# Patient Record
Sex: Female | Born: 1960 | Race: White | Hispanic: No | Marital: Single | Smoking: Never smoker
Health system: Southern US, Community
[De-identification: ages and names within clinical notes are randomized; demographics above are authoritative.]

## PROBLEM LIST (undated history)

## (undated) DIAGNOSIS — T4145XA Adverse effect of unspecified anesthetic, initial encounter: Secondary | ICD-10-CM

## (undated) DIAGNOSIS — M1990 Unspecified osteoarthritis, unspecified site: Secondary | ICD-10-CM

## (undated) DIAGNOSIS — Z8489 Family history of other specified conditions: Secondary | ICD-10-CM

## (undated) DIAGNOSIS — I82409 Acute embolism and thrombosis of unspecified deep veins of unspecified lower extremity: Secondary | ICD-10-CM

## (undated) DIAGNOSIS — N926 Irregular menstruation, unspecified: Secondary | ICD-10-CM

## (undated) DIAGNOSIS — Z8619 Personal history of other infectious and parasitic diseases: Secondary | ICD-10-CM

## (undated) DIAGNOSIS — F329 Major depressive disorder, single episode, unspecified: Secondary | ICD-10-CM

## (undated) DIAGNOSIS — R112 Nausea with vomiting, unspecified: Secondary | ICD-10-CM

## (undated) DIAGNOSIS — J45909 Unspecified asthma, uncomplicated: Secondary | ICD-10-CM

## (undated) DIAGNOSIS — L732 Hidradenitis suppurativa: Secondary | ICD-10-CM

## (undated) DIAGNOSIS — N951 Menopausal and female climacteric states: Secondary | ICD-10-CM

## (undated) DIAGNOSIS — B379 Candidiasis, unspecified: Secondary | ICD-10-CM

## (undated) DIAGNOSIS — I10 Essential (primary) hypertension: Secondary | ICD-10-CM

## (undated) DIAGNOSIS — L292 Pruritus vulvae: Secondary | ICD-10-CM

## (undated) DIAGNOSIS — Z9889 Other specified postprocedural states: Secondary | ICD-10-CM

## (undated) DIAGNOSIS — K219 Gastro-esophageal reflux disease without esophagitis: Secondary | ICD-10-CM

## (undated) DIAGNOSIS — C801 Malignant (primary) neoplasm, unspecified: Secondary | ICD-10-CM

## (undated) DIAGNOSIS — T8859XA Other complications of anesthesia, initial encounter: Secondary | ICD-10-CM

## (undated) DIAGNOSIS — F419 Anxiety disorder, unspecified: Secondary | ICD-10-CM

## (undated) DIAGNOSIS — F32A Depression, unspecified: Secondary | ICD-10-CM

## (undated) HISTORY — DX: Essential (primary) hypertension: I10

## (undated) HISTORY — DX: Adverse effect of unspecified anesthetic, initial encounter: T41.45XA

## (undated) HISTORY — DX: Personal history of other infectious and parasitic diseases: Z86.19

## (undated) HISTORY — DX: Pruritus vulvae: L29.2

## (undated) HISTORY — DX: Other complications of anesthesia, initial encounter: T88.59XA

## (undated) HISTORY — DX: Candidiasis, unspecified: B37.9

## (undated) HISTORY — DX: Irregular menstruation, unspecified: N92.6

## (undated) HISTORY — PX: TONSILLECTOMY: SUR1361

## (undated) HISTORY — DX: Menopausal and female climacteric states: N95.1

## (undated) HISTORY — DX: Hidradenitis suppurativa: L73.2

## (undated) HISTORY — PX: EYE MUSCLE SURGERY: SHX370

## (undated) HISTORY — PX: WISDOM TOOTH EXTRACTION: SHX21

---

## 2009-06-25 ENCOUNTER — Telehealth: Payer: Self-pay | Admitting: Internal Medicine

## 2009-06-27 DIAGNOSIS — I10 Essential (primary) hypertension: Secondary | ICD-10-CM | POA: Insufficient documentation

## 2009-06-28 ENCOUNTER — Encounter: Payer: Self-pay | Admitting: Nurse Practitioner

## 2009-06-28 ENCOUNTER — Ambulatory Visit: Payer: Self-pay | Admitting: Internal Medicine

## 2009-06-28 DIAGNOSIS — R1013 Epigastric pain: Secondary | ICD-10-CM | POA: Insufficient documentation

## 2009-06-28 DIAGNOSIS — R110 Nausea: Secondary | ICD-10-CM | POA: Insufficient documentation

## 2009-06-28 DIAGNOSIS — E669 Obesity, unspecified: Secondary | ICD-10-CM | POA: Insufficient documentation

## 2009-06-28 DIAGNOSIS — R197 Diarrhea, unspecified: Secondary | ICD-10-CM | POA: Insufficient documentation

## 2009-06-28 DIAGNOSIS — K219 Gastro-esophageal reflux disease without esophagitis: Secondary | ICD-10-CM | POA: Insufficient documentation

## 2009-07-01 LAB — CONVERTED CEMR LAB
Albumin: 3.7 g/dL (ref 3.5–5.2)
Alkaline Phosphatase: 65 units/L (ref 39–117)
BUN: 10 mg/dL (ref 6–23)
Basophils Relative: 0 % (ref 0.0–3.0)
CO2: 29 meq/L (ref 19–32)
Calcium: 9.4 mg/dL (ref 8.4–10.5)
Chloride: 97 meq/L (ref 96–112)
Eosinophils Relative: 0.3 % (ref 0.0–5.0)
Glucose, Bld: 99 mg/dL (ref 70–99)
Lipase: 16 units/L (ref 11.0–59.0)
Lymphocytes Relative: 16.6 % (ref 12.0–46.0)
Monocytes Absolute: 0.6 10*3/uL (ref 0.1–1.0)
Monocytes Relative: 5.8 % (ref 3.0–12.0)
Neutrophils Relative %: 77.3 % — ABNORMAL HIGH (ref 43.0–77.0)
Platelets: 342 10*3/uL (ref 150.0–400.0)
Potassium: 3.5 meq/L (ref 3.5–5.1)
RBC: 4.52 M/uL (ref 3.87–5.11)
Sodium: 135 meq/L (ref 135–145)
Total Protein: 7.2 g/dL (ref 6.0–8.3)
WBC: 10.7 10*3/uL — ABNORMAL HIGH (ref 4.5–10.5)

## 2009-08-13 ENCOUNTER — Ambulatory Visit: Payer: Self-pay | Admitting: Internal Medicine

## 2009-08-13 ENCOUNTER — Ambulatory Visit (HOSPITAL_COMMUNITY): Admission: RE | Admit: 2009-08-13 | Discharge: 2009-08-13 | Payer: Self-pay | Admitting: Internal Medicine

## 2009-08-14 ENCOUNTER — Encounter: Payer: Self-pay | Admitting: Internal Medicine

## 2009-11-12 ENCOUNTER — Emergency Department (HOSPITAL_COMMUNITY): Admission: EM | Admit: 2009-11-12 | Discharge: 2009-11-12 | Payer: Self-pay | Admitting: Emergency Medicine

## 2010-03-19 ENCOUNTER — Encounter: Payer: Self-pay | Admitting: Internal Medicine

## 2010-06-20 ENCOUNTER — Encounter
Admission: RE | Admit: 2010-06-20 | Discharge: 2010-06-20 | Payer: Self-pay | Source: Home / Self Care | Attending: Internal Medicine | Admitting: Internal Medicine

## 2010-07-02 DIAGNOSIS — N926 Irregular menstruation, unspecified: Secondary | ICD-10-CM

## 2010-07-02 HISTORY — DX: Irregular menstruation, unspecified: N92.6

## 2010-08-12 NOTE — Miscellaneous (Signed)
Summary: Nexium Refills  Clinical Lists Changes  Medications: Changed medication from NEXIUM 40 MG CPDR (ESOMEPRAZOLE MAGNESIUM) 1 by mouth once daily to NEXIUM 40 MG CPDR (ESOMEPRAZOLE MAGNESIUM) 1 by mouth once daily AS NEEDED - Signed Rx of NEXIUM 40 MG CPDR (ESOMEPRAZOLE MAGNESIUM) 1 by mouth once daily AS NEEDED;  #30 x 2;  Signed;  Entered by: Lamona Curl CMA (AAMA);  Authorized by: Hart Carwin MD;  Method used: Electronically to [facility]. #[id number]*, [address], [place], [place], [place]  [address], Ph: [phone], Fax: [phone]    Prescriptions: NEXIUM 40 MG CPDR (ESOMEPRAZOLE MAGNESIUM) 1 by mouth once daily AS NEEDED  #30 x 2   Entered by:   Lamona Curl CMA (AAMA)   Authorized by:   Hart Carwin MD   Signed by:   Lamona Curl CMA (AAMA) on 03/19/2010   Method used:   Electronically to        [facility]. [phone]* (retail)       [address]       [place], [place]  [address]       Ph: [phone]       Fax: [phone]   RxID:   [id number]

## 2010-08-12 NOTE — Letter (Signed)
Summary: Patient [facility] Biopsy Results  [facility]  [address] [place], [place] [address]   Phone: [phone]  Fax: [phone]        August 14, 2009 MRN: [id number]    [facility] [address] [place], [place]  [address]    Dear Ms. Lyter,  I am pleased to inform you that the biopsies taken during your recent endoscopic examination did not show any evidence of cancer upon pathologic examination. The stomach biopsies show mild gastritis ( inflammation)  Additional information/recommendations:  __No further action is needed at this time.  Please follow-up with      your primary care physician for your other healthcare needs.  __ Please call [phone] to schedule a return visit to review      your condition.  __x Continue with the treatment plan as outlined on the day of your      exam.  _   Please call us if you are having persistent problems or have questions about your condition that have not been fully answered at this time.  Sincerely,  Hart Carwin MD  This letter has been electronically signed by your physician.  Appended Document: Patient Notice-Endo Biopsy Results Letter mailed to patient.

## 2010-08-12 NOTE — Procedures (Signed)
Summary: Upper Endoscopy  Patient: Kendra Haney Note: All result statuses are Final unless otherwise noted.  Tests: (1) Upper Endoscopy (EGD)   EGD Upper Endoscopy       DONE     [facility]     [address] [place], [place]  [address]           ENDOSCOPY PROCEDURE REPORT           PATIENT:  Kendra, Haney  MR#:  [id number]     BIRTHDATE:  [birth year], 48 yrs. old  GENDER:  female           ENDOSCOPIST:  Hedwig Morton. Juanda Chance, MD     Referred by:  Louanna Raw, [name]           PROCEDURE DATE:  08/13/2009     PROCEDURE:  EGD with biopsy     ASA CLASS:  Class I     INDICATIONS:  abdominal pain epigastric pain x 1 year, refractory     to PPI's, now relieved with Nexiem, abd. sono negative           MEDICATIONS:   Versed 10 mg, Fentanyl 100 mcg     TOPICAL ANESTHETIC:  Cetacaine Spray           DESCRIPTION OF PROCEDURE:   After the risks benefits and     alternatives of the procedure were thoroughly explained, informed     consent was obtained.  The EG-[id number] ([id number]) endoscope was     introduced through the mouth and advanced to the second portion of     the duodenum, without limitations.  The instrument was slowly     withdrawn as the mucosa was fully examined.     <<PROCEDUREIMAGES>>           There were multiple polyps identified. multiple fundic gland     polyps Multiple biopsies were obtained and sent to pathology (see     image005).  Mild gastritis was found in the antrum. scattered     prepyloric erosions With standard forceps, a biopsy was obtained     and sent to pathology (see image002).  Otherwise the examination     was normal (see image006, image004, image003, and image001).     Retroflexed views revealed no abnormalities.    The scope was then     withdrawn from the patient and the procedure completed.     COMPLICATIONS:  None           ENDOSCOPIC IMPRESSION:     1) Polyps, multiple     2) Mild gastritis in the antrum     3) Otherwise normal  examination     RECOMMENDATIONS:     1) await biopsy results     continue Nexiwm 40 mg po qd, may try to stop after several month     and take it prn           REPEAT EXAM:  In 0 year(s) for.           ______________________________     Hedwig Morton. Juanda Chance, MD           CC:           n.     eSIGNED:   Hedwig Morton. Kamesha Herne at 08/13/2009 10:22 AM           [name], [id number]  Note: An exclamation mark (!) indicates a result that was not dispersed into the  flowsheet. Document Creation Date: 08/13/2009 10:22 AM _______________________________________________________________________  (1) Order result status: Final Collection or observation date-time: 08/13/2009 10:16 Requested date-time:  Receipt date-time:  Reported date-time:  Referring Physician:   Ordering Physician: Lina Sar [phone]) Specimen Source:  Source: Launa Grill Order Number: [id number] Lab site:

## 2010-09-30 LAB — BASIC METABOLIC PANEL
CO2: 29 mEq/L (ref 19–32)
Chloride: 102 mEq/L (ref 96–112)
GFR calc Af Amer: >60 mL/min (ref >60)
Glucose, Bld: 103 mg/dL — ABNORMAL HIGH (ref 70–99)
Sodium: 139 mEq/L (ref 135–145)

## 2010-09-30 LAB — DIFFERENTIAL
Basophils Relative: 0 % (ref 0–1)
Eosinophils Absolute: 0 10*3/uL (ref 0.0–0.7)
Eosinophils Relative: 0 % (ref 0–5)
Monocytes Absolute: 0.5 10*3/uL (ref 0.1–1.0)
Monocytes Relative: 4 % (ref 3–12)

## 2010-09-30 LAB — CBC
Hemoglobin: 13.4 g/dL (ref 12.0–15.0)
MCHC: 33.3 g/dL (ref 30.0–36.0)
MCV: 86.6 fL (ref 78.0–100.0)
RBC: 4.65 MIL/uL (ref 3.87–5.11)

## 2010-09-30 LAB — URINALYSIS, ROUTINE W REFLEX MICROSCOPIC
Bilirubin Urine: NEGATIVE
Glucose, UA: NEGATIVE mg/dL
Hgb urine dipstick: NEGATIVE
Protein, ur: NEGATIVE mg/dL
Specific Gravity, Urine: 1.017 (ref 1.005–1.030)

## 2010-10-14 DIAGNOSIS — N951 Menopausal and female climacteric states: Secondary | ICD-10-CM

## 2010-10-14 HISTORY — DX: Menopausal and female climacteric states: N95.1

## 2010-12-03 ENCOUNTER — Other Ambulatory Visit: Payer: Self-pay | Admitting: *Deleted

## 2010-12-03 MED ORDER — ESOMEPRAZOLE MAGNESIUM 40 MG PO CPDR: 40.0000 mg | DELAYED_RELEASE_CAPSULE | Freq: Every day | ORAL | Status: DC

## 2011-03-18 ENCOUNTER — Ambulatory Visit: Payer: Self-pay | Admitting: Internal Medicine

## 2011-04-14 DIAGNOSIS — L732 Hidradenitis suppurativa: Secondary | ICD-10-CM

## 2011-04-14 DIAGNOSIS — L292 Pruritus vulvae: Secondary | ICD-10-CM

## 2011-04-14 HISTORY — DX: Hidradenitis suppurativa: L73.2

## 2011-04-14 HISTORY — DX: Pruritus vulvae: L29.2

## 2011-05-13 ENCOUNTER — Other Ambulatory Visit: Payer: Self-pay | Admitting: Internal Medicine

## 2011-05-13 NOTE — Telephone Encounter (Signed)
rx sent

## 2011-10-14 ENCOUNTER — Other Ambulatory Visit: Payer: Self-pay

## 2011-10-14 DIAGNOSIS — N921 Excessive and frequent menstruation with irregular cycle: Secondary | ICD-10-CM

## 2011-11-09 ENCOUNTER — Other Ambulatory Visit: Payer: Self-pay | Admitting: Obstetrics and Gynecology

## 2011-11-09 ENCOUNTER — Other Ambulatory Visit: Payer: Self-pay

## 2011-11-09 ENCOUNTER — Ambulatory Visit (INDEPENDENT_AMBULATORY_CARE_PROVIDER_SITE_OTHER): Payer: 59 | Admitting: Obstetrics and Gynecology

## 2011-11-09 ENCOUNTER — Encounter: Payer: Self-pay | Admitting: Obstetrics and Gynecology

## 2011-11-09 ENCOUNTER — Ambulatory Visit (INDEPENDENT_AMBULATORY_CARE_PROVIDER_SITE_OTHER): Payer: 59

## 2011-11-09 VITALS — BP 120/76 | Ht 70.0 in | Wt 360.0 lb

## 2011-11-09 DIAGNOSIS — N943 Premenstrual tension syndrome: Secondary | ICD-10-CM

## 2011-11-09 DIAGNOSIS — N921 Excessive and frequent menstruation with irregular cycle: Secondary | ICD-10-CM

## 2011-11-09 NOTE — Progress Notes (Signed)
Pt states bleeding has improved.  She still occ has anxiety BP 120/76  Ht 5\' 10"  (1.778 m)  Wt 360 lb (163.295 kg)  BMI 51.65 kg/m2  LMP 10/26/2011 Physical Examination: General appearance - alert, well appearing, and in no distress Chest - clear to auscultation, no wheezes, rales or rhonchi, symmetric air entry Heart - normal rate, regular rhythm, normal S1, S2, no murmurs, rubs, clicks or gallops Abdomen - soft, nontender, nondistended, no masses or organomegaly Musculoskeletal - no joint tenderness, deformity or swelling Extremities - peripheral pulses normal, no pedal edema, no clubbing or cyanosis PMS H/o irregular bleeding Pt doing well on OCPS US WNL Follow up with PCP about anxiety Diet and exercise discussed

## 2011-12-01 ENCOUNTER — Other Ambulatory Visit: Payer: Self-pay | Admitting: Internal Medicine

## 2011-12-22 ENCOUNTER — Telehealth: Payer: Self-pay | Admitting: Obstetrics and Gynecology

## 2011-12-22 NOTE — Telephone Encounter (Signed)
Triage/epic 

## 2011-12-22 NOTE — Telephone Encounter (Signed)
TC from pt.   States had been diagnosed with DVT today, having 3 clots.  Unsure if due to OCP's or recent injections in knee.  Has D/C'd OCP.   States was taking for perimenopausal sx and irreg menses.   Taking continuously x 3 months.  Questioning if will have heavier menses since will be on blood thinner, and what she can take for menopausal sx.   Per DR ND, informed may have heavier menses.   To Call if bleeding > pad/hr.  Sched w/Dr ND 12/28/11. Pt verbalizes comprehension.

## 2011-12-28 ENCOUNTER — Encounter: Payer: Self-pay | Admitting: Obstetrics and Gynecology

## 2011-12-28 ENCOUNTER — Ambulatory Visit (INDEPENDENT_AMBULATORY_CARE_PROVIDER_SITE_OTHER): Payer: 59 | Admitting: Obstetrics and Gynecology

## 2011-12-28 VITALS — BP 130/76 | Wt 352.0 lb

## 2011-12-28 DIAGNOSIS — N943 Premenstrual tension syndrome: Secondary | ICD-10-CM

## 2011-12-28 DIAGNOSIS — I82409 Acute embolism and thrombosis of unspecified deep veins of unspecified lower extremity: Secondary | ICD-10-CM

## 2011-12-28 DIAGNOSIS — O2230 Deep phlebothrombosis in pregnancy, unspecified trimester: Secondary | ICD-10-CM

## 2011-12-28 NOTE — Progress Notes (Signed)
Pt states she stopped taking bc pills due to blood clots in her legs and would like to discuss other options for hormone balance.  Pt was on ocps to control DUB and PMS.  Last week she was diagnosed with a DVT.  She denied having any recent travel, h/o VTE, or leg trauma Pt also concerned about 4 pound weight gain in the last week.   Physical Examination: LE right calf with erythema and swelling.  Left leg with mild swelling.   PMS Right DVT DUB Swelling and fluid retention Pt told she should not take hormones.  We reviewed herbal remedies and exerise for PMS Pt on lovenox and coumadin.   Pt has not had any irregular bleeding.  She was told she could try Mirena if okd by hematology, ablation or hysterectomy Pt chose observation for now I spoke with Dr Allyne Gee.  She recommends the pt take HCTZ QD. Pt declined hematology consult at this time

## 2011-12-28 NOTE — Patient Instructions (Signed)
Patient Education Materials to be provided at check out (*indicates is located in accordion folder):  *Mirena, Endometrial Ablation, Herbal Products

## 2012-01-05 ENCOUNTER — Telehealth: Payer: Self-pay | Admitting: Hematology and Oncology

## 2012-01-05 NOTE — Telephone Encounter (Signed)
S/w pt re appt for 7/9. °

## 2012-01-07 ENCOUNTER — Telehealth: Payer: Self-pay | Admitting: Hematology and Oncology

## 2012-01-07 NOTE — Telephone Encounter (Signed)
Referred by Bebe Liter, FNP, Dx-Coag W/U

## 2012-01-19 ENCOUNTER — Encounter: Payer: Self-pay | Admitting: Hematology and Oncology

## 2012-01-19 ENCOUNTER — Ambulatory Visit (HOSPITAL_BASED_OUTPATIENT_CLINIC_OR_DEPARTMENT_OTHER): Payer: Self-pay | Admitting: Lab

## 2012-01-19 ENCOUNTER — Ambulatory Visit (HOSPITAL_BASED_OUTPATIENT_CLINIC_OR_DEPARTMENT_OTHER): Payer: Self-pay | Admitting: Hematology and Oncology

## 2012-01-19 ENCOUNTER — Ambulatory Visit: Payer: Self-pay

## 2012-01-19 VITALS — BP 131/59 | HR 78 | Temp 97.1°F | Ht 70.0 in | Wt 351.7 lb

## 2012-01-19 DIAGNOSIS — Z7901 Long term (current) use of anticoagulants: Secondary | ICD-10-CM

## 2012-01-19 DIAGNOSIS — I82409 Acute embolism and thrombosis of unspecified deep veins of unspecified lower extremity: Secondary | ICD-10-CM

## 2012-01-19 LAB — CBC WITH DIFFERENTIAL/PLATELET
BASO%: 0.3 % (ref 0.0–2.0)
EOS%: 0.8 % (ref 0.0–7.0)
HGB: 11.9 g/dL (ref 11.6–15.9)
MCH: 27.6 pg (ref 25.1–34.0)
MCHC: 32.6 g/dL (ref 31.5–36.0)
MONO#: 0.4 10*3/uL (ref 0.1–0.9)
RDW: 13.6 % (ref 11.2–14.5)
WBC: 7.3 10*3/uL (ref 3.9–10.3)
lymph#: 2 10*3/uL (ref 0.9–3.3)

## 2012-01-19 LAB — PROTIME-INR
INR: 2.2 (ref 2.00–3.50)
Protime: 26.4 Seconds — ABNORMAL HIGH (ref 10.6–13.4)

## 2012-01-19 NOTE — Progress Notes (Signed)
CC:   Robyn N. Allyne Gee, [name]  IDENTIFYING STATEMENT:  The patient is a 51 year old woman seen at request of Dr. Allyne Gee with deep vein thrombosis.  HISTORY OF PRESENT ILLNESS:  The patient reports that early last month she had woken up with redness and swelling of her right lower leg.  She was evaluated by Dr. Allyne Gee and on 12/22/2011 a lower extremity Doppler, which I do not have results of, had documented deep vein thrombosis.  The patient tells me that she had labs drawn and was placed on Lovenox twice daily and subsequently bridged to Coumadin.  The patient notes that whilst on Lovenox, swelling and redness had decreased.  However, more lately she noted recurrence of some of her symptoms.  She tells me that INR has been erratic, swinging from highs to lows despite adjustments.  She has not had any evidence of bleeding. She denies chest pain, shortness of breath or lightheadedness.  She was on the oral contraceptive pill for a year and a half for premenopausal symptoms and dysfunctional uterine bleeding.  This has since been discontinued.  Her risk factors also include obesity and immobility. Denies a past history or family history for thrombosis.  She had what appears to be a limited hypercoagulable panel performed at Dr. Kellie Moor office which essentially was unremarkable.  She is attempting to lose weight and has lost 100 pounds over a 2 year period.  She denies rectal Bleeding or changes in bowel function.  PAST MEDICAL HISTORY: 1. Has status post repair of left strabismus. 2. Status post tonsillectomy. 3. Hypertension.  ALLERGIES:  Doxycycline and tetracycline.  MEDICATIONS: 1. Xanax 1 mg as needed. 2. Tylenol. 3. Wellbutrin 150 mg 2 tablets daily. 4. BuSpar 50 mg 3 times daily. 5. Nexium 40 mg daily. 6. Allegra 180 mg daily. 7. Flonase 50 mcg 2 sprays in nostrils as needed. 8. Microzide 12.5 mg daily. 9. Vicodin 1 tablet q.6 p.r.n. daily. 10.Avalide 300/12.5 one  tablet daily. 11.Desyrel 100 mg as needed. 12.Coumadin dosed at Dr. Kellie Moor office.  SOCIAL HISTORY:  The patient is single.  She does not have children. Denies alcohol, tobacco use.  She works with Psychologist, forensic.  FAMILY HISTORY:  The patient's father had carcinoma of bile ducts. Negative family history for thrombosis.  REVIEW OF SYSTEMS:  Denies fever, chills, night sweats, anorexia, weight loss.  Cardiovascular:  Denies chest pain, PND, orthopnea, ankle swelling.  Respiratory:  Denies cough, hemoptysis, wheeze, shortness of breath.  GI:  Denies nausea, vomiting, abdominal pain, diarrhea, melena, hematochezia.  GU:  Denies dysuria, nocturia, frequency.  Skin:  No bleeding or bruising.  Neurologic:  Denies headaches, vision changes, extremity weakness.  The rest of review of systems negative except for minimal right lower extremity swelling with discomfort on ambulation.  PHYSICAL EXAM:  General:  The patient is a well-appearing, well- nourished woman in no current distress.  Vitals:  Pulse 78, blood pressure 131/59, temperature 97.1, respirations 20, weight is 351.7 pounds.  HEENT:  Head is atraumatic, normocephalic.  Sclerae anicteric. Mouth moist.  Neck:  Supple.  Chest:  Clear to percussion and auscultation.  CVS:  First and second heart sounds present with no added sounds or murmurs.  Abdomen:  Obese, soft.  No palpable hepatomegaly. Bowel sounds present.  Extremities:  No edema.  Pulses present and symmetrical.  Skin:  No bruising.  CNS:  Nonfocal.  No palpable adenopathy.  IMPRESSION AND PLAN:  Ms. Siddoway is a 51 year old woman who was recently diagnosed with  a provoked right lower extremity deep vein thrombosis on 12/22/2011 following Doppler studies.  She was commenced on Lovenox which was bridged to Coumadin. Presently remains on coumadin. INRs have been erratic.  Her risk factors includes hormonal therapy which has since discontinued, inactive lifestyle, and  obesity.  As a result of her subtherapeutic INRs and the fact that she has had recurrence of some of her symptoms with right lower extremity swelling I think she would be a candidate for Xarelto.  She does not have any forms of active pathological bleeding.  RECOMMENDATIONS:  A starting dose of 15 mg twice daily for 2 weeks followed by 20 mg once daily for total of 6 months.  If she has financial constraints we would have her go back on Lovenox 1.5 mg/kg daily with Coumadin discontinuing Lovenox when INR remains stable with a therapeutic goal between 2 and 3.  After 6 months of anticoagulation would repeat another Doppler.  If negative would discontinue Coumadin and after a period of 3 weeks obtain a full hypercoagulable panel to evaluate hereditary component.  I am quite happy to do that at this office.  In the interim, the patient will return to lab and will check a CBC, CMET and coagulation panel.  Will have our patient coordinator see if she is able to afford Xarelto.  If so, will write a prescription.  She will follow up with Dr. Allyne Gee within the next week or so.  She plans to follow back with [place] in 6 months' time.    ______________________________ Laurice Record, [name] LIO/MEDQ  D:  01/19/2012  T:  01/19/2012  Job:  [id number]

## 2012-01-19 NOTE — Patient Instructions (Signed)
Kendra Haney  [id number]  [facility] Discharge Instructions  RECOMMENDATIONS MADE BY THE CONSULTANT AND ANY TEST RESULTS WILL BE SENT TO YOUR REFERRING DOCTOR.   EXAM FINDINGS BY MD TODAY AND SIGNS AND SYMPTOMS TO REPORT TO CLINIC OR PRIMARY MD:   Your current list of medications are: Current Outpatient Prescriptions  Medication Sig Dispense Refill  . acetaminophen (TYLENOL) 500 MG tablet Take 500 mg by mouth every 6 (six) hours as needed.      Marland Kitchen adapalene (DIFFERIN) 0.1 % cream Apply topically at bedtime.      . ALPRAZolam (XANAX) 1 MG tablet Take 1 mg by mouth as needed.      Marland Kitchen b complex vitamins tablet Take 1 tablet by mouth daily.      Marland Kitchen buPROPion (WELLBUTRIN XL) 150 MG 24 hr tablet Take 150 mg by mouth daily. 01/19/12- Pt takes 3 tabs daily.      . busPIRone (BUSPAR) 15 MG tablet Take 15 mg by mouth 3 (three) times daily.      . fexofenadine (ALLEGRA) 180 MG tablet Take 180 mg by mouth daily.      . fluticasone (FLONASE) 50 MCG/ACT nasal spray Place 2 sprays into the nose as needed.      . hydrochlorothiazide (MICROZIDE) 12.5 MG capsule Take 12.5 mg by mouth daily.      . Hydrocodone-Acetaminophen 5-300 MG TABS Take by mouth as needed.      . irbesartan-hydrochlorothiazide (AVALIDE) 300-12.5 MG per tablet Take 1 tablet by mouth daily.      Marland Kitchen NEXIUM 40 MG capsule TAKE 1 CAPSULE DAILY  90 capsule  1  . traZODone (DESYREL) 100 MG tablet Take 100 mg by mouth as needed.      . WARFARIN SODIUM PO Take by mouth daily. As directed         INSTRUCTIONS GIVEN AND DISCUSSED:   SPECIAL INSTRUCTIONS/FOLLOW-UP:  See above.  I acknowledge that I have been informed and understand all the instructions given to me and received a copy. I do not have any more questions at this time, but understand that I may call the [facility] at [phone] during business hours should I have any further questions or need assistance in obtaining follow-up care.

## 2012-01-19 NOTE — Progress Notes (Signed)
This office note has been dictated.

## 2012-01-19 NOTE — Progress Notes (Signed)
Patient came in today as a new patient and she has [employer] insurance,she said she think she will be [place] as far as financial assistance.I did give her Terald Sleeper card just in case she had any questions she can call her.

## 2012-01-21 ENCOUNTER — Other Ambulatory Visit: Payer: Self-pay | Admitting: *Deleted

## 2012-01-21 DIAGNOSIS — I82409 Acute embolism and thrombosis of unspecified deep veins of unspecified lower extremity: Secondary | ICD-10-CM

## 2012-01-21 MED ORDER — RIVAROXABAN 15 MG PO TABS: 15.0000 mg | ORAL_TABLET | Freq: Two times a day (BID) | ORAL | Status: DC

## 2012-01-21 MED ORDER — RIVAROXABAN 20 MG PO TABS: 20.0000 mg | ORAL_TABLET | Freq: Every day | ORAL | Status: DC

## 2012-01-22 NOTE — Progress Notes (Signed)
Sent Rx for Xarelto 15 mg to [facility]. They will have it by 01/20/12.  I called [facility] [phone] and spoke to Kiana and no pre-cert is needed.

## 2012-01-25 ENCOUNTER — Telehealth: Payer: Self-pay | Admitting: Hematology and Oncology

## 2012-01-25 ENCOUNTER — Other Ambulatory Visit: Payer: Self-pay | Admitting: *Deleted

## 2012-01-25 NOTE — Telephone Encounter (Signed)
s/w pt and moved 8/1 appt to am per dr lo,pt aware  aom

## 2012-01-26 ENCOUNTER — Telehealth: Payer: Self-pay | Admitting: *Deleted

## 2012-01-26 NOTE — Telephone Encounter (Signed)
Faxed response from Dr. Dalene Carrow  For clarification re:  Pt to take  Xarelto   15 mg  BID  For  3  Weeks  To  [facility] Fax    [phone] .

## 2012-02-04 ENCOUNTER — Telehealth: Payer: Self-pay | Admitting: *Deleted

## 2012-02-04 ENCOUNTER — Other Ambulatory Visit: Payer: Self-pay | Admitting: *Deleted

## 2012-02-04 ENCOUNTER — Ambulatory Visit (HOSPITAL_BASED_OUTPATIENT_CLINIC_OR_DEPARTMENT_OTHER): Payer: Self-pay | Admitting: Lab

## 2012-02-04 DIAGNOSIS — I82409 Acute embolism and thrombosis of unspecified deep veins of unspecified lower extremity: Secondary | ICD-10-CM

## 2012-02-04 LAB — CBC WITH DIFFERENTIAL/PLATELET
BASO%: 0.1 % (ref 0.0–2.0)
Basophils Absolute: 0 10*3/uL (ref 0.0–0.1)
EOS%: 0.7 % (ref 0.0–7.0)
MCH: 27.3 pg (ref 25.1–34.0)
MCHC: 32.8 g/dL (ref 31.5–36.0)
MCV: 83.4 fL (ref 79.5–101.0)
MONO%: 6.3 % (ref 0.0–14.0)
RBC: 4.21 10*6/uL (ref 3.70–5.45)
RDW: 13.7 % (ref 11.2–14.5)

## 2012-02-04 NOTE — Telephone Encounter (Signed)
Received call from pt informing nurse re: pt has been taking Xarelto 15 mg BID  Since  01/19/12.    Pt noticed red rectal bleeding when wiped and small clot.   Pt stated she is also on her period but pt uses tampons.   Pt is concerned. Spoke with Herbert Seta, pharmacist.  Per  Herbert Seta,  Pt can continue to monitor for any more bleeding episodes.  If bleeding continues, pt should go to ER for evaluation - since no meds to reverse Xarelto effects.    Dr. Arline Asp notified. Pt's  Work  [employer]   [phone].

## 2012-02-04 NOTE — Telephone Encounter (Signed)
Spoke with pt and informed pt re:  Per Dr. Arline Asp,  Pt needs to have CBC rechecked.   Pt informed nurse that she thought bleeding was from her heavy period - since pt had gotten off hormone meds.  Pt stated she was not that concerned now but will come in for lab as instructed.  Gave pt appt date and time for lab today at 2 pm.  Pt voiced understanding.

## 2012-02-11 ENCOUNTER — Ambulatory Visit (HOSPITAL_BASED_OUTPATIENT_CLINIC_OR_DEPARTMENT_OTHER): Payer: 59 | Admitting: Family

## 2012-02-11 ENCOUNTER — Telehealth: Payer: Self-pay | Admitting: Hematology and Oncology

## 2012-02-11 ENCOUNTER — Encounter: Payer: Self-pay | Admitting: Family

## 2012-02-11 VITALS — BP 110/64 | HR 67 | Temp 97.5°F | Ht 70.0 in | Wt 350.5 lb

## 2012-02-11 DIAGNOSIS — I82409 Acute embolism and thrombosis of unspecified deep veins of unspecified lower extremity: Secondary | ICD-10-CM

## 2012-02-11 NOTE — Patient Instructions (Signed)
Patient ID: Kendra Haney,   DOB: [birth year],  MRN: [id number]   [facility] Discharge Instructions  RECOMMENDATIONS MAD BY THE CONSULTANT AND ANY TEST RESULT(S) WILL BE FORWARDED TO YOU REFERRING DOCTOR   EXAM FINDINGS BY NURSE PRACTITIONER TODAY TO REPORT TO THE CLINIC OR PRIMARY PROVIDER: N/A   Your Current Medications Are: Current Outpatient Prescriptions  Medication Sig Dispense Refill  . acetaminophen (TYLENOL) 500 MG tablet Take 500 mg by mouth every 6 (six) hours as needed.      Marland Kitchen adapalene (DIFFERIN) 0.1 % cream Apply topically at bedtime.      . ALPRAZolam (XANAX) 1 MG tablet Take 1 mg by mouth as needed.      Marland Kitchen b complex vitamins tablet Take 1 tablet by mouth daily.      Marland Kitchen buPROPion (WELLBUTRIN XL) 150 MG 24 hr tablet Take 150 mg by mouth daily. 01/19/12- Pt takes 3 tabs daily.      . busPIRone (BUSPAR) 15 MG tablet Take 15 mg by mouth 3 (three) times daily.      . fexofenadine (ALLEGRA) 180 MG tablet Take 180 mg by mouth daily.      . fluticasone (FLONASE) 50 MCG/ACT nasal spray Place 2 sprays into the nose as needed.      . hydrochlorothiazide (MICROZIDE) 12.5 MG capsule Take 12.5 mg by mouth daily.      . Hydrocodone-Acetaminophen 5-300 MG TABS Take by mouth as needed.      . irbesartan-hydrochlorothiazide (AVALIDE) 300-12.5 MG per tablet Take 1 tablet by mouth daily.      Marland Kitchen NEXIUM 40 MG capsule TAKE 1 CAPSULE DAILY  90 capsule  1  . Rivaroxaban (XARELTO) 20 MG TABS Take 1 tablet (20 mg total) by mouth daily. Start  After  15 mg  BID  X  3  Weeks.  30 tablet  5  . traZODone (DESYREL) 100 MG tablet Take 100 mg by mouth as needed.      Marland Kitchen DISCONTD: Rivaroxaban (XARELTO) 15 MG TABS tablet Take 1 tablet (15 mg total) by mouth 2 (two) times daily. Take 15 mg po  BID  For  3  Weeks..  42 tablet  0     INSTRUCTIONS GIVEN, DISCUSSED AND FOLLOW-UP: If driving to [place] stop often to move your legs and increase circulation.  Consider purchasing and wearing compression  stockings for long trips (at least knee length, hopefully thigh length).  I acknowledge that I have been informed and understand all the instructions given to me and have received a copy.  I do not have any further questions at this time, but I understand that I may call the [facility] at [phone] during business hours should I have any further questions or need assistance in obtaining follow-up care.   02/11/2012, 9:27 AM

## 2012-02-11 NOTE — Progress Notes (Signed)
Patient ID: Kendra Haney, female   DOB: 1961-04-21, 51 y.o.   MRN: [id number] CSN: [id number]  [name], [name]  Identifying Statement: Kendra Haney is a 51 y.o. Caucasion female who presents for follow-up of RLE deep vein thrombosis.   Interval History: The patient reports that she is doing well and the pain in her RLE has subsided.  The patient states that she is taking Xarelto and just switched to the 20 mg dosage yesterday.  The patient states that she has ongoing bilateral knee pain, but she is working with an orthopedist for this issue and is aware that she will most likely need bilateral knee replacements.  The patient is planning a driving trip to see her mother in [place] soon.  Explained to the patient that compression stockings and frequent rest stops to keep circulation flowing would be a good idea.   The patient asked about any drug interactions between Xarelto and [place]ra [place] - none noted by Kendra Haney.  The patient stated that she has been having dental pain where she has a crown and may need to have dental work completed soon.  Asked the patient to notify our office if a dental procedure is scheduled so that anti-coagulation therapy can be discussed.  The patient does not have any further complaints and denies any symptomatology.  Medications: Current Outpatient Prescriptions  Medication Sig Dispense Refill  . acetaminophen (TYLENOL) 500 MG tablet Take 500 mg by mouth every 6 (six) hours as needed.      Marland Kitchen adapalene (DIFFERIN) 0.1 % cream Apply topically at bedtime.      . ALPRAZolam (XANAX) 1 MG tablet Take 1 mg by mouth as needed.      Marland Kitchen b complex vitamins tablet Take 1 tablet by mouth daily.      Marland Kitchen buPROPion (WELLBUTRIN XL) 150 MG 24 hr tablet Take 150 mg by mouth daily. 01/19/12- Pt takes 3 tabs daily.      . busPIRone (BUSPAR) 15 MG tablet Take 15 mg by mouth 3 (three) times daily.      . fexofenadine (ALLEGRA) 180 MG tablet Take 180 mg by mouth daily.      . fluticasone  (FLONASE) 50 MCG/ACT nasal spray Place 2 sprays into the nose as needed.      . hydrochlorothiazide (MICROZIDE) 12.5 MG capsule Take 12.5 mg by mouth daily.      . Hydrocodone-Acetaminophen 5-300 MG TABS Take by mouth as needed.      . irbesartan-hydrochlorothiazide (AVALIDE) 300-12.5 MG per tablet Take 1 tablet by mouth daily.      Marland Kitchen NEXIUM 40 MG capsule TAKE 1 CAPSULE DAILY  90 capsule  1  . Rivaroxaban (XARELTO) 20 MG TABS Take 1 tablet (20 mg total) by mouth daily. Start  After  15 mg  BID  X  3  Weeks.  30 tablet  5  . traZODone (DESYREL) 100 MG tablet Take 100 mg by mouth as needed.      Marland Kitchen DISCONTD: Rivaroxaban (XARELTO) 15 MG TABS tablet Take 1 tablet (15 mg total) by mouth 2 (two) times daily. Take 15 mg po  BID  For  3  Weeks..  42 tablet  0    Allergies  Allergen Reactions  . Doxycycline Nausea And Vomiting  . Dust Mite Extract   . Mold Extract (Trichophyton Mentagrophyte)   . Tetracyclines & Related Nausea And Vomiting     Family History: Family History  Problem Relation Age of Onset  . Hypertension Paternal  Grandmother   . Cancer Father   . Hypertension Mother     Social History: History  Substance Use Topics  . Smoking status: Never Smoker   . Smokeless tobacco: Never Used  . Alcohol Use: No    Review of Systems: As noted above.  10 point review of systems was completed.   Physical Exam: Blood pressure 110/64, pulse 67, temperature 97.5 F (36.4 C), temperature source Oral, height 5\' 10"  (1.778 m), weight 350 lb 8 oz (158.986 kg).  General appearance: Alert, cooperative, well developed, well nourished, morbidly obese,  no distress Head: Normocephalic, without obvious abnormality, atraumatic Eyes: Conjunctivae/corneas clear, PERRLA, EOMI Throat: Lips, mucosa, tongue and gums are noted, dental caries present Neck: no adenopathy, supple, symmetrical, trachea midline, thyroid not enlarged, symmetric, no tenderness/mass/nodules and excessive body habitus Back:  symmetric, no curvature. ROM normal. No CVA tenderness. Resp: clear to auscultation bilaterally Cardio: regular rate and rhythm, S1, S2 normal, no murmur, click, rub or gallop GI: soft, non-tender; bowel sounds normal; no masses,  no organomegaly Extremities: extremities normal, atraumatic, no cyanosis or edema Pulses: 1+ bilateral LEs   Laboratory Data: WBCs 7.3, neutrophils 4.9, hemoglobin 11.5, hematocrit 35.1, platelets 258, MCV 83.4, MCH 27.3, MCHC 32.8, RBCs 4.2, RDW 13.7   Impression/Plan: The patient is asked to follow up with her PCP Dr. Allyne Haney soon and begin receiving prescriptions for Xarelto from her PCP.  The patient is also asked to report any unusual signs of bleeding or bruising immediately.  The patient will continue taking Xarelto 20 mg PO daily until her follow-up visit in our office scheduled for February, 2014.  The patient will also have a follow-up LE venous doppler and CBC at that time.  Anticoagulation therapy is scheduled to continue until July, 2014 - one year from onset of DVT.  Kendra Pons NP-C 02/11/2012, 9:33 AM

## 2012-02-11 NOTE — Telephone Encounter (Signed)
appts made and printed for pt aom °

## 2012-05-09 ENCOUNTER — Other Ambulatory Visit: Payer: Self-pay | Admitting: Internal Medicine

## 2012-05-09 DIAGNOSIS — Z1231 Encounter for screening mammogram for malignant neoplasm of breast: Secondary | ICD-10-CM

## 2012-05-19 ENCOUNTER — Ambulatory Visit
Admission: RE | Admit: 2012-05-19 | Discharge: 2012-05-19 | Disposition: A | Discharge status: Home / Self Care | Payer: 59 | Source: Ambulatory Visit | Attending: Internal Medicine | Admitting: Internal Medicine

## 2012-05-19 DIAGNOSIS — Z1231 Encounter for screening mammogram for malignant neoplasm of breast: Secondary | ICD-10-CM

## 2012-07-16 ENCOUNTER — Encounter: Payer: Self-pay | Admitting: Oncology

## 2012-07-16 ENCOUNTER — Telehealth: Payer: Self-pay | Admitting: Oncology

## 2012-07-16 NOTE — Telephone Encounter (Signed)
l/m for her to call but went ahead and reassign appt,print new sch and mailed letter       anne 07/16/12

## 2012-07-18 ENCOUNTER — Telehealth: Payer: Self-pay | Admitting: Hematology and Oncology

## 2012-07-18 NOTE — Telephone Encounter (Signed)
pt called me back and i ret. her call and lerft her a vm that i will call her back later this afternoon    anne

## 2012-08-10 ENCOUNTER — Telehealth: Payer: Self-pay | Admitting: *Deleted

## 2012-08-10 NOTE — Telephone Encounter (Signed)
Received call from pt stating that she has enough xarelto to get through end of week & will have taken for 6 mo & thought she was to d/c after that.  Per NP Annice Pih Hunter's note, pt to take for 1 year & was supposed to f/u with her PCP to prescribe.  Encouraged pt to call her PCP for refill & to cont until discussed with MD/ML at [facility] visit.  She was OK with this.

## 2012-08-18 ENCOUNTER — Ambulatory Visit: Payer: 59 | Admitting: Hematology and Oncology

## 2012-08-18 ENCOUNTER — Ambulatory Visit (HOSPITAL_COMMUNITY)
Admission: RE | Admit: 2012-08-18 | Discharge: 2012-08-18 | Disposition: A | Discharge status: Home / Self Care | Payer: 59 | Source: Ambulatory Visit | Attending: Oncology | Admitting: Oncology

## 2012-08-18 ENCOUNTER — Other Ambulatory Visit: Payer: 59 | Admitting: Lab

## 2012-08-18 DIAGNOSIS — Z86718 Personal history of other venous thrombosis and embolism: Secondary | ICD-10-CM | POA: Insufficient documentation

## 2012-08-18 DIAGNOSIS — I82409 Acute embolism and thrombosis of unspecified deep veins of unspecified lower extremity: Secondary | ICD-10-CM

## 2012-08-18 DIAGNOSIS — Z09 Encounter for follow-up examination after completed treatment for conditions other than malignant neoplasm: Secondary | ICD-10-CM | POA: Insufficient documentation

## 2012-08-18 NOTE — Progress Notes (Signed)
VASCULAR LAB PRELIMINARY  PRELIMINARY  PRELIMINARY  PRELIMINARY  Bilateral lower extremity venous duplex  completed.    Preliminary report:  Bilateral:  No evidence of DVT, superficial thrombosis, or Baker's Cyst.  Previous right popliteal and peroneal vein thrombosis appears resolved.   Shinita Mac, RVT 08/18/2012, 10:20 AM

## 2012-08-19 ENCOUNTER — Other Ambulatory Visit (HOSPITAL_BASED_OUTPATIENT_CLINIC_OR_DEPARTMENT_OTHER): Payer: 59 | Admitting: Lab

## 2012-08-19 ENCOUNTER — Ambulatory Visit (HOSPITAL_BASED_OUTPATIENT_CLINIC_OR_DEPARTMENT_OTHER): Payer: 59 | Admitting: Nurse Practitioner

## 2012-08-19 ENCOUNTER — Other Ambulatory Visit: Payer: Self-pay | Admitting: Oncology

## 2012-08-19 ENCOUNTER — Telehealth: Payer: Self-pay | Admitting: Oncology

## 2012-08-19 VITALS — BP 124/75 | HR 57 | Temp 98.1°F | Resp 18 | Ht 70.0 in | Wt 313.5 lb

## 2012-08-19 DIAGNOSIS — I82409 Acute embolism and thrombosis of unspecified deep veins of unspecified lower extremity: Secondary | ICD-10-CM

## 2012-08-19 LAB — CBC WITH DIFFERENTIAL/PLATELET
Basophils Absolute: 0 10*3/uL (ref 0.0–0.1)
Eosinophils Absolute: 0.1 10*3/uL (ref 0.0–0.5)
HCT: 34.5 % — ABNORMAL LOW (ref 34.8–46.6)
HGB: 11.1 g/dL — ABNORMAL LOW (ref 11.6–15.9)
LYMPH%: 25 % (ref 14.0–49.7)
MCV: 85 fL (ref 79.5–101.0)
MONO%: 6.2 % (ref 0.0–14.0)
NEUT#: 3.8 10*3/uL (ref 1.5–6.5)
NEUT%: 66.5 % (ref 38.4–76.8)
Platelets: 225 10*3/uL (ref 145–400)

## 2012-08-19 NOTE — Progress Notes (Signed)
OFFICE PROGRESS NOTE  Interval history:  Kendra Haney is a 52 year old woman diagnosed with a right lower extremity DVT 12/22/2011, presenting to her primary doctor that day after waking up with pain and swelling at the right lower leg. Venous Doppler showed a deep vein thrombosis involving the right popliteal vein and right calf. Labs were obtained on 12/22/2011 with results as follows: Hemoglobin 12.9, white count 8.9, platelet count 350,000; protein C antigen 85% (reference range of 60 - 150%); factor VII antigen 68% (range 50 - 150%); antithrombin 3 activity 87% (75 - 135%) and antigen 76% (75% - 130%); anticardiolipin antibody IgG negative at less than 9, IgM negative less than 9; factor V Leiden mutation negative.  She was started on Lovenox/Coumadin. She reports a subsequent referral to Dr. Dalene Carrow due to difficulty in achieving  therapeutic PT/INR.  She was seen by Dr. Dalene Carrow in an initial visit on 01/19/2012. Dr. Dalene Carrow identified risk factors including oral contraceptive medication which she has been on for a year and a half prior to the DVT, obesity and immobility. She recommended changing anticoagulation to Xarelto.   Kendra Haney is seen today to establish care with Dr. Cyndie Chime.  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  Medications and allergies were reviewed.  Father is deceased with bile duct cancer. Mother has hypertension, scoliosis and "a leaky heart valve". She has a sister who is healthy.  She lives in [place]. She is single. No children. No tobacco use. Very rare alcohol intake. 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  The pain and swelling at the right lower leg have resolved. She reports chronic bilateral knee pain related to arthritis. She denies bleeding. She has lost approximately 130 pounds over the past 3 years. The weight loss has been intentional with diet and exercise. No unusual  headaches. No vision change. No shortness of breath. No cough. No fevers or sweats. No chest pain. No change in bowel habits. No hematochezia or melena. No hematuria or dysuria.   Objective: Blood pressure 124/75, pulse 57, temperature 98.1 F (36.7 C), temperature source Oral, resp. rate 18, height 5\' 10"  (1.778 m), weight 313 lb 8 oz (142.203 kg).  Oropharynx is without thrush or ulceration. No palpable cervical, supraclavicular or axillary lymph nodes. Lungs are clear. Regular cardiac rhythm. Abdomen is soft, obese. No obvious organomegaly. Trace edema at the lower legs right slightly greater than left. The right lower leg measures  30 cm, left 29 1/2 centimeters. Right calf 46 cm and left calf 46 cm.  Lab Results: Lab Results  Component Value Date   WBC 5.6 08/19/2012   HGB 11.1* 08/19/2012   HCT 34.5* 08/19/2012   MCV 85.0 08/19/2012   PLT 225 08/19/2012    Chemistry:    Chemistry      Component Value Date/Time   NA 139 11/12/2009 1355   K 3.7 11/12/2009 1355   CL 102 11/12/2009 1355   CO2 29 11/12/2009 1355   BUN 9 11/12/2009 1355   CREATININE 0.79 11/12/2009 1355      Component Value Date/Time   CALCIUM 9.6 11/12/2009 1355   ALKPHOS 65 06/28/2009 1557   AST 19 06/28/2009 1557   ALT 24 06/28/2009 1557   BILITOT 1.1 06/28/2009 1557       Studies/Results: No results found.  Medications: I have reviewed the patient's current medications.  Assessment/Plan:  1. Right lower extremity DVT 12/22/2011 presenting with pain and swelling. Initially treated  with Lovenox and Coumadin. Subsequently changed to Xarelto due to difficulty managing the Coumadin. The DVT occurred while she was taking an oral contraceptive. 2. Hypertension. 3. Osteoarthritis. 4. Obesity.  Disposition-Kendra Haney has completed approximately 8 months of anticoagulation following the right lower extremity DVT. Dr. Cyndie Chime recommends discontinuation of anticoagulation at this time. She will return to complete a  hypercoagulation lab panel in one month and will return for a followup visit in 2 months to review the results. She will contact the office in the interim with any problems.  Patient seen with Dr. Cyndie Chime.  Lonna Cobb ANP/GNP-BC    CC Dr. Dorothyann Peng

## 2012-08-19 NOTE — Telephone Encounter (Signed)
gv and printed pt appt schedule for Marcha and April

## 2012-09-19 ENCOUNTER — Other Ambulatory Visit (HOSPITAL_BASED_OUTPATIENT_CLINIC_OR_DEPARTMENT_OTHER): Payer: 59

## 2012-09-19 DIAGNOSIS — I82409 Acute embolism and thrombosis of unspecified deep veins of unspecified lower extremity: Secondary | ICD-10-CM

## 2012-09-21 LAB — LUPUS ANTICOAGULANT PANEL
DRVVT: 35.8 secs (ref <42.9)
Lupus Anticoagulant: NOT DETECTED
PTT Lupus Anticoagulant: 32.3 secs (ref 28.0–43.0)

## 2012-09-21 LAB — PROTEIN S, ANTIGEN, FREE: Protein S Ag, Free: 73 % normal (ref 50–147)

## 2012-09-21 LAB — D-DIMER, QUANTITATIVE: D-Dimer, Quant: 0.36 ug/mL-FEU (ref 0.00–0.48)

## 2012-09-21 LAB — BETA-2 GLYCOPROTEIN ANTIBODIES: Beta-2-Glycoprotein I IgA: 4 A Units (ref <20)

## 2012-10-14 ENCOUNTER — Ambulatory Visit (HOSPITAL_BASED_OUTPATIENT_CLINIC_OR_DEPARTMENT_OTHER): Payer: 59 | Admitting: Nurse Practitioner

## 2012-10-14 VITALS — BP 140/77 | HR 72 | Temp 98.0°F | Resp 20 | Ht 70.0 in | Wt 305.4 lb

## 2012-10-14 DIAGNOSIS — I82401 Acute embolism and thrombosis of unspecified deep veins of right lower extremity: Secondary | ICD-10-CM

## 2012-10-14 DIAGNOSIS — I82409 Acute embolism and thrombosis of unspecified deep veins of unspecified lower extremity: Secondary | ICD-10-CM

## 2012-10-14 NOTE — Progress Notes (Signed)
OFFICE PROGRESS NOTE  Interval history:  Kendra Haney is a 52 year old woman diagnosed with a right lower extremity DVT 12/22/2011, presenting to her primary doctor that day after waking up with pain and swelling at the right lower leg. Venous Doppler showed a deep vein thrombosis involving the right popliteal vein and right calf. Labs were obtained on 12/22/2011 with results as follows: Hemoglobin 12.9, white count 8.9, platelet count 350,000; protein C antigen 85% (reference range of 60 - 150%); factor VII antigen 68% (range 50 - 150%); antithrombin 3 activity 87% (75 - 135%) and antigen 76% (75% - 130%); anticardiolipin antibody IgG negative at less than 9, IgM negative less than 9; factor V Leiden mutation negative.  She was started on Lovenox/Coumadin. She reports a subsequent referral to Dr. Dalene Carrow due to difficulty in achieving therapeutic PT/INR. She was seen by Dr. Dalene Carrow in an initial visit on 01/19/2012. Dr. Dalene Carrow identified risk factors including oral contraceptive medication which she has been on for a year and a half prior to the DVT, obesity and an inactive lifestyle. She recommended changing anticoagulation to Xarelto. She established care with Dr. Cyndie Chime on 08/19/2012. At that time she had completed approximately 8 months of anticoagulation. The Xarelto was discontinued.  Additional labs were obtained on 09/19/2012 with results as follows: D-dimer in normal range at 0.36; beta-2 glycoprotein antibodies all in normal range (IgG 2, IgM 4, IgA 4); Lupus anticoagulant not detected; negative for the prothrombin gene mutation; protein C activity increased at 181% (range 75-133%); protein S activity in normal range at 85% (range at 69-129%) and protein S antigen, free, in normal range at 73% (50-147%).  She denies any leg swelling or calf pain. No shortness of breath or chest pain. She denies bleeding. She continues to work on weight loss. She exercises frequently. She is considering  bilateral knee replacements.    Objective: Blood pressure 140/77, pulse 72, temperature 98 F (36.7 C), temperature source Oral, resp. rate 20, height 5\' 10"  (1.778 m), weight 305 lb 6.4 oz (138.529 kg).  Oropharynx is without thrush or ulceration. Lungs are clear. Regular cardiac rhythm. Abdomen is soft, obese. No obvious organomegaly. Trace edema at the lower legs bilaterally right slightly greater than left.  Lab Results: Lab Results  Component Value Date   WBC 5.6 08/19/2012   HGB 11.1* 08/19/2012   HCT 34.5* 08/19/2012   MCV 85.0 08/19/2012   PLT 225 08/19/2012    Chemistry:    Chemistry      Component Value Date/Time   NA 139 11/12/2009 1355   K 3.7 11/12/2009 1355   CL 102 11/12/2009 1355   CO2 29 11/12/2009 1355   BUN 9 11/12/2009 1355   CREATININE 0.79 11/12/2009 1355      Component Value Date/Time   CALCIUM 9.6 11/12/2009 1355   ALKPHOS 65 06/28/2009 1557   AST 19 06/28/2009 1557   ALT 24 06/28/2009 1557   BILITOT 1.1 06/28/2009 1557       Studies/Results: No results found.  Medications: I have reviewed the patient's current medications.  Assessment/Plan:  1. Right lower extremity DVT 12/22/2011 presenting with pain and swelling. Initially treated with Lovenox and Coumadin. Subsequently changed to Xarelto due to difficulty managing the Coumadin. The DVT occurred while she was taking an oral contraceptive. 2. Hypertension. 3. Osteoarthritis. 4. Obesity.  Disposition-Dr. Cyndie Chime reviewed the outstanding laboratory data with Ms. Pariseau at today's visit. She will remain off of anticoagulation. She knows to seek evaluation should she develop signs  of a recurrent blood clot. Dr. Cyndie Chime recommended that she wait at least one year from the time of the blood clot before considering elective surgery. It was also recommended that she avoid estrogen.  We did not schedule formal followup in our office. We will be happy to see her in the future as needed.  Kendra Haney  ANP/GNP-BC

## 2013-06-21 ENCOUNTER — Other Ambulatory Visit: Payer: Self-pay

## 2013-06-21 DIAGNOSIS — Z1231 Encounter for screening mammogram for malignant neoplasm of breast: Secondary | ICD-10-CM

## 2013-07-19 ENCOUNTER — Ambulatory Visit: Payer: 59

## 2013-08-02 ENCOUNTER — Other Ambulatory Visit: Payer: Self-pay | Admitting: Dermatology

## 2013-08-08 ENCOUNTER — Ambulatory Visit: Payer: 59

## 2013-09-11 ENCOUNTER — Encounter: Payer: Self-pay | Admitting: Oncology

## 2013-09-20 ENCOUNTER — Encounter: Payer: Self-pay | Admitting: Oncology

## 2013-09-22 ENCOUNTER — Other Ambulatory Visit: Payer: Self-pay | Admitting: Dermatology

## 2013-11-17 ENCOUNTER — Institutional Professional Consult (permissible substitution): Payer: 59 | Admitting: Internal Medicine

## 2014-10-02 ENCOUNTER — Ambulatory Visit: Payer: Self-pay | Admitting: Orthopedic Surgery

## 2014-10-02 NOTE — Progress Notes (Signed)
Preoperative surgical orders have been place into the Epic hospital system for Kendra Haney on 10/02/2014, 12:16 PM  by Mickel Crow for surgery on 10-24-14.  Preop Bilateral Total Knee orders including IV Tylenol, and IV Decadron as long as there are no contraindications to the above medications. Arlee Muslim, PA-C

## 2014-10-12 NOTE — Patient Instructions (Signed)
Kendra Haney  10/12/2014   Your procedure is scheduled on:  10/24/2014    Report to [facility] Main  Entrance and follow signs to               [place] at      0900 AM.  Call this number if you have problems the morning of surgery [phone]   Remember:  Do not eat food or drink liquids :After Midnight.     Take these medicines the morning of surgery with A SIP OF WATER:  Xanax if needed, Wellbutrin, Buspar, Allegra, Nexium                                You may not have any metal on your body including hair pins and              piercings  Do not wear jewelry, make-up, lotions, powders or perfumes., deodorant.               Do not wear nail polish.  Do not shave  48 hours prior to surgery.                 Do not bring valuables to the hospital. [place].  Contacts, dentures or bridgework may not be worn into surgery.  Leave suitcase in the car. After surgery it may be brought to your room.       Special Instructions: coughing and deep breathing exercises, leg exercises               Please read over the following fact sheets you were given: _____________________________________________________________________             [facility] - Preparing for Surgery Before surgery, you can play an important role.  Because skin is not sterile, your skin needs to be as free of germs as possible.  You can reduce the number of germs on your skin by washing with CHG (chlorahexidine gluconate) soap before surgery.  CHG is an antiseptic cleaner which kills germs and bonds with the skin to continue killing germs even after washing. Please DO NOT use if you have an allergy to CHG or antibacterial soaps.  If your skin becomes reddened/irritated stop using the CHG and inform your nurse when you arrive at Short Stay. Do not shave (including legs and underarms) for at least 48 hours prior to the first CHG shower.   You may shave your face/neck. Please follow these instructions carefully:  1.  Shower with CHG Soap the night before surgery and the  morning of Surgery.  2.  If you choose to wash your hair, wash your hair first as usual with your  normal  shampoo.  3.  After you shampoo, rinse your hair and body thoroughly to remove the  shampoo.                           4.  Use CHG as you would any other liquid soap.  You can apply chg directly  to the skin and wash                       Gently with  a scrungie or clean washcloth.  5.  Apply the CHG Soap to your body ONLY FROM THE NECK DOWN.   Do not use on face/ open                           Wound or open sores. Avoid contact with eyes, ears mouth and genitals (private parts).                       Wash face,  Genitals (private parts) with your normal soap.             6.  Wash thoroughly, paying special attention to the area where your surgery  will be performed.  7.  Thoroughly rinse your body with warm water from the neck down.  8.  DO NOT shower/wash with your normal soap after using and rinsing off  the CHG Soap.                9.  Pat yourself dry with a clean towel.            10.  Wear clean pajamas.            11.  Place clean sheets on your bed the night of your first shower and do not  sleep with pets. Day of Surgery : Do not apply any lotions/deodorants the morning of surgery.  Please wear clean clothes to the hospital/surgery center.  FAILURE TO FOLLOW THESE INSTRUCTIONS MAY RESULT IN THE CANCELLATION OF YOUR SURGERY PATIENT SIGNATURE_________________________________  NURSE SIGNATURE__________________________________  ________________________________________________________________________  WHAT IS A BLOOD TRANSFUSION? Blood Transfusion Information  A transfusion is the replacement of blood or some of its parts. Blood is made up of multiple cells which provide different functions.  Red blood cells carry oxygen and are used for blood  loss replacement.  White blood cells fight against infection.  Platelets control bleeding.  Plasma helps clot blood.  Other blood products are available for specialized needs, such as hemophilia or other clotting disorders. BEFORE THE TRANSFUSION  Who gives blood for transfusions?   Healthy volunteers who are fully evaluated to make sure their blood is safe. This is blood bank blood. Transfusion therapy is the safest it has ever been in the practice of medicine. Before blood is taken from a donor, a complete history is taken to make sure that person has no history of diseases nor engages in risky social behavior (examples are intravenous drug use or sexual activity with multiple partners). The donor's travel history is screened to minimize risk of transmitting infections, such as malaria. The donated blood is tested for signs of infectious diseases, such as HIV and hepatitis. The blood is then tested to be sure it is compatible with you in order to minimize the chance of a transfusion reaction. If you or a relative donates blood, this is often done in anticipation of surgery and is not appropriate for emergency situations. It takes many days to process the donated blood. RISKS AND COMPLICATIONS Although transfusion therapy is very safe and saves many lives, the main dangers of transfusion include:  1. Getting an infectious disease. 2. Developing a transfusion reaction. This is an allergic reaction to something in the blood you were given. Every precaution is taken to prevent this. The decision to have a blood transfusion has been considered carefully by your caregiver before blood is given. Blood is not given unless the benefits outweigh the risks.  AFTER THE TRANSFUSION  Right after receiving a blood transfusion, you will usually feel much better and more energetic. This is especially true if your red blood cells have gotten low (anemic). The transfusion raises the level of the red blood cells  which carry oxygen, and this usually causes an energy increase.  The nurse administering the transfusion will monitor you carefully for complications. HOME CARE INSTRUCTIONS  No special instructions are needed after a transfusion. You may find your energy is better. Speak with your caregiver about any limitations on activity for underlying diseases you may have. SEEK MEDICAL CARE IF:   Your condition is not improving after your transfusion.  You develop redness or irritation at the intravenous (IV) site. SEEK IMMEDIATE MEDICAL CARE IF:  Any of the following symptoms occur over the next 12 hours:  Shaking chills.  You have a temperature by mouth above 102 F (38.9 C), not controlled by medicine.  Chest, back, or muscle pain.  People around you feel you are not acting correctly or are confused.  Shortness of breath or difficulty breathing.  Dizziness and fainting.  You get a rash or develop hives.  You have a decrease in urine output.  Your urine turns a dark color or changes to pink, red, or brown. Any of the following symptoms occur over the next 10 days:  You have a temperature by mouth above 102 F (38.9 C), not controlled by medicine.  Shortness of breath.  Weakness after normal activity.  The white part of the eye turns yellow (jaundice).  You have a decrease in the amount of urine or are urinating less often.  Your urine turns a dark color or changes to pink, red, or brown. Document Released: 06/26/2000 Document Revised: 09/21/2011 Document Reviewed: 02/13/2008 ExitCare Patient Information 2014 [place].  _______________________________________________________________________  Incentive Spirometer  An incentive spirometer is a tool that can help keep your lungs clear and active. This tool measures how well you are filling your lungs with each breath. Taking long deep breaths may help reverse or decrease the chance of developing breathing (pulmonary)  problems (especially infection) following:  A long period of time when you are unable to move or be active. BEFORE THE PROCEDURE   If the spirometer includes an indicator to show your best effort, your nurse or respiratory therapist will set it to a desired goal.  If possible, sit up straight or lean slightly forward. Try not to slouch.  Hold the incentive spirometer in an upright position. INSTRUCTIONS FOR USE  3. Sit on the edge of your bed if possible, or sit up as far as you can in bed or on a chair. 4. Hold the incentive spirometer in an upright position. 5. Breathe out normally. 6. Place the mouthpiece in your mouth and seal your lips tightly around it. 7. Breathe in slowly and as deeply as possible, raising the piston or the ball toward the top of the column. 8. Hold your breath for 3-5 seconds or for as long as possible. Allow the piston or ball to fall to the bottom of the column. 9. Remove the mouthpiece from your mouth and breathe out normally. 10. Rest for a few seconds and repeat Steps 1 through 7 at least 10 times every 1-2 hours when you are awake. Take your time and take a few normal breaths between deep breaths. 11. The spirometer may include an indicator to show your best effort. Use the indicator as a goal to work toward during  each repetition. 12. After each set of 10 deep breaths, practice coughing to be sure your lungs are clear. If you have an incision (the cut made at the time of surgery), support your incision when coughing by placing a pillow or rolled up towels firmly against it. Once you are able to get out of bed, walk around indoors and cough well. You may stop using the incentive spirometer when instructed by your caregiver.  RISKS AND COMPLICATIONS  Take your time so you do not get dizzy or light-headed.  If you are in pain, you may need to take or ask for pain medication before doing incentive spirometry. It is harder to take a deep breath if you are having  pain. AFTER USE  Rest and breathe slowly and easily.  It can be helpful to keep track of a log of your progress. Your caregiver can provide you with a simple table to help with this. If you are using the spirometer at home, follow these instructions: [place] IF:   You are having difficultly using the spirometer.  You have trouble using the spirometer as often as instructed.  Your pain medication is not giving enough relief while using the spirometer.  You develop fever of 100.5 F (38.1 C) or higher. SEEK IMMEDIATE MEDICAL CARE IF:   You cough up bloody sputum that had not been present before.  You develop fever of 102 F (38.9 C) or greater.  You develop worsening pain at or near the incision site. MAKE SURE YOU:   Understand these instructions.  Will watch your condition.  Will get help right away if you are not doing well or get worse. Document Released: 11/09/2006 Document Revised: 09/21/2011 Document Reviewed: 01/10/2007 [facility] Patient Information 2014 [place], [place].   ________________________________________________________________________

## 2014-10-15 ENCOUNTER — Encounter (HOSPITAL_COMMUNITY): Payer: Self-pay

## 2014-10-15 ENCOUNTER — Encounter (HOSPITAL_COMMUNITY)
Admission: RE | Admit: 2014-10-15 | Discharge: 2014-10-15 | Disposition: A | Discharge status: Home / Self Care | Payer: 59 | Source: Ambulatory Visit | Attending: Orthopedic Surgery | Admitting: Orthopedic Surgery

## 2014-10-15 DIAGNOSIS — Z01812 Encounter for preprocedural laboratory examination: Secondary | ICD-10-CM | POA: Diagnosis not present

## 2014-10-15 HISTORY — DX: Anxiety disorder, unspecified: F41.9

## 2014-10-15 HISTORY — DX: Depression, unspecified: F32.A

## 2014-10-15 HISTORY — DX: Gastro-esophageal reflux disease without esophagitis: K21.9

## 2014-10-15 HISTORY — DX: Major depressive disorder, single episode, unspecified: F32.9

## 2014-10-15 HISTORY — DX: Acute embolism and thrombosis of unspecified deep veins of unspecified lower extremity: I82.409

## 2014-10-15 HISTORY — DX: Other specified postprocedural states: Z98.890

## 2014-10-15 HISTORY — DX: Unspecified osteoarthritis, unspecified site: M19.90

## 2014-10-15 HISTORY — DX: Nausea with vomiting, unspecified: R11.2

## 2014-10-15 HISTORY — DX: Unspecified asthma, uncomplicated: J45.909

## 2014-10-15 HISTORY — DX: Malignant (primary) neoplasm, unspecified: C80.1

## 2014-10-15 HISTORY — DX: Family history of other specified conditions: Z84.89

## 2014-10-15 LAB — COMPREHENSIVE METABOLIC PANEL
ALBUMIN: 4.1 g/dL (ref 3.5–5.2)
ALK PHOS: 49 U/L (ref 39–117)
ALT: 12 U/L (ref 0–35)
AST: 15 U/L (ref 0–37)
Anion gap: 8 (ref 5–15)
BUN: 11 mg/dL (ref 6–23)
CO2: 26 mmol/L (ref 19–32)
Calcium: 9.4 mg/dL (ref 8.4–10.5)
Chloride: 105 mmol/L (ref 96–112)
Creatinine, Ser: 0.73 mg/dL (ref 0.50–1.10)
GFR calc Af Amer: >90 mL/min (ref >90)
GLUCOSE: 94 mg/dL (ref 70–99)
POTASSIUM: 4 mmol/L (ref 3.5–5.1)
Sodium: 139 mmol/L (ref 135–145)
Total Bilirubin: 0.6 mg/dL (ref 0.3–1.2)
Total Protein: 7.2 g/dL (ref 6.0–8.3)

## 2014-10-15 LAB — URINALYSIS, ROUTINE W REFLEX MICROSCOPIC
BILIRUBIN URINE: NEGATIVE
Glucose, UA: NEGATIVE mg/dL
KETONES UR: NEGATIVE mg/dL
Nitrite: NEGATIVE
PH: 6 (ref 5.0–8.0)
PROTEIN: NEGATIVE mg/dL
Specific Gravity, Urine: 1.02 (ref 1.005–1.030)
Urobilinogen, UA: 0.2 mg/dL (ref 0.0–1.0)

## 2014-10-15 LAB — CBC
HEMATOCRIT: 38.6 % (ref 36.0–46.0)
Hemoglobin: 12.2 g/dL (ref 12.0–15.0)
MCH: 27.3 pg (ref 26.0–34.0)
MCHC: 31.6 g/dL (ref 30.0–36.0)
MCV: 86.4 fL (ref 78.0–100.0)
Platelets: 270 10*3/uL (ref 150–400)
RBC: 4.47 MIL/uL (ref 3.87–5.11)
RDW: 15.5 % (ref 11.5–15.5)
WBC: 4.5 10*3/uL (ref 4.0–10.5)

## 2014-10-15 LAB — URINE MICROSCOPIC-ADD ON

## 2014-10-15 LAB — SURGICAL PCR SCREEN
MRSA, PCR: NEGATIVE
STAPHYLOCOCCUS AUREUS: NEGATIVE

## 2014-10-15 LAB — HCG, SERUM, QUALITATIVE: PREG SERUM: NEGATIVE

## 2014-10-15 LAB — APTT: aPTT: 33 seconds (ref 24–37)

## 2014-10-15 LAB — PROTIME-INR
INR: 1.13 (ref 0.00–1.49)
Prothrombin Time: 14.7 seconds (ref 11.6–15.2)

## 2014-10-15 NOTE — Progress Notes (Signed)
Dr Glendale Chard- 09/07/2014 clearance on chart .

## 2014-10-15 NOTE — Progress Notes (Signed)
Recent UTI.  Patient completed antibiotic ( Nitrofurantoin)  on 10/15/2014 prescribed by PCP per patient.

## 2014-10-15 NOTE — Progress Notes (Signed)
U/A with micro results faxed via EPIC to Dr Wynelle Link.

## 2014-10-15 NOTE — Progress Notes (Signed)
Called and requested EKG done 08/2014 from office of Dr Glendale Chard.  Left message on office phone.

## 2014-10-16 NOTE — H&P (Signed)
TOTAL KNEE ADMISSION H&P  Patient is being admitted for right and left total knee arthroplasty.  Subjective:  Chief Complaint:left and right knee pain.  HPI: Kendra Haney, 54 y.o. female, has a history of pain and functional disability in the right and left knees due to arthritis and has failed non-surgical conservative treatments for greater than 12 weeks to includeNSAID's and/or analgesics, corticosteriod injections, viscosupplementation injections, weight reduction as appropriate and activity modification.  Onset of symptoms was gradual, starting 8 years ago with gradually worsening course since that time. The patient noted no past surgery on the right and left knee(s).  Patient currently rates pain in the right and left knee(s) at 8 out of 10 with activity. Patient has night pain, worsening of pain with activity and weight bearing, pain that interferes with activities of daily living, pain with passive range of motion, crepitus and joint swelling.  Patient has evidence of periarticular osteophytes, joint subluxation and joint space narrowing by imaging studies. There is no active infection.  Patient Active Problem List   Diagnosis Date Noted  . DVT (deep venous thrombosis) 01/19/2012  . PMS (premenstrual syndrome) 11/09/2011  . OBESITY 06/28/2009  . GERD 06/28/2009  . NAUSEA 06/28/2009  . DIARRHEA 06/28/2009  . ABDOMINAL PAIN-EPIGASTRIC 06/28/2009  . HYPERTENSION 06/27/2009   Past Medical History  Diagnosis Date  . H/O varicella   . Yeast infection   . Menses, irregular 07/02/10  . Menopausal symptoms 10/14/10  . Vulvar itching 04/14/11  . Hydradenitis 04/14/11  . Complication of anesthesia     Nausea  & vomiting during eye surgery  . PONV (postoperative nausea and vomiting)   . Family history of adverse reaction to anesthesia     sister has problems with nausea and vomiting   . Hypertension     hx of hypertension no longer on meds   . Asthma     greater than 20 years ago   .  Anxiety   . Depression   . GERD (gastroesophageal reflux disease)   . Arthritis   . Cancer     hx of skin cancer on back   . DVT (deep venous thrombosis)     12/2011     Past Surgical History  Procedure Laterality Date  . Wisdom tooth extraction    . 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 Tonsillectomy      54 years old      Current outpatient prescriptions:  .  acetaminophen (TYLENOL) 500 MG tablet, Take 650 mg by mouth every 6 (six) hours as needed for mild pain. , Disp: , Rfl:  .  ALPRAZolam (XANAX) 1 MG tablet, Take 0.5-3 mg by mouth 3 (three) times daily as needed for anxiety. , Disp: , Rfl:  .  buPROPion (WELLBUTRIN XL) 150 MG 24 hr tablet, Take 450 mg by mouth every morning. Pt takes 3 tabs daily., Disp: , Rfl:  .  busPIRone (BUSPAR) 15 MG tablet, Take 15 mg by mouth 3 (three) times daily as needed (DEPRESSION). Patient takes on 15 mg in the am and then as needed the rest of the day., Disp: , Rfl:  .  fexofenadine (ALLEGRA) 180 MG tablet, Take 180 mg by mouth daily., Disp: , Rfl:  .  HYDROcodone-acetaminophen (NORCO) 7.5-325 MG per tablet, Take 1 tablet by mouth every 6 (six) hours as needed for moderate pain., Disp: , Rfl:  .  NEXIUM 40 MG capsule, TAKE 1 CAPSULE DAILY (  Patient taking differently: patient takes 20 mg daily), Disp: 90 capsule, Rfl: 1 .  traZODone (DESYREL) 100 MG tablet, Take 100 mg by mouth at bedtime as needed for sleep. , Disp: , Rfl:  .  nitrofurantoin (MACRODANTIN) 100 MG capsule, Take 100 mg by mouth 4 (four) times daily. Patient completed on 10/15/2014., Disp: , Rfl:  .  triamcinolone cream (KENALOG) 0.1 %, Apply 1 application topically 2 (two) times daily. As needed, Disp: , Rfl:   Allergies  Allergen Reactions  . Doxycycline Nausea And Vomiting  . Dust Mite Extract     UNKNOWN  . Mold Extract [Trichophyton Mentagrophyte]     UNKNOWN  . Nsaids Other (See Comments)    GI UPSET   . Tetracyclines & Related Nausea And Vomiting     History  Substance Use Topics  . Smoking status: Never Smoker   . Smokeless tobacco: Never Used  . Alcohol Use: No    Family History  Problem Relation Age of Onset  . Hypertension Paternal Grandmother   . Cancer Father   . Hypertension Mother      Review of Systems  Constitutional: Positive for weight loss. Negative for fever, chills, malaise/fatigue and diaphoresis.  HENT: Negative.   Eyes: Negative.   Respiratory: Negative.   Cardiovascular: Negative.   Gastrointestinal: Negative.   Genitourinary: Negative.   Musculoskeletal: Positive for myalgias and joint pain. Negative for back pain, falls and neck pain.       Bilateral knee pain  Skin: Negative.   Neurological: Negative.  Negative for weakness.  Endo/Heme/Allergies: Positive for environmental allergies. Negative for polydipsia. Does not bruise/bleed easily.  Psychiatric/Behavioral: Negative.     Objective:  Physical Exam  Constitutional: She is oriented to person, place, and time. She appears well-developed. No distress.  Morbidly obese  HENT:  Head: Normocephalic and atraumatic.  Right Ear: External ear normal.  Left Ear: External ear normal.  Nose: Nose normal.  Mouth/Throat: Oropharynx is clear and moist.  Eyes: Conjunctivae and EOM are normal.  Neck: Normal range of motion. Neck supple.  Cardiovascular: Normal rate, regular rhythm, normal heart sounds and intact distal pulses.   No murmur heard. Respiratory: Effort normal and breath sounds normal. No respiratory distress. She has no wheezes.  GI: Soft. Bowel sounds are normal. She exhibits no distension. There is no tenderness.  Musculoskeletal:       Right hip: Normal.       Left hip: Normal.       Right knee: She exhibits decreased range of motion and swelling. She exhibits no effusion and no erythema. Tenderness found. Medial joint line and lateral joint line tenderness noted.       Left knee: She exhibits decreased range of motion and swelling. She  exhibits no effusion and no erythema. Tenderness found. Medial joint line and lateral joint line tenderness noted.  Both knees show no effusion. Range of motion is about 5 to 100 on each side. There is no instability noted.  Neurological: She is alert and oriented to person, place, and time. She has normal strength and normal reflexes. No sensory deficit.  Skin: No rash noted. She is not diaphoretic. No erythema.  Psychiatric: She has a normal mood and affect. Her behavior is normal.    Vitals  Weight: 288 lb Height: 70in Body Surface Area: 2.44 m Body Mass Index: 41.32 kg/m  BP: 132/92 (Sitting, Left Arm, Standard) Pulse: 76 bpm  Imaging Review Plain radiographs demonstrate severe degenerative joint disease of the  right and left knee(s). The overall alignment issignificant varus. The bone quality appears to be good for age and reported activity level.  Assessment/Plan:  End stage primary osteoarthritis, right and left knees   The patient history, physical examination, clinical judgment of the provider and imaging studies are consistent with end stage degenerative joint disease of the right and left knee(s) and total knee arthroplasty is deemed medically necessary. The treatment options including medical management, injection therapy arthroscopy and arthroplasty were discussed at length. The risks and benefits of total knee arthroplasty were presented and reviewed. The risks due to aseptic loosening, infection, stiffness, patella tracking problems, thromboembolic complications and other imponderables were discussed. The patient acknowledged the explanation, agreed to proceed with the plan and consent was signed. Patient is being admitted for inpatient treatment for surgery, pain control, PT, OT, prophylactic antibiotics, VTE prophylaxis, progressive ambulation and ADL's and discharge planning. The patient is planning to be discharged to inpatient rehab ([facility]IR)   Topical TXA PCP: Dr.  Bryon Lions Needs equipment (walker, cane, etc)   Ardeen Jourdain, PA-C

## 2014-10-16 NOTE — Progress Notes (Signed)
Received EKG from PCP office of Dr Glendale Chard and placed on chart. Requested LOV note done 09/07/2014 from office.

## 2014-10-17 NOTE — Progress Notes (Signed)
LOV note with PCP - Dr Glendale Chard on chart dated 09/07/14.

## 2014-10-23 MED ORDER — CEFAZOLIN SODIUM 10 G IJ SOLR
3.0000 g | INTRAMUSCULAR | Status: AC
  Administered 2014-10-24: 3 g via INTRAVENOUS
  Filled 2014-10-23 (×2): qty 3000

## 2014-10-24 ENCOUNTER — Inpatient Hospital Stay (HOSPITAL_COMMUNITY): Payer: 59 | Admitting: Anesthesiology

## 2014-10-24 ENCOUNTER — Encounter (HOSPITAL_COMMUNITY)
Admission: RE | Disposition: A | Discharge status: Skilled Nursing Facility | Payer: Self-pay | Source: Ambulatory Visit | Attending: Orthopedic Surgery

## 2014-10-24 ENCOUNTER — Inpatient Hospital Stay (HOSPITAL_COMMUNITY)
Admission: RE | Admit: 2014-10-24 | Discharge: 2014-10-29 | DRG: 462 | Disposition: A | Discharge status: Skilled Nursing Facility | Payer: 59 | Source: Ambulatory Visit | Attending: Orthopedic Surgery | Admitting: Orthopedic Surgery

## 2014-10-24 ENCOUNTER — Encounter (HOSPITAL_COMMUNITY): Payer: Self-pay | Admitting: *Deleted

## 2014-10-24 DIAGNOSIS — K219 Gastro-esophageal reflux disease without esophagitis: Secondary | ICD-10-CM | POA: Diagnosis present

## 2014-10-24 DIAGNOSIS — Z85828 Personal history of other malignant neoplasm of skin: Secondary | ICD-10-CM | POA: Diagnosis not present

## 2014-10-24 DIAGNOSIS — Z86718 Personal history of other venous thrombosis and embolism: Secondary | ICD-10-CM | POA: Diagnosis not present

## 2014-10-24 DIAGNOSIS — D62 Acute posthemorrhagic anemia: Secondary | ICD-10-CM | POA: Diagnosis not present

## 2014-10-24 DIAGNOSIS — M170 Bilateral primary osteoarthritis of knee: Secondary | ICD-10-CM | POA: Diagnosis present

## 2014-10-24 DIAGNOSIS — M179 Osteoarthritis of knee, unspecified: Secondary | ICD-10-CM | POA: Diagnosis present

## 2014-10-24 DIAGNOSIS — Z8249 Family history of ischemic heart disease and other diseases of the circulatory system: Secondary | ICD-10-CM

## 2014-10-24 DIAGNOSIS — Z96653 Presence of artificial knee joint, bilateral: Secondary | ICD-10-CM | POA: Diagnosis not present

## 2014-10-24 DIAGNOSIS — F329 Major depressive disorder, single episode, unspecified: Secondary | ICD-10-CM | POA: Diagnosis present

## 2014-10-24 DIAGNOSIS — I10 Essential (primary) hypertension: Secondary | ICD-10-CM | POA: Diagnosis present

## 2014-10-24 DIAGNOSIS — F419 Anxiety disorder, unspecified: Secondary | ICD-10-CM | POA: Diagnosis present

## 2014-10-24 DIAGNOSIS — M25561 Pain in right knee: Secondary | ICD-10-CM | POA: Diagnosis present

## 2014-10-24 DIAGNOSIS — M1710 Unilateral primary osteoarthritis, unspecified knee: Secondary | ICD-10-CM | POA: Diagnosis present

## 2014-10-24 DIAGNOSIS — Z6841 Body Mass Index (BMI) 40.0 and over, adult: Secondary | ICD-10-CM

## 2014-10-24 DIAGNOSIS — J45909 Unspecified asthma, uncomplicated: Secondary | ICD-10-CM | POA: Diagnosis present

## 2014-10-24 HISTORY — PX: TOTAL KNEE ARTHROPLASTY: SHX125

## 2014-10-24 LAB — TYPE AND SCREEN
ABO/RH(D): A POS
ANTIBODY SCREEN: NEGATIVE

## 2014-10-24 LAB — ABO/RH: ABO/RH(D): A POS

## 2014-10-24 SURGERY — ARTHROPLASTY, KNEE, BILATERAL, TOTAL
Anesthesia: General | Site: Knee | Laterality: Bilateral

## 2014-10-24 MED ORDER — SUCCINYLCHOLINE CHLORIDE 20 MG/ML IJ SOLN
INTRAMUSCULAR | Status: DC | PRN
  Administered 2014-10-24: 100 mg via INTRAVENOUS

## 2014-10-24 MED ORDER — LIDOCAINE HCL (CARDIAC) 20 MG/ML IV SOLN
INTRAVENOUS | Status: DC | PRN
  Administered 2014-10-24: 50 mg via INTRAVENOUS

## 2014-10-24 MED ORDER — TRANEXAMIC ACID 100 MG/ML IV SOLN
2000.0000 mg | INTRAVENOUS | Status: DC | PRN
  Administered 2014-10-24: 2000 mg via INTRAVENOUS

## 2014-10-24 MED ORDER — DEXAMETHASONE SODIUM PHOSPHATE 10 MG/ML IJ SOLN
INTRAMUSCULAR | Status: AC
  Filled 2014-10-24: qty 1

## 2014-10-24 MED ORDER — SODIUM CHLORIDE 0.9 % IJ SOLN
INTRAMUSCULAR | Status: AC
  Filled 2014-10-24: qty 50

## 2014-10-24 MED ORDER — EPHEDRINE SULFATE 50 MG/ML IJ SOLN
INTRAMUSCULAR | Status: DC | PRN
  Administered 2014-10-24: 5 mg via INTRAVENOUS
  Administered 2014-10-24: 10 mg via INTRAVENOUS
  Administered 2014-10-24: 5 mg via INTRAVENOUS

## 2014-10-24 MED ORDER — ONDANSETRON HCL 4 MG/2ML IJ SOLN
4.0000 mg | Freq: Four times a day (QID) | INTRAMUSCULAR | Status: DC | PRN
  Administered 2014-10-26: 4 mg via INTRAVENOUS
  Filled 2014-10-24: qty 2

## 2014-10-24 MED ORDER — ONDANSETRON HCL 4 MG/2ML IJ SOLN
INTRAMUSCULAR | Status: AC
  Filled 2014-10-24: qty 2

## 2014-10-24 MED ORDER — DIPHENHYDRAMINE HCL 12.5 MG/5ML PO ELIX
12.5000 mg | ORAL_SOLUTION | ORAL | Status: DC | PRN
Start: 2014-10-24 — End: 2014-10-29

## 2014-10-24 MED ORDER — PHENOL 1.4 % MT LIQD
1.0000 | OROMUCOSAL | Status: DC | PRN
Start: 2014-10-24 — End: 2014-10-29

## 2014-10-24 MED ORDER — WARFARIN - PHARMACIST DOSING INPATIENT: Freq: Every day | Status: DC

## 2014-10-24 MED ORDER — DEXAMETHASONE SODIUM PHOSPHATE 10 MG/ML IJ SOLN
10.0000 mg | Freq: Once | INTRAMUSCULAR | Status: AC
  Administered 2014-10-24: 10 mg via INTRAVENOUS

## 2014-10-24 MED ORDER — LIDOCAINE HCL (CARDIAC) 20 MG/ML IV SOLN
INTRAVENOUS | Status: AC
  Filled 2014-10-24: qty 5

## 2014-10-24 MED ORDER — DOCUSATE SODIUM 100 MG PO CAPS
100.0000 mg | ORAL_CAPSULE | Freq: Two times a day (BID) | ORAL | Status: DC
  Administered 2014-10-24 – 2014-10-29 (×11): 100 mg via ORAL

## 2014-10-24 MED ORDER — ROPIVACAINE HCL 2 MG/ML IJ SOLN
10.0000 mL/h | INTRAMUSCULAR | Status: AC
  Administered 2014-10-25: 10 mL/h via EPIDURAL
  Filled 2014-10-24 (×3): qty 200

## 2014-10-24 MED ORDER — PROPOFOL 10 MG/ML IV BOLUS
INTRAVENOUS | Status: DC | PRN
  Administered 2014-10-24: 200 mg via INTRAVENOUS

## 2014-10-24 MED ORDER — FENTANYL CITRATE 0.05 MG/ML IJ SOLN
50.0000 ug | Freq: Once | INTRAMUSCULAR | Status: AC
  Administered 2014-10-24: 100 ug via INTRAVENOUS

## 2014-10-24 MED ORDER — METHOCARBAMOL 1000 MG/10ML IJ SOLN
500.0000 mg | Freq: Four times a day (QID) | INTRAVENOUS | Status: DC | PRN
  Filled 2014-10-24: qty 5

## 2014-10-24 MED ORDER — ALPRAZOLAM 0.5 MG PO TABS
0.5000 mg | ORAL_TABLET | Freq: Three times a day (TID) | ORAL | Status: DC | PRN
  Administered 2014-10-26 (×2): 0.5 mg via ORAL
  Administered 2014-10-28 – 2014-10-29 (×4): 1 mg via ORAL
  Filled 2014-10-24: qty 1
  Filled 2014-10-24: qty 6
  Filled 2014-10-24 (×4): qty 2

## 2014-10-24 MED ORDER — METOCLOPRAMIDE HCL 10 MG PO TABS
5.0000 mg | ORAL_TABLET | Freq: Three times a day (TID) | ORAL | Status: DC | PRN
Start: 2014-10-24 — End: 2014-10-29
  Administered 2014-10-26: 10 mg via ORAL
  Filled 2014-10-24: qty 1

## 2014-10-24 MED ORDER — ACETAMINOPHEN 325 MG PO TABS
650.0000 mg | ORAL_TABLET | Freq: Four times a day (QID) | ORAL | Status: DC | PRN
  Administered 2014-10-28: 650 mg via ORAL
  Filled 2014-10-24: qty 2

## 2014-10-24 MED ORDER — ROPIVACAINE HCL 2 MG/ML IJ SOLN
10.0000 mL/h | INTRAMUSCULAR | Status: DC
  Administered 2014-10-24: 10 mL/h via EPIDURAL
  Filled 2014-10-24 (×2): qty 200

## 2014-10-24 MED ORDER — BUSPIRONE HCL 15 MG PO TABS
15.0000 mg | ORAL_TABLET | Freq: Every day | ORAL | Status: DC
  Administered 2014-10-25 – 2014-10-29 (×5): 15 mg via ORAL
  Filled 2014-10-24 (×5): qty 1

## 2014-10-24 MED ORDER — WARFARIN VIDEO: Freq: Once | Status: DC

## 2014-10-24 MED ORDER — BUPROPION HCL ER (XL) 150 MG PO TB24
450.0000 mg | ORAL_TABLET | Freq: Every morning | ORAL | Status: DC
  Administered 2014-10-25 – 2014-10-29 (×5): 450 mg via ORAL
  Filled 2014-10-24 (×6): qty 1

## 2014-10-24 MED ORDER — ACETAMINOPHEN 650 MG RE SUPP: 650.0000 mg | Freq: Four times a day (QID) | RECTAL | Status: DC | PRN

## 2014-10-24 MED ORDER — TRANEXAMIC ACID 100 MG/ML IV SOLN
2000.0000 mg | Freq: Once | INTRAVENOUS | Status: DC
  Filled 2014-10-24: qty 20

## 2014-10-24 MED ORDER — ACETAMINOPHEN 10 MG/ML IV SOLN
1000.0000 mg | Freq: Once | INTRAVENOUS | Status: AC
  Administered 2014-10-24: 1000 mg via INTRAVENOUS
  Filled 2014-10-24: qty 100

## 2014-10-24 MED ORDER — FENTANYL CITRATE 0.05 MG/ML IJ SOLN
INTRAMUSCULAR | Status: AC
  Filled 2014-10-24: qty 2

## 2014-10-24 MED ORDER — SODIUM CHLORIDE 0.9 % IR SOLN
Status: DC | PRN
  Administered 2014-10-24: 1000 mL

## 2014-10-24 MED ORDER — FLEET ENEMA 7-19 GM/118ML RE ENEM: 1.0000 | ENEMA | Freq: Once | RECTAL | Status: AC | PRN

## 2014-10-24 MED ORDER — WARFARIN SODIUM 5 MG PO TABS
5.0000 mg | ORAL_TABLET | Freq: Once | ORAL | Status: DC
  Filled 2014-10-24: qty 1

## 2014-10-24 MED ORDER — BUSPIRONE HCL 15 MG PO TABS
15.0000 mg | ORAL_TABLET | Freq: Two times a day (BID) | ORAL | Status: DC | PRN
Start: 2014-10-24 — End: 2014-10-29
  Filled 2014-10-24 (×2): qty 1

## 2014-10-24 MED ORDER — LACTATED RINGERS IV SOLN
INTRAVENOUS | Status: DC | PRN
  Administered 2014-10-24 (×3): via INTRAVENOUS

## 2014-10-24 MED ORDER — SODIUM CHLORIDE 0.9 % IV SOLN: INTRAVENOUS | Status: DC

## 2014-10-24 MED ORDER — ROCURONIUM BROMIDE 100 MG/10ML IV SOLN
INTRAVENOUS | Status: AC
  Filled 2014-10-24: qty 1

## 2014-10-24 MED ORDER — LORATADINE 10 MG PO TABS
10.0000 mg | ORAL_TABLET | Freq: Every day | ORAL | Status: DC
  Administered 2014-10-25 – 2014-10-29 (×5): 10 mg via ORAL
  Filled 2014-10-24 (×5): qty 1

## 2014-10-24 MED ORDER — BISACODYL 10 MG RE SUPP: 10.0000 mg | Freq: Every day | RECTAL | Status: DC | PRN

## 2014-10-24 MED ORDER — PANTOPRAZOLE SODIUM 40 MG PO TBEC
40.0000 mg | DELAYED_RELEASE_TABLET | Freq: Every day | ORAL | Status: DC
  Administered 2014-10-25 – 2014-10-29 (×5): 40 mg via ORAL
  Filled 2014-10-24 (×5): qty 1

## 2014-10-24 MED ORDER — MIDAZOLAM HCL 2 MG/2ML IJ SOLN
INTRAMUSCULAR | Status: DC | PRN
  Administered 2014-10-24: 2 mg via INTRAVENOUS

## 2014-10-24 MED ORDER — HYDROMORPHONE HCL 1 MG/ML IJ SOLN: 0.2500 mg | INTRAMUSCULAR | Status: DC | PRN

## 2014-10-24 MED ORDER — METOCLOPRAMIDE HCL 5 MG/ML IJ SOLN: 5.0000 mg | Freq: Three times a day (TID) | INTRAMUSCULAR | Status: DC | PRN

## 2014-10-24 MED ORDER — BUPIVACAINE HCL (PF) 0.5 % IJ SOLN
INTRAMUSCULAR | Status: DC | PRN
  Administered 2014-10-24: 10 mL
  Administered 2014-10-24: 7 mL via EPIDURAL
  Administered 2014-10-24: 12 mL

## 2014-10-24 MED ORDER — MENTHOL 3 MG MT LOZG: 1.0000 | LOZENGE | OROMUCOSAL | Status: DC | PRN

## 2014-10-24 MED ORDER — METOCLOPRAMIDE HCL 5 MG/ML IJ SOLN
INTRAMUSCULAR | Status: AC
  Filled 2014-10-24: qty 2

## 2014-10-24 MED ORDER — DEXAMETHASONE SODIUM PHOSPHATE 10 MG/ML IJ SOLN
10.0000 mg | Freq: Once | INTRAMUSCULAR | Status: AC
  Administered 2014-10-25: 10 mg via INTRAVENOUS
  Filled 2014-10-24: qty 1

## 2014-10-24 MED ORDER — KCL IN DEXTROSE-NACL 20-5-0.9 MEQ/L-%-% IV SOLN
INTRAVENOUS | Status: DC
  Administered 2014-10-24: 75 mL/h via INTRAVENOUS
  Administered 2014-10-25: 06:00:00 via INTRAVENOUS
  Administered 2014-10-25: 75 mL/h via INTRAVENOUS
  Filled 2014-10-24 (×6): qty 1000

## 2014-10-24 MED ORDER — MIDAZOLAM HCL 2 MG/2ML IJ SOLN
INTRAMUSCULAR | Status: AC
  Filled 2014-10-24: qty 2

## 2014-10-24 MED ORDER — BUPIVACAINE HCL (PF) 0.5 % IJ SOLN
INTRAMUSCULAR | Status: AC
  Filled 2014-10-24: qty 30

## 2014-10-24 MED ORDER — EPHEDRINE SULFATE 50 MG/ML IJ SOLN
INTRAMUSCULAR | Status: AC
  Filled 2014-10-24: qty 1

## 2014-10-24 MED ORDER — PROPOFOL 10 MG/ML IV BOLUS
INTRAVENOUS | Status: AC
  Filled 2014-10-24: qty 20

## 2014-10-24 MED ORDER — FENTANYL CITRATE 0.05 MG/ML IJ SOLN
INTRAMUSCULAR | Status: AC
  Filled 2014-10-24: qty 5

## 2014-10-24 MED ORDER — BUPIVACAINE HCL (PF) 0.25 % IJ SOLN
INTRAMUSCULAR | Status: AC
  Filled 2014-10-24: qty 30

## 2014-10-24 MED ORDER — MORPHINE SULFATE 2 MG/ML IJ SOLN
1.0000 mg | INTRAMUSCULAR | Status: DC | PRN
  Administered 2014-10-25: 2 mg via INTRAVENOUS
  Filled 2014-10-24: qty 1

## 2014-10-24 MED ORDER — TRAZODONE HCL 50 MG PO TABS
100.0000 mg | ORAL_TABLET | Freq: Every evening | ORAL | Status: DC | PRN
Start: 2014-10-24 — End: 2014-10-29
  Filled 2014-10-24: qty 2

## 2014-10-24 MED ORDER — CEFAZOLIN SODIUM-DEXTROSE 2-3 GM-% IV SOLR
2.0000 g | Freq: Four times a day (QID) | INTRAVENOUS | Status: AC
  Administered 2014-10-24 – 2014-10-25 (×2): 2 g via INTRAVENOUS
  Filled 2014-10-24 (×2): qty 50

## 2014-10-24 MED ORDER — CHLORHEXIDINE GLUCONATE 4 % EX LIQD: 60.0000 mL | Freq: Once | CUTANEOUS | Status: DC

## 2014-10-24 MED ORDER — FENTANYL CITRATE 0.05 MG/ML IJ SOLN
INTRAMUSCULAR | Status: DC | PRN
  Administered 2014-10-24: 100 ug via INTRAVENOUS
  Administered 2014-10-24: 50 ug via INTRAVENOUS

## 2014-10-24 MED ORDER — ONDANSETRON HCL 4 MG/2ML IJ SOLN: 4.0000 mg | Freq: Once | INTRAMUSCULAR | Status: DC | PRN

## 2014-10-24 MED ORDER — OXYCODONE HCL 5 MG PO TABS
5.0000 mg | ORAL_TABLET | ORAL | Status: DC | PRN
  Administered 2014-10-24: 10 mg via ORAL
  Administered 2014-10-25: 5 mg via ORAL
  Administered 2014-10-25: 10 mg via ORAL
  Administered 2014-10-25: 5 mg via ORAL
  Administered 2014-10-25: 20 mg via ORAL
  Administered 2014-10-25: 10 mg via ORAL
  Administered 2014-10-25: 15 mg via ORAL
  Administered 2014-10-25: 5 mg via ORAL
  Administered 2014-10-25: 10 mg via ORAL
  Administered 2014-10-26 (×3): 20 mg via ORAL
  Administered 2014-10-26: 10 mg via ORAL
  Administered 2014-10-26 (×2): 20 mg via ORAL
  Administered 2014-10-26: 10 mg via ORAL
  Administered 2014-10-27 – 2014-10-29 (×12): 20 mg via ORAL
  Filled 2014-10-24: qty 4
  Filled 2014-10-24: qty 2
  Filled 2014-10-24 (×4): qty 4
  Filled 2014-10-24: qty 2
  Filled 2014-10-24 (×6): qty 4
  Filled 2014-10-24: qty 2
  Filled 2014-10-24: qty 1
  Filled 2014-10-24: qty 4
  Filled 2014-10-24: qty 2
  Filled 2014-10-24: qty 4
  Filled 2014-10-24: qty 2
  Filled 2014-10-24 (×2): qty 4
  Filled 2014-10-24: qty 3
  Filled 2014-10-24: qty 4
  Filled 2014-10-24 (×2): qty 2
  Filled 2014-10-24 (×3): qty 4

## 2014-10-24 MED ORDER — METOCLOPRAMIDE HCL 5 MG/ML IJ SOLN
INTRAMUSCULAR | Status: DC | PRN
  Administered 2014-10-24: 10 mg via INTRAVENOUS

## 2014-10-24 MED ORDER — COUMADIN BOOK
Freq: Once | Status: AC
  Administered 2014-10-24: 1
  Filled 2014-10-24: qty 1

## 2014-10-24 MED ORDER — ROPIVACAINE HCL 2 MG/ML IJ SOLN: 10.0000 mL/h | INTRAMUSCULAR | Status: DC

## 2014-10-24 MED ORDER — METHOCARBAMOL 500 MG PO TABS
500.0000 mg | ORAL_TABLET | Freq: Four times a day (QID) | ORAL | Status: DC | PRN
  Administered 2014-10-24 – 2014-10-29 (×14): 500 mg via ORAL
  Filled 2014-10-24 (×15): qty 1

## 2014-10-24 MED ORDER — POLYETHYLENE GLYCOL 3350 17 G PO PACK
17.0000 g | PACK | Freq: Every day | ORAL | Status: DC | PRN
  Administered 2014-10-27 – 2014-10-28 (×2): 17 g via ORAL
  Filled 2014-10-24 (×2): qty 1

## 2014-10-24 MED ORDER — ACETAMINOPHEN 500 MG PO TABS
1000.0000 mg | ORAL_TABLET | Freq: Four times a day (QID) | ORAL | Status: AC
  Administered 2014-10-24 – 2014-10-25 (×4): 1000 mg via ORAL
  Filled 2014-10-24 (×4): qty 2

## 2014-10-24 MED ORDER — ONDANSETRON HCL 4 MG PO TABS
4.0000 mg | ORAL_TABLET | Freq: Four times a day (QID) | ORAL | Status: DC | PRN
  Administered 2014-10-27 – 2014-10-29 (×3): 4 mg via ORAL
  Filled 2014-10-24 (×3): qty 1

## 2014-10-24 MED ORDER — ONDANSETRON HCL 4 MG/2ML IJ SOLN
INTRAMUSCULAR | Status: DC | PRN
  Administered 2014-10-24: 4 mg via INTRAVENOUS

## 2014-10-24 SURGICAL SUPPLY — 56 items
BAG ZIPLOCK 12X15 (MISCELLANEOUS) ×4 IMPLANT
BANDAGE ELASTIC 6 VELCRO ST LF (GAUZE/BANDAGES/DRESSINGS) ×4 IMPLANT
BANDAGE ESMARK 6X9 LF (GAUZE/BANDAGES/DRESSINGS) ×2 IMPLANT
BLADE SAG 18X100X1.27 (BLADE) ×4 IMPLANT
BLADE SAW SGTL 11.0X1.19X90.0M (BLADE) ×4 IMPLANT
BLADE SURG SZ10 CARB STEEL (BLADE) ×4 IMPLANT
BNDG COHESIVE 6X5 TAN STRL LF (GAUZE/BANDAGES/DRESSINGS) ×4 IMPLANT
BNDG ESMARK 6X9 LF (GAUZE/BANDAGES/DRESSINGS) ×4
BOWL SMART MIX CTS (DISPOSABLE) ×4 IMPLANT
CAPT KNEE TOTAL 3 ATTUNE ×4 IMPLANT
CEMENT HV SMART SET (Cement) ×8 IMPLANT
CUFF TOURN SGL QUICK 34 (TOURNIQUET CUFF) ×2
CUFF TRNQT CYL 34X4X40X1 (TOURNIQUET CUFF) ×2 IMPLANT
DRAPE EXTREMITY BILATERAL (DRAPE) ×2 IMPLANT
DRAPE INCISE IOBAN 66X45 STRL (DRAPES) ×4 IMPLANT
DRAPE POUCH INSTRU U-SHP 10X18 (DRAPES) ×2 IMPLANT
DRAPE U-SHAPE 47X51 STRL (DRAPES) ×6 IMPLANT
DRSG ADAPTIC 3X8 NADH LF (GAUZE/BANDAGES/DRESSINGS) ×4 IMPLANT
DRSG PAD ABDOMINAL 8X10 ST (GAUZE/BANDAGES/DRESSINGS) ×4 IMPLANT
DURAPREP 26ML APPLICATOR (WOUND CARE) ×4 IMPLANT
ELECT REM PT RETURN 9FT ADLT (ELECTROSURGICAL) ×2
ELECTRODE REM PT RTRN 9FT ADLT (ELECTROSURGICAL) ×1 IMPLANT
EVACUATOR 1/8 PVC DRAIN (DRAIN) ×4 IMPLANT
FACESHIELD WRAPAROUND (MASK) ×16 IMPLANT
GAUZE SPONGE 4X4 12PLY STRL (GAUZE/BANDAGES/DRESSINGS) ×4 IMPLANT
GLOVE BIO SURGEON STRL SZ7.5 (GLOVE) ×4 IMPLANT
GLOVE BIO SURGEON STRL SZ8 (GLOVE) ×4 IMPLANT
GLOVE BIOGEL PI IND STRL 8 (GLOVE) ×2 IMPLANT
GLOVE BIOGEL PI INDICATOR 8 (GLOVE) ×2
GOWN STRL REUS W/TWL LRG LVL3 (GOWN DISPOSABLE) ×2 IMPLANT
GOWN STRL REUS W/TWL XL LVL3 (GOWN DISPOSABLE) ×12 IMPLANT
HANDPIECE INTERPULSE COAX TIP (DISPOSABLE) ×1
IMMOBILIZER KNEE 20 (SOFTGOODS) ×4 IMPLANT
IMMOBILIZER KNEE 20 THIGH 36 (SOFTGOODS) ×1 IMPLANT
KIT BASIN OR (CUSTOM PROCEDURE TRAY) ×2 IMPLANT
MANIFOLD NEPTUNE II (INSTRUMENTS) ×2 IMPLANT
NDL SAFETY ECLIPSE 18X1.5 (NEEDLE) ×2 IMPLANT
NEEDLE HYPO 18GX1.5 SHARP (NEEDLE) ×2
NS IRRIG 1000ML POUR BTL (IV SOLUTION) ×2 IMPLANT
PACK TOTAL JOINT (CUSTOM PROCEDURE TRAY) ×2 IMPLANT
PADDING CAST COTTON 6X4 STRL (CAST SUPPLIES) ×12 IMPLANT
SET HNDPC FAN SPRY TIP SCT (DISPOSABLE) ×1 IMPLANT
SPONGE LAP 18X18 X RAY DECT (DISPOSABLE) ×2 IMPLANT
STOCKINETTE 8 INCH (MISCELLANEOUS) ×2 IMPLANT
STRIP CLOSURE SKIN 1/2X4 (GAUZE/BANDAGES/DRESSINGS) ×4 IMPLANT
SUCTION FRAZIER 12FR DISP (SUCTIONS) ×2 IMPLANT
SUT MNCRL AB 4-0 PS2 18 (SUTURE) ×6 IMPLANT
SUT VIC AB 2-0 CT1 27 (SUTURE) ×7
SUT VIC AB 2-0 CT1 TAPERPNT 27 (SUTURE) ×7 IMPLANT
SUT VLOC 180 0 24IN GS25 (SUTURE) ×4 IMPLANT
SYR 20CC LL (SYRINGE) IMPLANT
SYR 50ML LL SCALE MARK (SYRINGE) ×4 IMPLANT
TOWEL OR 17X26 10 PK STRL BLUE (TOWEL DISPOSABLE) ×4 IMPLANT
TRAY FOLEY CATH 14FRSI W/METER (CATHETERS) ×2 IMPLANT
WATER STERILE IRR 1500ML POUR (IV SOLUTION) ×2 IMPLANT
WRAP KNEE MAXI GEL POST OP (GAUZE/BANDAGES/DRESSINGS) ×4 IMPLANT

## 2014-10-24 NOTE — Progress Notes (Signed)
ANTICOAGULATION CONSULT NOTE - Initial Consult  Pharmacy Consult for warfarin Indication: VTE prophylaxis  Allergies  Allergen Reactions  . Doxycycline Nausea And Vomiting  . Dust Mite Extract     UNKNOWN  . Mold Extract [Trichophyton Mentagrophyte]     UNKNOWN  . Nsaids Other (See Comments)    GI UPSET   . Tetracyclines & Related Nausea And Vomiting    Patient Measurements: Height: 5\' 10"  (177.8 cm) Weight: 290 lb (131.543 kg) IBW/kg (Calculated) : 68.5  Vital Signs: Temp: 97.9 F (36.6 C) (04/13 1528) Temp Source: Oral (04/13 1528) BP: 116/61 mmHg (04/13 1528) Pulse Rate: 62 (04/13 1528)  Labs: No results for input(s): HGB, HCT, PLT, APTT, LABPROT, INR, HEPARINUNFRC, CREATININE, CKTOTAL, CKMB, TROPONINI in the last 72 hours.  Estimated Creatinine Clearance: 123.1 mL/min (by C-G formula based on Cr of 0.73).   Medical History: Past Medical History  Diagnosis Date  . H/O varicella   . Yeast infection   . Menses, irregular 07/02/10  . Menopausal symptoms 10/14/10  . Vulvar itching 04/14/11  . Hydradenitis 04/14/11  . Complication of anesthesia     Nausea  & vomiting during eye surgery  . PONV (postoperative nausea and vomiting)   . Family history of adverse reaction to anesthesia     sister has problems with nausea and vomiting   . Hypertension     hx of hypertension no longer on meds   . Asthma     greater than 20 years ago   . Anxiety   . Depression   . GERD (gastroesophageal reflux disease)   . Arthritis   . Cancer     hx of skin cancer on back   . DVT (deep venous thrombosis)     12/2011     Medications:  Scheduled:  . acetaminophen  1,000 mg Oral 4 times per day  . [START ON 10/25/2014] buPROPion  450 mg Oral q morning - 10a  . [START ON 10/25/2014] busPIRone  15 mg Oral Daily  .  ceFAZolin (ANCEF) IV  2 g Intravenous Q6H  . [START ON 10/25/2014] dexamethasone  10 mg Intravenous Once  . docusate sodium  100 mg Oral BID  . [START ON 10/25/2014]  loratadine  10 mg Oral Daily  . [START ON 10/25/2014] pantoprazole  40 mg Oral Daily   Infusions:  . dextrose 5 % and 0.9 % NaCl with KCl 20 mEq/L    . ropivacaine (PF) 2 mg/ml (0.2%)      Assessment: 54 yo female s/p bilateral TKA started on epidural post op. To start warfarin per pharmacy dosing tomorrow 4/14 at 10am. Pre-op INR 1.13 and CBC stable   Goal of Therapy:  INR 2-3    Plan:  5mg  warfarin to start 10am tomorrow 4/14 per orders Daily INR Will send warfarin education materials and order video   Adrian Saran, PharmD, BCPS Pager [phone] 10/24/2014 4:06 PM

## 2014-10-24 NOTE — Progress Notes (Signed)
PACU note---assessment of spinal level----pt level L1 but she can move feet, feel you touch feet, and feel ortho tech touching back of calf while applying CPM; Dr. Tamala Julian notified and OK for pt to go up to hospital room upstairs for further care

## 2014-10-24 NOTE — Anesthesia Procedure Notes (Addendum)
Procedure Name: Intubation Date/Time: 10/24/2014 11:36 AM Performed by: Deliah Boston Pre-anesthesia Checklist: Patient identified, Emergency Drugs available, Suction available and Patient being monitored Patient Re-evaluated:Patient Re-evaluated prior to inductionOxygen Delivery Method: Circle System Utilized Preoxygenation: Pre-oxygenation with 100% oxygen Intubation Type: IV induction Ventilation: Mask ventilation without difficulty Laryngoscope Size: Mac and 3 Grade View: Grade I Tube type: Oral Tube size: 7.5 mm Number of attempts: 1 Airway Equipment and Method: Oral airway Placement Confirmation: ETT inserted through vocal cords under direct vision,  positive ETCO2 and breath sounds checked- equal and bilateral Secured at: 21 cm Tube secured with: Tape Dental Injury: Teeth and Oropharynx as per pre-operative assessment    Epidural Patient location during procedure: pre-op Start time: 10/24/2014 11:10 AM End time: 10/24/2014 11:20 AM  Preanesthetic Checklist Completed: patient identified, site marked, surgical consent, pre-op evaluation, timeout performed, IV checked, risks and benefits discussed, monitors and equipment checked and at surgeon's request  Epidural Patient position: sitting Prep: Betadine Patient monitoring: heart rate, cardiac monitor and continuous pulse ox Approach: midline Location: L2-L3 Injection technique: LOR air  Needle:  Needle type: Tuohy  Needle gauge: 18 G Needle length: 9 cm Needle insertion depth: 8 cm Catheter type: closed end flexible Catheter size: 20 Guage Test dose: negative  Additional Notes Pt accepts procedure w/ risks. Pt tolerated epidural w/ placement of catheter w/o difficulty and mild discomfort. GESReason for block:at surgeon's request

## 2014-10-24 NOTE — Op Note (Signed)
Pre-operative diagnosis- Osteoarthritis  Bilateral knee(s)  Post-operative diagnosis- Osteoarthritis Bilateral knee(s)  Procedure-  Bilateral  Total Knee Arthroplasty  Surgeon- Dione Plover. Talaysia Pinheiro, MD  Assistant- Arlee Muslim, PA-C   Anesthesia-  Epidural  EBL-* No blood loss amount entered *   Drains Hemovac x 1 each side  Tourniquet time-  Total Tourniquet Time Documented: Thigh (N/A) - 38 minutes Thigh (N/A) - 37 minutes Total: Thigh (N/A) - 75 minutes     Complications- None  Condition-PACU - hemodynamically stable.   Brief Clinical Note  Kendra Haney is a 54 y.o. year old female with end stage OA of both knees with progressively worsening pain and dysfunction. He has constant pain, with activity and at rest and significant functional deficits with difficulties even with ADLs. He has had extensive non-op management including analgesics, injections of cortisone and viscosupplements, and home exercise program, but remains in significant pain with significant dysfunction. We discussed replacing both knees in the same setting versus one at a time including procedure, risks, potential complications, rehab course, and pros and cons associated with each and the patient elects to do both knees at the same time. She presents now for bilateral Total Knee Arthroplasty.     Procedure in detail---   The patient is brought into the operating room and positioned supine on the operating table. After successful administration of  Epidural,   a tourniquet is placed high on the  Bilateral thigh(s) and the lower extremities are prepped and draped in the usual sterile fashion. Time out is performed by the operating team and then the Left lower extremity is wrapped in Esmarch, knee flexed and the tourniquet inflated to 300 mmHg.       A midline incision is made with a ten blade through the subcutaneous tissue to the level of the extensor mechanism. A fresh blade is used to make a medial parapatellar  arthrotomy. Soft tissue over the proximal medial tibia is subperiosteally elevated to the joint line with a knife and into the semimembranosus bursa with a Cobb elevator. Soft tissue over the proximal lateral tibia is elevated with attention being paid to avoiding the patellar tendon on the tibial tubercle. The patella is everted, knee flexed 90 degrees and the ACL and PCL are removed. Findings are bone on bone all 3 compartments with massive global osteophytes.        The drill is used to create a starting hole in the distal femur and the canal is thoroughly irrigated with sterile saline to remove the fatty contents. The 5 degree Left  valgus alignment guide is placed into the femoral canal and the distal femoral cutting block is pinned to remove 10 mm off the distal femur. Resection is made with an oscillating saw.      The tibia is subluxed forward and the menisci are removed. The extramedullary alignment guide is placed referencing proximally at the medial aspect of the tibial tubercle and distally along the second metatarsal axis and tibial crest. The block is pinned to remove 21mm off the more deficient medial  side. Resection is made with an oscillating saw. Size 6is the most appropriate size for the tibia and the proximal tibia is prepared with the modular drill and keel punch for that size.      The femoral sizing guide is placed and size 6 is most appropriate. Rotation is marked off the epicondylar axis and confirmed by creating a rectangular flexion gap at 90 degrees. The size 6 cutting block is  pinned in this rotation and the anterior, posterior and chamfer cuts are made with the oscillating saw. The intercondylar block is then placed and that cut is made.      Trial size 6 tibial component, trial size 6 posterior stabilized femur and a 8  mm posterior stabilized rotating platform insert trial is placed. Full extension is achieved with excellent varus/valgus and anterior/posterior balance throughout  full range of motion. The patella is everted and thickness measured to be 24  mm. Free hand resection is taken to 14 mm, a 38 template is placed, lug holes are drilled, trial patella is placed, and it tracks normally. Osteophytes are removed off the posterior femur with the trial in place. All trials are removed and the cut bone surfaces prepared with pulsatile lavage. Cement is mixed and once ready for implantation, the size 6 tibial implant, size  6 posterior stabilized femoral component, and the size 38 patella are cemented in place and the patella is held with the clamp. The trial insert is placed and the knee held in full extension.  All extruded cement is removed and once the cement is hard the permanent 8 mm posterior stabilized rotating platform insert is placed into the tibial tray.      The wound is copiously irrigated with saline solution and the extensor mechanism closed over a hemovac drain with #1 V-loc suture. The tourniquet is released for a total tourniquet time of 38  minutes. Flexion against gravity is 135 degrees and the patella tracks normally. Subcutaneous tissue is closed with 2.0 vicryl and subcuticular with running 4.0 Monocryl.       The  Right lower extremity is wrapped in Esmarch, knee flexed and the tourniquet inflated to 300 mmHg.       A midline incision is made with a ten blade through the subcutaneous tissue to the level of the extensor mechanism. A fresh blade is used to make a medial parapatellar arthrotomy. Soft tissue over the proximal medial tibia is subperiosteally elevated to the joint line with a knife and into the semimembranosus bursa with a Cobb elevator. Soft tissue over the proximal lateral tibia is elevated with attention being paid to avoiding the patellar tendon on the tibial tubercle. The patella is everted, knee flexed 90 degrees and the ACL and PCL are removed. Findings are  bone on bone all 3 compartments with massive global osteophytes.          The drill is  used to create a starting hole in the distal femur and the canal is thoroughly irrigated with sterile saline to remove the fatty contents. The 5 degree Right  valgus alignment guide is placed into the femoral canal and the distal femoral cutting block is pinned to remove 10 mm off the distal femur. Resection is made with an oscillating saw.      The tibia is subluxed forward and the menisci are removed. The extramedullary alignment guide is placed referencing proximally at the medial aspect of the tibial tubercle and distally along the second metatarsal axis and tibial crest. The block is pinned to remove 27mm off the more deficient medial  side. Resection is made with an oscillating saw. Size 6is the most appropriate size for the tibia and the proximal tibia is prepared with the modular drill and keel punch for that size.      The femoral sizing guide is placed and size 6 is most appropriate. Rotation is marked off the epicondylar axis and confirmed by creating  a rectangular flexion gap at 90 degrees. The size 6 cutting block is pinned in this rotation and the anterior, posterior and chamfer cuts are made with the oscillating saw. The intercondylar block is then placed and that cut is made.      Trial size 6 tibial component, trial size 6 posterior stabilized femur and a 6  mm posterior stabilized rotating platform insert trial is placed. Full extension is achieved with excellent varus/valgus and anterior/posterior balance throughout full range of motion. The patella is everted and thickness measured to be 24  mm. Free hand resection is taken to 14 mm, a 38 template is placed, lug holes are drilled, trial patella is placed, and it tracks normally. Osteophytes are removed off the posterior femur with the trial in place. All trials are removed and the cut bone surfaces prepared with pulsatile lavage. Cement is mixed and once ready for implantation, the size 6 tibial implant, size  6 posterior stabilized femoral  component, and the size 38 patella are cemented in place and the patella is held with the clamp. The trial insert is placed and the knee held in full extension.   All extruded cement is removed and once the cement is hard the permanent 6 mm posterior stabilized rotating platform insert is placed into the tibial tray.      The wound is copiously irrigated with saline solution and the extensor mechanism closed over a hemovac drain with #1 V-loc suture. The tourniquet is released for a total tourniquet time of 37  minutes. Flexion against gravity is 135 degrees and the patella tracks normally. Subcutaneous tissue is closed with 2.0 vicryl and subcuticular with running 4.0 Monocryl. The incisions are cleaned and dried and steri-strips and  bulky sterile dressings are applied. The limbs are placed into knee immobilizers and the patient is awakened and transported to recovery in stable condition.            Please note that a surgical assistant was a medical necessity for this procedure in order to perform it in a safe and expeditious manner. Surgical assistant was necessary to retract the ligaments and vital neurovascular structures to prevent injury to them and also necessary for proper positioning of the limb to allow for anatomic placement of the prosthesis.   Dione Plover Taja Pentland, MD    10/24/2014, 1:43 PM

## 2014-10-24 NOTE — Interval H&P Note (Signed)
History and Physical Interval Note:  10/24/2014 11:24 AM  Kendra Haney  has presented today for surgery, with the diagnosis of OA BILATERAL KNEES  The various methods of treatment have been discussed with the patient and family. After consideration of risks, benefits and other options for treatment, the patient has consented to  Procedure(s): TOTAL KNEE BILATERAL (Bilateral) as a surgical intervention .  The patient's history has been reviewed, patient examined, no change in status, stable for surgery.  I have reviewed the patient's chart and labs.  Questions were answered to the patient's satisfaction.     [name]

## 2014-10-24 NOTE — Anesthesia Preprocedure Evaluation (Addendum)
Anesthesia Evaluation  Patient identified by MRN, date of birth, ID band Patient awake    Reviewed: Allergy & Precautions, NPO status , Patient's Chart, lab work & pertinent test results  History of Anesthesia Complications (+) PONV  Airway Mallampati: I       Dental   Pulmonary asthma ,    Pulmonary exam normal       Cardiovascular hypertension, Rhythm:Regular Rate:Normal     Neuro/Psych Anxiety Depression    GI/Hepatic GERD-  ,  Endo/Other  Morbid obesity  Renal/GU      Musculoskeletal  (+) Arthritis -,   Abdominal   Peds  Hematology   Anesthesia Other Findings   Reproductive/Obstetrics                           Anesthesia Physical Anesthesia Plan  ASA: III  Anesthesia Plan: General   Post-op Pain Management:    Induction: Intravenous  Airway Management Planned: Oral ETT  Additional Equipment:   Intra-op Plan:   Post-operative Plan: Extubation in OR  Informed Consent: I have reviewed the patients History and Physical, chart, labs and discussed the procedure including the risks, benefits and alternatives for the proposed anesthesia with the patient or authorized representative who has indicated his/her understanding and acceptance.     Plan Discussed with: CRNA, Anesthesiologist and Surgeon  Anesthesia Plan Comments:         Anesthesia Quick Evaluation

## 2014-10-24 NOTE — Anesthesia Postprocedure Evaluation (Signed)
  Anesthesia Post-op Note  Patient: Kendra Haney  Procedure(s) Performed: Procedure(s) with comments: TOTAL KNEE BILATERAL (Bilateral) - with epidural  Patient Location: PACU  Anesthesia Type:General and Regional  Level of Consciousness: awake, alert , oriented and patient cooperative  Airway and Oxygen Therapy: Patient Spontanous Breathing  Post-op Pain: none  Post-op Assessment: Post-op Vital signs reviewed, Patient's Cardiovascular Status Stable, Respiratory Function Stable, Patent Airway, No signs of Nausea or vomiting and Pain level controlled  Post-op Vital Signs: stable  Last Vitals:  Filed Vitals:   10/24/14 1528  BP: 116/61  Pulse: 62  Temp: 36.6 C  Resp: 16    Complications: No apparent anesthesia complications

## 2014-10-24 NOTE — Transfer of Care (Signed)
Immediate Anesthesia Transfer of Care Note  Patient: Kendra Haney  Procedure(s) Performed: Procedure(s) with comments: TOTAL KNEE BILATERAL (Bilateral) - with epidural  Patient Location: PACU  Anesthesia Type:General with epidural  Level of Consciousness: Patient easily awoken, sedated, comfortable, cooperative, following commands, responds to stimulation.   Airway & Oxygen Therapy: Patient spontaneously breathing, ventilating well, oxygen via simple oxygen mask.  Post-op Assessment: Report given to PACU RN, vital signs reviewed and stable, epidural level T12.   Post vital signs: Reviewed and stable.  Complications: No apparent anesthesia complications

## 2014-10-25 ENCOUNTER — Encounter (HOSPITAL_COMMUNITY): Payer: Self-pay | Admitting: Orthopedic Surgery

## 2014-10-25 DIAGNOSIS — M170 Bilateral primary osteoarthritis of knee: Principal | ICD-10-CM

## 2014-10-25 DIAGNOSIS — Z96653 Presence of artificial knee joint, bilateral: Secondary | ICD-10-CM

## 2014-10-25 LAB — BASIC METABOLIC PANEL
ANION GAP: 3 — AB (ref 5–15)
BUN: 8 mg/dL (ref 6–23)
CALCIUM: 8.1 mg/dL — AB (ref 8.4–10.5)
CHLORIDE: 108 mmol/L (ref 96–112)
CO2: 27 mmol/L (ref 19–32)
Creatinine, Ser: 0.55 mg/dL (ref 0.50–1.10)
GFR calc Af Amer: >90 mL/min (ref >90)
GFR calc non Af Amer: >90 mL/min (ref >90)
GLUCOSE: 130 mg/dL — AB (ref 70–99)
Potassium: 4.2 mmol/L (ref 3.5–5.1)
Sodium: 138 mmol/L (ref 135–145)

## 2014-10-25 LAB — PROTIME-INR
INR: 1.29 (ref 0.00–1.49)
PROTHROMBIN TIME: 16.2 s — AB (ref 11.6–15.2)

## 2014-10-25 LAB — CBC
HCT: 29.1 % — ABNORMAL LOW (ref 36.0–46.0)
Hemoglobin: 9.4 g/dL — ABNORMAL LOW (ref 12.0–15.0)
MCH: 28.1 pg (ref 26.0–34.0)
MCHC: 32.3 g/dL (ref 30.0–36.0)
MCV: 86.9 fL (ref 78.0–100.0)
Platelets: 170 10*3/uL (ref 150–400)
RBC: 3.35 MIL/uL — AB (ref 3.87–5.11)
RDW: 15.3 % (ref 11.5–15.5)
WBC: 7.4 10*3/uL (ref 4.0–10.5)

## 2014-10-25 MED ORDER — HYDROMORPHONE HCL 1 MG/ML IJ SOLN
0.5000 mg | INTRAMUSCULAR | Status: DC | PRN
  Administered 2014-10-25 – 2014-10-26 (×6): 1 mg via INTRAVENOUS
  Filled 2014-10-25 (×7): qty 1

## 2014-10-25 MED ORDER — WARFARIN SODIUM 4 MG PO TABS
4.0000 mg | ORAL_TABLET | Freq: Once | ORAL | Status: AC
  Administered 2014-10-25: 4 mg via ORAL
  Filled 2014-10-25: qty 1

## 2014-10-25 NOTE — Progress Notes (Signed)
CSW met with pt to assist with d/c planning. Pt is hoping to have rehab at [facility]. CSW will remain available to assist with d/c planning as needed.  Werner Lean LCSW [phone]

## 2014-10-25 NOTE — Consult Note (Signed)
Physical Medicine and Rehabilitation Consult Reason for Consult: Bilateral total knee arthroplasty secondary to end-stage osteoarthritis Referring Physician: Dr.Alusio    HPI: Kendra Haney is a 54 y.o.right handed  female with history of obesity, right popliteal vein DVT maintained on Xarelto June 2013 and has since been discontinued with follow-up Doppler studies negative. Independent prior to admission living alone. Admitted 10/24/2014 with end-stage osteoarthritis bilateral knees and no relief with conservative care including NSAIDS/analgesics, corticosteroid injections and activity modification. Onset of symptoms have been gradual over the last 8 years. Patient underwent bilateral total knee arthroplasty 10/24/2014 per Dr.Alusio. Hospital course pain management. Placed on Coumadin for DVT prophylaxis. Weightbearing as tolerated. Acute blood loss anemia 9.4 and monitored. Physical therapy evaluation completed 10/25/2014 with recommendations of physical medicine rehabilitation consult.   Has epidural catheter in, left leg seems like it is more sensate than right side Felt dizzy during transfers from bed to chair Review of Systems  Gastrointestinal:       GERD  Psychiatric/Behavioral: Positive for depression.       Anxiety  All other systems reviewed and are negative.  Past Medical History  Diagnosis Date  . H/O varicella   . Yeast infection   . Menses, irregular 07/02/10  . Menopausal symptoms 10/14/10  . Vulvar itching 04/14/11  . Hydradenitis 04/14/11  . Complication of anesthesia     Nausea  & vomiting during eye surgery  . PONV (postoperative nausea and vomiting)   . Family history of adverse reaction to anesthesia     sister has problems with nausea and vomiting   . Hypertension     hx of hypertension no longer on meds   . Asthma     greater than 20 years ago   . Anxiety   . Depression   . GERD (gastroesophageal reflux disease)   . Arthritis   . Cancer     hx of  skin cancer on back   . DVT (deep venous thrombosis)     12/2011    Past Surgical History  Procedure Laterality Date  . Wisdom tooth extraction    . 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 Tonsillectomy      54 years old   Family History  Problem Relation Age of Onset  . Hypertension Paternal Grandmother   . Cancer Father   . Hypertension Mother    Social History:  reports that she has never smoked. She has never used smokeless tobacco. She reports that she does not drink alcohol or use illicit drugs. Allergies:  Allergies  Allergen Reactions  . Doxycycline Nausea And Vomiting  . Dust Mite Extract     UNKNOWN  . Mold Extract [Trichophyton Mentagrophyte]     UNKNOWN  . Nsaids Other (See Comments)    GI UPSET   . Tetracyclines & Related Nausea And Vomiting   Medications Prior to Admission  Medication Sig Dispense Refill  . acetaminophen (TYLENOL) 500 MG tablet Take 650 mg by mouth every 6 (six) hours as needed for mild pain.     Marland Kitchen ALPRAZolam (XANAX) 1 MG tablet Take 0.5-3 mg by mouth 3 (three) times daily as needed for anxiety.     Marland Kitchen buPROPion (WELLBUTRIN XL) 150 MG 24 hr tablet Take 450 mg by mouth every morning. Pt takes 3 tabs daily.    . busPIRone (BUSPAR) 15 MG tablet Take 15 mg by mouth 3 (three) times daily as needed (  DEPRESSION). Patient takes on 15 mg in the am and then as needed the rest of the day.    . fexofenadine (ALLEGRA) 180 MG tablet Take 180 mg by mouth daily.    Marland Kitchen HYDROcodone-acetaminophen (NORCO) 7.5-325 MG per tablet Take 1 tablet by mouth every 6 (six) hours as needed for moderate pain.    Marland Kitchen NEXIUM 40 MG capsule TAKE 1 CAPSULE DAILY (Patient taking differently: patient takes 20 mg daily) 90 capsule 1  . traZODone (DESYREL) 100 MG tablet Take 100 mg by mouth at bedtime as needed for sleep.     Marland Kitchen triamcinolone cream (KENALOG) 0.1 % Apply 1 application topically 2 (two) times daily. As needed    . nitrofurantoin (MACRODANTIN) 100 MG capsule Take  100 mg by mouth 4 (four) times daily. Patient completed on 10/15/2014.      Home: Home Living Family/patient expects to be discharged to:: Inpatient rehab Living Arrangements: Alone  Functional History:   Functional Status:  Mobility:          ADL:    Cognition: Cognition Orientation Level: Oriented X4    Blood pressure 121/56, pulse 59, temperature 98.4 F (36.9 C), temperature source Oral, resp. rate 16, height 5\' 10"  (1.778 m), weight 131.543 kg (290 lb), last menstrual period 10/23/2014, SpO2 100 %. Physical Exam  Constitutional: She is oriented to person, place, and time.  HENT:  Head: Normocephalic.  Eyes: EOM are normal.  Neck: Normal range of motion. Neck supple. No thyromegaly present.  Cardiovascular: Normal rate and regular rhythm.   Respiratory: Effort normal and breath sounds normal. No respiratory distress.  GI: Soft. Bowel sounds are normal. She exhibits no distension.  Neurological: She is alert and oriented to person, place, and time.  Skin:  Bilateral knee incisions clean and dry appropriately tender  5/5 strength bilateral deltoid, biceps, triceps, grip 4/5 bilateral ankle dorsiflexor plantar flexor Hip flexor and extensor trace, pain inhibition Sensation to light touch bilateral feet  Results for orders placed or performed during the hospital encounter of 10/24/14 (from the past 24 hour(s))  Type and screen     Status: None   Collection Time: 10/24/14  9:05 AM  Result Value Ref Range   ABO/RH(D) A POS    Antibody Screen NEG    Sample Expiration 10/27/2014   ABO/Rh     Status: None   Collection Time: 10/24/14  9:05 AM  Result Value Ref Range   ABO/RH(D) A POS   CBC     Status: Abnormal   Collection Time: 10/25/14  5:22 AM  Result Value Ref Range   WBC 7.4 4.0 - 10.5 K/uL   RBC 3.35 (L) 3.87 - 5.11 MIL/uL   Hemoglobin 9.4 (L) 12.0 - 15.0 g/dL   HCT 29.1 (L) 36.0 - 46.0 %   MCV 86.9 78.0 - 100.0 fL   MCH 28.1 26.0 - 34.0 pg   MCHC 32.3  30.0 - 36.0 g/dL   RDW 15.3 11.5 - 15.5 %   Platelets 170 150 - 400 K/uL  Protime-INR     Status: Abnormal   Collection Time: 10/25/14  5:22 AM  Result Value Ref Range   Prothrombin Time 16.2 (H) 11.6 - 15.2 seconds   INR 1.29 0.00 - 1.49   No results found.  Assessment/Plan: Diagnosis: End-stage osteoarthritis of bilateral knees status post total knee replacements postoperative day #1 1. Does the need for close, 24 hr/day medical supervision in concert with the patient's rehab needs make it unreasonable for  this patient to be served in a less intensive setting? Yes 2. Co-Morbidities requiring supervision/potential complications: Obesity, history of DVT, hypertension 3. Due to bladder management, bowel management, safety, skin/wound care, disease management, medication administration, pain management and patient education, does the patient require 24 hr/day rehab nursing? Yes 4. Does the patient require coordinated care of a physician, rehab nurse, PT (1-2 hrs/day, 5 days/week) and OT (1-2 hrs/day, 5 days/week) to address physical and functional deficits in the context of the above medical diagnosis(es)? Yes Addressing deficits in the following areas: balance, endurance, locomotion, strength, transferring, bowel/bladder control, bathing, dressing and toileting 5. Can the patient actively participate in an intensive therapy program of at least 3 hrs of therapy per day at least 5 days per week? Potentially 6. The potential for patient to make measurable gains while on inpatient rehab is good 7. Anticipated functional outcomes upon discharge from inpatient rehab are modified independent  with PT, modified independent with OT, n/a with SLP. 8. Estimated rehab length of stay to reach the above functional goals is: 7 days 9. Does the patient have adequate social supports and living environment to accommodate these discharge functional goals? Potentially 10. Anticipated D/C setting:  Home 11. Anticipated post D/C treatments: [place] therapy 12. Overall Rehab/Functional Prognosis: excellent  RECOMMENDATIONS: This patient's condition is appropriate for continued rehabilitative care in the following setting: [facility] if still requiring physical assistance after epidural catheter is removed Patient has agreed to participate in recommended program. Yes Note that insurance prior authorization may be required for reimbursement for recommended care.  Comment: Rehabilitation admission coordinator to follow-up therapy progress    10/25/2014

## 2014-10-25 NOTE — Evaluation (Addendum)
Physical Therapy Evaluation Patient Details Name: Kendra Haney MRN: [id number] DOB: 1960/09/22 Today's Date: 10/25/2014   History of Present Illness  s/p bil TKA; PMHx:  HTN, obesity  Clinical Impression  .Pt admitted with above diagnosis. Pt currently with functional limitations due to the deficits listed below (see PT Problem List). Pt will benefit from skilled PT to increase their independence and safety with mobility to allow discharge to the venue listed below.  Pt would benefit from CIR therapies post acute, will follow     Follow Up Recommendations CIR    Equipment Recommendations  Rolling walker with 5" wheels (needs wide RW)    Recommendations for Other Services       Precautions / Restrictions Precautions Precautions: Knee Restrictions Other Position/Activity Restrictions: WBAT bil KIs until SLR      Mobility  Bed Mobility Overal bed mobility: Needs Assistance;+ 2 for safety/equipment Bed Mobility: Supine to Sit     Supine to sit: Mod assist;+2 for safety/equipment     General bed mobility comments: cues for technique, use of lower rail on pt R side, assist with bil LEs  Transfers Overall transfer level: Needs assistance Equipment used: Rolling walker (2 wheeled) Transfers: Sit to/from [facility] Sit to Stand: +2 physical assistance;Mod assist;+2 safety/equipment;From elevated surface Stand pivot transfers: Mod assist;+2 physical assistance;+2 safety/equipment       General transfer comment: multi-modal cues for technique, +2 for safety and to wt shift to stand  Ambulation/Gait Ambulation/Gait assistance: +2 physical assistance;Mod assist;Min assist;+2 safety/equipment Ambulation Distance (Feet): 4 Feet (pivotal steps) Assistive device: Rolling walker (2 wheeled)       General Gait Details: cues for RW position, breathing, safety and wt shift  Stairs            Wheelchair Mobility    Modified Rankin (Stroke Patients  Only)       Balance Overall balance assessment: Needs assistance         Standing balance support: During functional activity;Bilateral upper extremity supported Standing balance-Leahy Scale: Poor                               Pertinent Vitals/Pain Pain Assessment: 0-10 Pain Score: 4  Pain Location: bil knees, L>R Pain Descriptors / Indicators: Aching;Sore Pain Intervention(s): Limited activity within patient's tolerance;Monitored during session;Other (comment) (epidural)    Home Living Family/patient expects to be discharged to:: Inpatient rehab Living Arrangements: Alone                    Prior Function Level of Independence: Independent         Comments: pt has toilet riser and borrowed shower chair     Hand Dominance        Extremity/Trunk Assessment   Upper Extremity Assessment: Defer to OT evaluation           Lower Extremity Assessment: RLE deficits/detail;LLE deficits/detail RLE Deficits / Details: ankle WFL; knee extension and hip flexion grossly 2/5, limited by pain LLE Deficits / Details: same as above although more painful than RLE     Communication   Communication: No difficulties  Cognition Arousal/Alertness: Awake/alert Behavior During Therapy: WFL for tasks assessed/performed Overall Cognitive Status: Within Functional Limits for tasks assessed                      General Comments      Exercises Total Joint Exercises Ankle Circles/Pumps: AROM;Both;10  reps Quad Sets: Both;5 reps;AROM Straight Leg Raises: AAROM;Both;5 reps      Assessment/Plan    PT Assessment Patient needs continued PT services  PT Diagnosis Difficulty walking   PT Problem List Decreased strength;Decreased range of motion;Decreased activity tolerance;Decreased balance;Decreased mobility;Decreased knowledge of precautions;Decreased knowledge of use of DME;Obesity  PT Treatment Interventions DME instruction;Gait training;Functional  mobility training;Therapeutic activities;Patient/family education;Therapeutic exercise;Balance training   PT Goals (Current goals can be found in the Care Plan section) Acute Rehab PT Goals Patient Stated Goal: to go to CIR PT Goal Formulation: With patient Time For Goal Achievement: 11/02/14 Potential to Achieve Goals: Good    Frequency 7X/week   Barriers to discharge        Co-evaluation               End of Session Equipment Utilized During Treatment: Gait belt Activity Tolerance: Treatment limited secondary to medical complications (Comment) (nausea and pain) Patient left: in chair;with call bell/phone within reach;with nursing/sitter in room Nurse Communication: Mobility status         Time: 1140-1209 PT Time Calculation (min) (ACUTE ONLY): 29 min   Charges:   PT Evaluation $Initial PT Evaluation Tier I: 1 Procedure PT Treatments $Therapeutic Activity: 8-22 mins   PT G Codes:        Hurley Blevins 2014-11-24, 12:14 PM

## 2014-10-25 NOTE — Progress Notes (Signed)
Post-Anesth check Day 1 Epidural rate at 10 cc , 0.2% ropivicaine, Doing well though some breakthrough pain after PT today.  Has received Dilaudid for this. Plan : remove catheter in Am

## 2014-10-25 NOTE — Progress Notes (Signed)
   Subjective: 1 Day Post-Op Procedure(s) (LRB): TOTAL KNEE BILATERAL (Bilateral) Patient reports pain as mild.  Epidural working well. Pain relief better on right We will start therapy today.  Plan is to go Rehab after hospital stay.  Objective: Vital signs in last 24 hours: Temp:  [97.5 F (36.4 C)-98.5 F (36.9 C)] 98.4 F (36.9 C) (04/14 0140) Pulse Rate:  [55-72] 59 (04/14 0140) Resp:  [13-18] 14 (04/14 0553) BP: (102-148)/(45-72) 121/56 mmHg (04/14 0140) SpO2:  [97 %-100 %] 100 % (04/14 0553) Weight:  [131.543 kg (290 lb)] 131.543 kg (290 lb) (04/13 1528)  Intake/Output from previous day:  Intake/Output Summary (Last 24 hours) at 10/25/14 0626 Last data filed at 10/25/14 0545  Gross per 24 hour  Intake   3710 ml  Output   3986 ml  Net   -276 ml    Intake/Output this shift: Total I/O In: 1270 [P.O.:360; I.V.:910] Out: 1021 [Urine:900; Drains:121]  Labs:  Recent Labs  10/25/14 0522  HGB 9.4*    Recent Labs  10/25/14 0522  WBC 7.4  RBC 3.35*  HCT 29.1*  PLT 170    Recent Labs  10/25/14 0522  NA 138  K 4.2  CL 108  CO2 27  BUN 8  CREATININE 0.55  GLUCOSE 130*  CALCIUM 8.1*    Recent Labs  10/25/14 0522  INR 1.29    EXAM General - Patient is Alert, Appropriate and Oriented Extremity - Neurologically intact Neurovascular intact No cellulitis present Compartment soft Dressing - dressing C/D/I Motor Function - intact, moving foot and toes well on exam.  Hemovacs pulled without difficulty.  Past Medical History  Diagnosis Date  . H/O varicella   . Yeast infection   . Menses, irregular 07/02/10  . Menopausal symptoms 10/14/10  . Vulvar itching 04/14/11  . Hydradenitis 04/14/11  . Complication of anesthesia     Nausea  & vomiting during eye surgery  . PONV (postoperative nausea and vomiting)   . Family history of adverse reaction to anesthesia     sister has problems with nausea and vomiting   . Hypertension     hx of hypertension  no longer on meds   . Asthma     greater than 20 years ago   . Anxiety   . Depression   . GERD (gastroesophageal reflux disease)   . Arthritis   . Cancer     hx of skin cancer on back   . DVT (deep venous thrombosis)     12/2011     Assessment/Plan: 1 Day Post-Op Procedure(s) (LRB): TOTAL KNEE BILATERAL (Bilateral) Principal Problem:   OA (osteoarthritis) of knee   Advance diet Up with therapy  [facility] inpatient rehab consult  DVT Prophylaxis - Coumadin Weight-Bearing as tolerated to bilateral leg Keep foley until tomorrow.  [name] 10/25/2014, 6:26 AM

## 2014-10-25 NOTE — Progress Notes (Signed)
Rehab Admissions Coordinator Note:  Patient was screened by Retta Diones for appropriateness for an Inpatient Acute Rehab Consult.  At this time, an inpatient rehab consult has been ordered and is pending completion.  I will follow up once consult is completed.  Retta Diones 10/25/2014, 1:45 PM  I can be reached at [phone].

## 2014-10-25 NOTE — Progress Notes (Signed)
   10/25/14 1400  PT Visit Information  Last PT Received On 10/25/14  Assistance Needed +2  History of Present Illness s/p bil TKA; PMHx:  HTN, obesity  PT Time Calculation  PT Start Time (ACUTE ONLY) 1354  PT Stop Time (ACUTE ONLY) 1420  PT Time Calculation (min) (ACUTE ONLY) 26 min  Subjective Data  Patient Stated Goal to go to CIR  Precautions  Precautions Knee  Required Braces or Orthoses Knee Immobilizer - Right;Knee Immobilizer - Left  Knee Immobilizer - Right Discontinue once straight leg raise with < 10 degree lag  Knee Immobilizer - Left Discontinue once straight leg raise with < 10 degree lag  Restrictions  Other Position/Activity Restrictions WBAT  Pain Assessment  Pain Assessment 0-10  Pain Location R knee 6/10; L knee 8/10  Pain Descriptors / Indicators Burning;Sore  Pain Intervention(s) Limited activity within patient's tolerance;Monitored during session;Other (comment);Repositioned;Ice applied (epidural, meds due at 300 and rn aware)  Cognition  Arousal/Alertness Awake/alert  Behavior During Therapy WFL for tasks assessed/performed  Overall Cognitive Status Within Functional Limits for tasks assessed  Bed Mobility  Overal bed mobility Needs Assistance;+ 2 for safety/equipment  Bed Mobility Sit to Supine  Sit to supine Mod assist;+2 for safety/equipment  General bed mobility comments cues for technique, use of lower rail on pt R side, assist with bil LEs  Transfers  Overall transfer level Needs assistance  Equipment used Rolling walker (2 wheeled)  Transfers Sit to/from Stand  Sit to Stand +2 physical assistance;Mod assist;+2 safety/equipment  General transfer comment multi-modal cues for technique, +2 for safety and to wt shift to stand  Ambulation/Gait  Ambulation/Gait assistance Min assist;+2 physical assistance;+2 safety/equipment  Ambulation Distance (Feet) 7 Feet  Assistive device Rolling walker (2 wheeled)  Gait Pattern/deviations Step-to pattern;Wide  base of support;Decreased step length - right;Decreased step length - left  General Gait Details cues for RW position, breathing, safety and wt shift  Total Joint Exercises  Ankle Circles/Pumps AROM;Both;10 reps  Straight Leg Raises AAROM;Strengthening;Both;10 reps  Goniometric ROM L ~ -10 to 40*; R ~ -10 to 55*  Heel Slides AAROM;Both;10 reps  PT - End of Session  Equipment Utilized During Treatment Right knee immobilizer;Left knee immobilizer;Gait belt  Activity Tolerance Patient limited by pain;Patient limited by fatigue  Patient left in bed;with call bell/phone within reach;with family/visitor present  Nurse Communication Mobility status  PT - Assessment/Plan  PT Plan Current plan remains appropriate  PT Frequency (ACUTE ONLY) 7X/week  Follow Up Recommendations CIR  PT equipment Rolling walker with 5" wheels  PT Goal Progression  Progress towards PT goals Progressing toward goals  Acute Rehab PT Goals  PT Goal Formulation With patient  Time For Goal Achievement 11/02/14  Potential to Achieve Goals Good  PT General Charges  $$ ACUTE PT VISIT 1 Procedure  PT Treatments  $Gait Training 8-22 mins  $Therapeutic Exercise 8-22 mins

## 2014-10-25 NOTE — Discharge Instructions (Addendum)
Dr. Gaynelle Arabian Total Joint Specialist [facility] [address] [place], [place] [address] [phone]  TOTAL KNEE REPLACEMENT POSTOPERATIVE DIRECTIONS  Knee Rehabilitation, Guidelines Following Surgery  Results after knee surgery are often greatly improved when you follow the exercise, range of motion and muscle strengthening exercises prescribed by your doctor. Safety measures are also important to protect the knee from further injury. Any time any of these exercises cause you to have increased pain or swelling in your knee joint, decrease the amount until you are comfortable again and slowly increase them. If you have problems or questions, call your caregiver or physical therapist for advice.   HOME CARE INSTRUCTIONS  Remove items at home which could result in a fall. This includes throw rugs or furniture in walking pathways.   ICE to the affected knee every three hours for 30 minutes at a time and then as needed for pain and swelling.  Continue to use ice on the knee for pain and swelling from surgery. You may notice swelling that will progress down to the foot and ankle.  This is normal after surgery.  Elevate the leg when you are not up walking on it.    Continue to use the breathing machine which will help keep your temperature down.  It is common for your temperature to cycle up and down following surgery, especially at night when you are not up moving around and exerting yourself.  The breathing machine keeps your lungs expanded and your temperature down.  Do not place pillow under knee, focus on keeping the knee straight while resting  DIET You may resume your previous home diet once your are discharged from the hospital.  DRESSING / WOUND CARE / SHOWERING You may change your dressing 3-5 days after surgery.  Then change the dressing every day with sterile gauze.  Please use good hand washing techniques before changing the dressing.  Do not use any  lotions or creams on the incision until instructed by your surgeon. You may start showering once you are discharged home but do not submerge the incision under water. Just pat the incision dry and apply a dry gauze dressing on daily. Change the surgical dressing daily and reapply a dry dressing each time.  ACTIVITY Walk with your walker as instructed. Use walker as long as suggested by your caregivers. Avoid periods of inactivity such as sitting longer than an hour when not asleep. This helps prevent blood clots.  You may resume a sexual relationship in one month or when given the OK by your doctor.  You may return to work once you are cleared by your doctor.  Do not drive a car for 6 weeks or until released by you surgeon.  Do not drive while taking narcotics.  WEIGHT BEARING Weight bearing as tolerated with assist device (walker, cane, etc) as directed, use it as long as suggested by your surgeon or therapist, typically at least 4-6 weeks.  POSTOPERATIVE CONSTIPATION PROTOCOL Constipation - defined medically as fewer than three stools per week and severe constipation as less than one stool per week.  One of the most common issues patients have following surgery is constipation.  Even if you have a regular bowel pattern at home, your normal regimen is likely to be disrupted due to multiple reasons following surgery.  Combination of anesthesia, postoperative narcotics, change in appetite and fluid intake all can affect your bowels.  In order to avoid complications following surgery, here are some recommendations  in order to help you during your recovery period.  Colace (docusate) - Pick up an over-the-counter form of Colace or another stool softener and take twice a day as long as you are requiring postoperative pain medications.  Take with a full glass of water daily.  If you experience loose stools or diarrhea, hold the colace until you stool forms back up.  If your symptoms do not get better  within 1 week or if they get worse, check with your doctor.  Dulcolax (bisacodyl) - Pick up over-the-counter and take as directed by the product packaging as needed to assist with the movement of your bowels.  Take with a full glass of water.  Use this product as needed if not relieved by Colace only.   MiraLax (polyethylene glycol) - Pick up over-the-counter to have on hand.  MiraLax is a solution that will increase the amount of water in your bowels to assist with bowel movements.  Take as directed and can mix with a glass of water, juice, soda, coffee, or tea.  Take if you go more than two days without a movement. Do not use MiraLax more than once per day. Call your doctor if you are still constipated or irregular after using this medication for 7 days in a row.  If you continue to have problems with postoperative constipation, please contact the office for further assistance and recommendations.  If you experience "the worst abdominal pain ever" or develop nausea or vomiting, please contact the office immediatly for further recommendations for treatment.  ITCHING  If you experience itching with your medications, try taking only a single pain pill, or even half a pain pill at a time.  You can also use Benadryl over the counter for itching or also to help with sleep.   TED HOSE STOCKINGS Wear the elastic stockings on both legs for three weeks following surgery during the day but you may remove then at night for sleeping.  MEDICATIONS See your medication summary on the After Visit Summary that the nursing staff will review with you prior to discharge.  You may have some home medications which will be placed on hold until you complete the course of blood thinner medication.  It is important for you to complete the blood thinner medication as prescribed by your surgeon.  Continue your approved medications as instructed at time of discharge.  PRECAUTIONS If you experience chest pain or shortness  of breath - call 911 immediately for transfer to the hospital emergency department.  If you develop a fever greater that 101 F, purulent drainage from wound, increased redness or drainage from wound, foul odor from the wound/dressing, or calf pain - CONTACT YOUR SURGEON.                                                   FOLLOW-UP APPOINTMENTS Make sure you keep all of your appointments after your operation with your surgeon and caregivers. You should call the office at the above phone number and make an appointment for approximately two weeks after the date of your surgery or on the date instructed by your surgeon outlined in the "After Visit Summary".   RANGE OF MOTION AND STRENGTHENING EXERCISES  Rehabilitation of the knee is important following a knee injury or an operation. After just a few days of immobilization, the muscles  of the thigh which control the knee become weakened and shrink (atrophy). Knee exercises are designed to build up the tone and strength of the thigh muscles and to improve knee motion. Often times heat used for twenty to thirty minutes before working out will loosen up your tissues and help with improving the range of motion but do not use heat for the first two weeks following surgery. These exercises can be done on a training (exercise) mat, on the floor, on a table or on a bed. Use what ever works the best and is most comfortable for you Knee exercises include:  Leg Lifts - While your knee is still immobilized in a splint or cast, you can do straight leg raises. Lift the leg to 60 degrees, hold for 3 sec, and slowly lower the leg. Repeat 10-20 times 2-3 times daily. Perform this exercise against resistance later as your knee gets better.  Quad and Hamstring Sets - Tighten up the muscle on the front of the thigh (Quad) and hold for 5-10 sec. Repeat this 10-20 times hourly. Hamstring sets are done by pushing the foot backward against an object and holding for 5-10 sec. Repeat as  with quad sets.   Leg Slides: Lying on your back, slowly slide your foot toward your buttocks, bending your knee up off the floor (only go as far as is comfortable). Then slowly slide your foot back down until your leg is flat on the floor again.  Angel Wings: Lying on your back spread your legs to the side as far apart as you can without causing discomfort.  A rehabilitation program following serious knee injuries can speed recovery and prevent re-injury in the future due to weakened muscles. Contact your doctor or a physical therapist for more information on knee rehabilitation.   IF YOU ARE TRANSFERRED TO A SKILLED REHAB FACILITY If the patient is transferred to a skilled rehab facility following release from the hospital, a list of the current medications will be sent to the facility for the patient to continue.  When discharged from the skilled rehab facility, please have the facility set up the patient's [place] prior to being released. Also, the skilled facility will be responsible for providing the patient with their medications at time of release from the facility to include their pain medication, the muscle relaxants, and their blood thinner medication. If the patient is still at the rehab facility at time of the two week follow up appointment, the skilled rehab facility will also need to assist the patient in arranging follow up appointment in our office and any transportation needs.  MAKE SURE YOU:  Understand these instructions.  Get help right away if you are not doing well or get worse.    Pick up stool softner and laxative for home use following surgery while on pain medications. Do not submerge incision under water. Please use good hand washing techniques while changing dressing each day. May shower starting three days after surgery. Please use a clean towel to pat the incision dry following showers. Continue to use ice for pain and swelling after  surgery. Do not use any lotions or creams on the incision until instructed by your surgeon.  Take Coumadin for four weeks and then discontinue.  The dose may need to be adjusted based upon the INR.  Please follow the INR and titrate Coumadin dose for a therapeutic range between 2.0 and 3.0 INR.  After completing the four weeks of Coumadin, the  patient may stop the Coumadin and then take an 81 mg Aspirin daily for three more weeks.   Information on my medicine - Coumadin   (Warfarin)  This medication education was reviewed with me or my healthcare representative as part of my discharge preparation.  The pharmacist that spoke with me during my hospital stay was:  WOFFORD, DREW A, RPH  Why was Coumadin prescribed for you? Coumadin was prescribed for you because you have a blood clot or a medical condition that can cause an increased risk of forming blood clots. Blood clots can cause serious health problems by blocking the flow of blood to the heart, lung, or brain. Coumadin can prevent harmful blood clots from forming. As a reminder your indication for Coumadin is:   Blood Clot Prevention After Orthopedic Surgery  What test will check on my response to Coumadin? While on Coumadin (warfarin) you will need to have an INR test regularly to ensure that your dose is keeping you in the desired range. The INR (international normalized ratio) number is calculated from the result of the laboratory test called prothrombin time (PT).  If an INR APPOINTMENT HAS NOT ALREADY BEEN MADE FOR YOU please schedule an appointment to have this lab work done by your health care provider within 7 days. Your INR goal is usually a number between:  2 to 3 or your provider may give you a more narrow range like 2-2.5.  Ask your health care provider during an office visit what your goal INR is.  What  do you need to  know  About  COUMADIN? Take Coumadin (warfarin) exactly as prescribed by your healthcare provider about the  same time each day.  DO NOT stop taking without talking to the doctor who prescribed the medication.  Stopping without other blood clot prevention medication to take the place of Coumadin may increase your risk of developing a new clot or stroke.  Get refills before you run out.  What do you do if you miss a dose? If you miss a dose, take it as soon as you remember on the same day then continue your regularly scheduled regimen the next day.  Do not take two doses of Coumadin at the same time.  Important Safety Information A possible side effect of Coumadin (Warfarin) is an increased risk of bleeding. You should call your healthcare provider right away if you experience any of the following: ? Bleeding from an injury or your nose that does not stop. ? Unusual colored urine (red or dark brown) or unusual colored stools (red or black). ? Unusual bruising for unknown reasons. ? A serious fall or if you hit your head (even if there is no bleeding).  Some foods or medicines interact with Coumadin (warfarin) and might alter your response to warfarin. To help avoid this: ? Eat a balanced diet, maintaining a consistent amount of Vitamin K. ? Notify your provider about major diet changes you plan to make. ? Avoid alcohol or limit your intake to 1 drink for women and 2 drinks for men per day. (1 drink is 5 oz. wine, 12 oz. beer, or 1.5 oz. liquor.)  Make sure that ANY health care provider who prescribes medication for you knows that you are taking Coumadin (warfarin).  Also make sure the healthcare provider who is monitoring your Coumadin knows when you have started a new medication including herbals and non-prescription products.  Coumadin (Warfarin)  Major Drug Interactions  Increased Warfarin Effect Decreased Warfarin Effect  Alcohol (large quantities) Antibiotics (esp. Septra/Bactrim, Flagyl, Cipro) Amiodarone (Cordarone) Aspirin (ASA) Cimetidine (Tagamet) Megestrol (Megace) NSAIDs (ibuprofen,  naproxen, etc.) Piroxicam (Feldene) Propafenone (Rythmol SR) Propranolol (Inderal) Isoniazid (INH) Posaconazole (Noxafil) Barbiturates (Phenobarbital) Carbamazepine (Tegretol) Chlordiazepoxide (Librium) Cholestyramine (Questran) Griseofulvin Oral Contraceptives Rifampin Sucralfate (Carafate) Vitamin K   Coumadin (Warfarin) Major Herbal Interactions  Increased Warfarin Effect Decreased Warfarin Effect  Garlic Ginseng Ginkgo biloba Coenzyme Q10 Green tea St. Johns wort    Coumadin (Warfarin) FOOD Interactions  Eat a consistent number of servings per week of foods HIGH in Vitamin K (1 serving =  cup)  Collards (cooked, or boiled & drained) Kale (cooked, or boiled & drained) Mustard greens (cooked, or boiled & drained) Parsley *serving size only =  cup Spinach (cooked, or boiled & drained) Swiss chard (cooked, or boiled & drained) Turnip greens (cooked, or boiled & drained)  Eat a consistent number of servings per week of foods MEDIUM-HIGH in Vitamin K (1 serving = 1 cup)  Asparagus (cooked, or boiled & drained) Broccoli (cooked, boiled & drained, or raw & chopped) Brussel sprouts (cooked, or boiled & drained) *serving size only =  cup Lettuce, raw (green leaf, endive, romaine) Spinach, raw Turnip greens, raw & chopped   These websites have more information on Coumadin (warfarin):  FailFactory.se; VeganReport.com.au;

## 2014-10-25 NOTE — Progress Notes (Addendum)
[facility] for warfarin Indication: VTE prophylaxis  Allergies  Allergen Reactions  . Doxycycline Nausea And Vomiting  . Dust Mite Extract     UNKNOWN  . Mold Extract [Trichophyton Mentagrophyte]     UNKNOWN  . Nsaids Other (See Comments)    GI UPSET   . Tetracyclines & Related Nausea And Vomiting    Patient Measurements: Height: 5\' 10"  (177.8 cm) Weight: 290 lb (131.543 kg) IBW/kg (Calculated) : 68.5  Vital Signs: Temp: 98.4 F (36.9 C) (04/14 0636) Temp Source: Oral (04/14 0636) BP: 109/62 mmHg (04/14 0636) Pulse Rate: 61 (04/14 0636)  Labs:  Recent Labs  10/25/14 0522  HGB 9.4*  HCT 29.1*  PLT 170  LABPROT 16.2*  INR 1.29  CREATININE 0.55    Estimated Creatinine Clearance: 123.1 mL/min (by C-G formula based on Cr of 0.55).   Medications:  Scheduled:  . acetaminophen  1,000 mg Oral 4 times per day  . buPROPion  450 mg Oral q morning - 10a  . busPIRone  15 mg Oral Daily  . dexamethasone  10 mg Intravenous Once  . docusate sodium  100 mg Oral BID  . loratadine  10 mg Oral Daily  . pantoprazole  40 mg Oral Daily  . warfarin  4 mg Oral Once  . warfarin   Does not apply Once  . Warfarin - Pharmacist Dosing Inpatient   Does not apply q1800   Infusions:  . dextrose 5 % and 0.9 % NaCl with KCl 20 mEq/L 75 mL/hr at 10/25/14 0554  . ropivacaine (PF) 2 mg/ml (0.2%) 10 mL/hr (10/25/14 7628)    Assessment: 54 yo female s/p bilateral TKA started on epidural post op. To start warfarin per pharmacy dosing 4/14 at 10am. Pre-op INR 1.13 and CBC stable  Today: 10/25/2014:  CBC w/ low Hgb (ABLA); Plt lower but wnl  INR low as expected prior to warfarin administration  No significant drug-drug interactions with warfarin  No bleeding complications per nursing  Eating 100% of meals  Epidural to remain thru at least 4/15  Goal of Therapy:  INR 2-3   Plan:   Reduced warfarin order for this AM to 4 mg per epidural  catheter policy (less than 5 mg with epidural)  To receive no additional warfarin until epidural catheter removed  Daily INR, CBC at least q72 hrs while on warfarin  Coumadin video, booklet ordered  Provide warfarin patient education prior to discharge  Reuel Boom, PharmD Pager: [phone] 10/25/2014, 9:07 AM

## 2014-10-26 LAB — BASIC METABOLIC PANEL
ANION GAP: 4 — AB (ref 5–15)
BUN: 7 mg/dL (ref 6–23)
CO2: 28 mmol/L (ref 19–32)
CREATININE: 0.55 mg/dL (ref 0.50–1.10)
Calcium: 8.1 mg/dL — ABNORMAL LOW (ref 8.4–10.5)
Chloride: 106 mmol/L (ref 96–112)
GFR calc non Af Amer: >90 mL/min (ref >90)
Glucose, Bld: 106 mg/dL — ABNORMAL HIGH (ref 70–99)
Potassium: 4 mmol/L (ref 3.5–5.1)
Sodium: 138 mmol/L (ref 135–145)

## 2014-10-26 LAB — CBC
HCT: 28.7 % — ABNORMAL LOW (ref 36.0–46.0)
Hemoglobin: 9.2 g/dL — ABNORMAL LOW (ref 12.0–15.0)
MCH: 28.5 pg (ref 26.0–34.0)
MCHC: 32.1 g/dL (ref 30.0–36.0)
MCV: 88.9 fL (ref 78.0–100.0)
Platelets: 186 10*3/uL (ref 150–400)
RBC: 3.23 MIL/uL — ABNORMAL LOW (ref 3.87–5.11)
RDW: 15.8 % — AB (ref 11.5–15.5)
WBC: 7.1 10*3/uL (ref 4.0–10.5)

## 2014-10-26 LAB — PROTIME-INR
INR: 1.53 — AB (ref 0.00–1.49)
PROTHROMBIN TIME: 18.5 s — AB (ref 11.6–15.2)

## 2014-10-26 MED ORDER — ENOXAPARIN SODIUM 30 MG/0.3ML ~~LOC~~ SOLN
30.0000 mg | Freq: Two times a day (BID) | SUBCUTANEOUS | Status: DC
  Administered 2014-10-27 – 2014-10-29 (×4): 30 mg via SUBCUTANEOUS
  Filled 2014-10-26 (×9): qty 0.3

## 2014-10-26 MED ORDER — WARFARIN SODIUM 4 MG PO TABS
4.0000 mg | ORAL_TABLET | Freq: Once | ORAL | Status: AC
  Administered 2014-10-26: 4 mg via ORAL
  Filled 2014-10-26: qty 1

## 2014-10-26 NOTE — Progress Notes (Signed)
Physical Therapy Treatment Patient Details Name: Kendra Haney MRN: [id number] DOB: 11-Jun-1961 Today's Date: 10/26/2014    History of Present Illness s/p bil TKA; PMHx:  HTN, obesity    PT Comments      Follow Up Recommendations  CIR     Equipment Recommendations  Rolling walker with 5" wheels    Recommendations for Other Services OT consult     Precautions / Restrictions Precautions Precautions: Knee Required Braces or Orthoses: Knee Immobilizer - Right;Knee Immobilizer - Left Knee Immobilizer - Right: Discontinue once straight leg raise with < 10 degree lag Knee Immobilizer - Left: Discontinue once straight leg raise with < 10 degree lag Restrictions Weight Bearing Restrictions: No Other Position/Activity Restrictions: WBAT    Mobility  Bed Mobility Overal bed mobility: Needs Assistance;+ 2 for safety/equipment Bed Mobility: Sit to Supine     Supine to sit: Mod assist;+2 for safety/equipment Sit to supine: Mod assist;+2 for safety/equipment   General bed mobility comments: Cues for sequence and use of UEs to self assist  Transfers Overall transfer level: Needs assistance Equipment used: Rolling walker (2 wheeled) Transfers: Sit to/from Stand Sit to Stand: +2 physical assistance;Mod assist;+2 safety/equipment         General transfer comment: Cues for LE management and use of UEs to self assist  Ambulation/Gait Ambulation/Gait assistance: +2 physical assistance;+2 safety/equipment;Min assist;Mod assist Ambulation Distance (Feet): 10 Feet Assistive device: Rolling walker (2 wheeled) Gait Pattern/deviations: Step-to pattern;Decreased step length - right;Decreased step length - left;Shuffle;Trunk flexed Gait velocity: decr   General Gait Details: cues for sequence, posture and position from [employer]            Wheelchair Mobility    Modified Rankin (Stroke Patients Only)       Balance                                     Cognition Arousal/Alertness: Awake/alert Behavior During Therapy: WFL for tasks assessed/performed Overall Cognitive Status: Within Functional Limits for tasks assessed                      Exercises      General Comments        Pertinent Vitals/Pain Pain Assessment: 0-10 Pain Score: 4  Pain Location: Bil knees Pain Descriptors / Indicators: Aching;Sore Pain Intervention(s): Premedicated before session;Monitored during session;Limited activity within patient's tolerance;Ice applied    Home Living                      Prior Function            PT Goals (current goals can now be found in the care plan section) Acute Rehab PT Goals Patient Stated Goal: to go to CIR PT Goal Formulation: With patient Time For Goal Achievement: 11/02/14 Potential to Achieve Goals: Good Progress towards PT goals: Progressing toward goals    Frequency  7X/week    PT Plan Current plan remains appropriate    Co-evaluation             End of Session Equipment Utilized During Treatment: Right knee immobilizer;Left knee immobilizer;Gait belt Activity Tolerance: Patient tolerated treatment well Patient left: in bed;with call bell/phone within reach     Time: 1540-1600 PT Time Calculation (min) (ACUTE ONLY): 20 min  Charges:  $Gait Training: 8-22 mins  G Codes:      Kendra Haney 10-Nov-2014, 4:53 PM

## 2014-10-26 NOTE — Evaluation (Addendum)
Occupational Therapy Evaluation Patient Details Name: Kendra Haney MRN: [id number] DOB: [birth year] Today's Date: 10/26/2014    History of Present Illness s/p bil TKA; PMHx:  HTN, obesity   Clinical Impression   This 54 year old female was admitted for the above surgeries.  She will benefit from skilled OT to increase safety and independence with adls.  Pt was independent with adls prior to admission.  She currently needs Ax2 for sit to stand and LB adls.  Pt was limited by dizziness and pain during evaluation, and she needs up to total A x 2 for LB dressing.  Pt is very motivated and will benefit from CIR for rehab.  Will follow in acute with mostly min A level goals     Follow Up Recommendations  CIR    Equipment Recommendations  3 in 1 bedside comode    Recommendations for Other Services       Precautions / Restrictions Precautions Precautions: Knee Required Braces or Orthoses: Knee Immobilizer - Right;Knee Immobilizer - Left Knee Immobilizer - Right: Discontinue once straight leg raise with < 10 degree lag Knee Immobilizer - Left: Discontinue once straight leg raise with < 10 degree lag Restrictions Weight Bearing Restrictions: No Other Position/Activity Restrictions: WBAT      Mobility Bed Mobility   Bed Mobility: Sit to Supine       Sit to supine: Mod assist;+2 for safety/equipment   General bed mobility comments: cues for technique, use of lower rail on pt R side, assist with bil LEs  Transfers   Equipment used: Rolling walker (2 wheeled) Transfers: Sit to/from Stand Sit to Stand: +2 physical assistance;Mod assist;+2 safety/equipment;From elevated surface         General transfer comment: cues for technique    Balance                                            ADL Overall ADL's : Needs assistance/impaired                                       General ADL Comments: Pt is able to perform UB adls with set up.  She  was dizzy at EOB and needs max A x 2 for LB bathing and total A x 2 for LB dressing due to dizziness.  Educated on AE and demonstrated, but pt did not use this session.  Pain increased with weight bearing:  returned to supine.  Epidural was removed this morning.     Vision     Perception     Praxis      Pertinent Vitals/Pain Pain Assessment: Faces Faces Pain Scale: Hurts whole lot (with weight bearing) Pain Location: bil LEs Pain Descriptors / Indicators: Aching Pain Intervention(s): Limited activity within patient's tolerance;Monitored during session;Premedicated before session;Repositioned     Hand Dominance     Extremity/Trunk Assessment Upper Extremity Assessment Upper Extremity Assessment: Overall WFL for tasks assessed           Communication Communication Communication: No difficulties   Cognition Arousal/Alertness: Awake/alert Behavior During Therapy: WFL for tasks assessed/performed                       General Comments       Exercises  Shoulder Instructions      Home Living Family/patient expects to be discharged to:: Inpatient rehab                                        Prior Functioning/Environment Level of Independence: Independent             OT Diagnosis: Acute pain;Generalized weakness   OT Problem List: Decreased strength;Decreased activity tolerance;Decreased knowledge of use of DME or AE;Decreased knowledge of precautions;Pain   OT Treatment/Interventions: Self-care/ADL training;DME and/or AE instruction;Patient/family education    OT Goals(Current goals can be found in the care plan section) Acute Rehab OT Goals Patient Stated Goal: to go to CIR OT Goal Formulation: With patient Time For Goal Achievement: 11/02/14 Potential to Achieve Goals: Good ADL Goals Pt Will Perform Grooming: with min guard assist;standing Pt Will Perform Lower Body Bathing: with min assist;with adaptive equipment;sit  to/from stand Pt Will Transfer to Toilet: with min assist;ambulating;bedside commode Pt Will Perform Toileting - Clothing Manipulation and hygiene: with min assist;sit to/from stand  OT Frequency: Min 2X/week   Barriers to D/C:            Co-evaluation PT/OT/SLP Co-Evaluation/Treatment: Yes Reason for Co-Treatment: For patient/therapist safety PT goals addressed during session: Mobility/safety with mobility OT goals addressed during session: ADL's and self-care      End of Session CPM Left Knee CPM Left Knee: Off CPM Right Knee CPM Right Knee: Off  Activity Tolerance: Patient limited by fatigue;Patient limited by pain Patient left: in bed;with call bell/phone within reach   Time: 6270-3500 OT Time Calculation (min): 30 min Charges:  OT General Charges $OT Visit: 1 Procedure OT Evaluation $Initial OT Evaluation Tier I: 1 Procedure G-Codes:    Chane Magner 11/17/2014, 11:12 AM   Lesle Chris, OTR/L [phone] Nov 17, 2014

## 2014-10-26 NOTE — Addendum Note (Signed)
Addendum  created 10/26/14 5953 by Franne Grip, MD   Modules edited: Clinical Notes   Clinical Notes:  File: [id number]

## 2014-10-26 NOTE — Progress Notes (Signed)
Kendra Haney for warfarin Indication: VTE prophylaxis  Allergies  Allergen Reactions  . Doxycycline Nausea And Vomiting  . Dust Mite Extract     UNKNOWN  . Mold Extract [Trichophyton Mentagrophyte]     UNKNOWN  . Nsaids Other (See Comments)    GI UPSET   . Tetracyclines & Related Nausea And Vomiting    Patient Measurements: Height: 5\' 10"  (177.8 cm) Weight: 290 lb (131.543 kg) IBW/kg (Calculated) : 68.5  Vital Signs: Temp: 98.5 F (36.9 C) (04/15 0632) Temp Source: Oral (04/15 0211) BP: 121/52 mmHg (04/15 1552) Pulse Rate: 74 (04/15 0632)  Labs:  Recent Labs  10/25/14 0522 10/26/14 0510  HGB 9.4* 9.2*  HCT 29.1* 28.7*  PLT 170 186  LABPROT 16.2* 18.5*  INR 1.29 1.53*  CREATININE 0.55 0.55    Estimated Creatinine Clearance: 123.1 mL/min (by C-G formula based on Cr of 0.55).   Medications:  Scheduled:  . buPROPion  450 mg Oral q morning - 10a  . busPIRone  15 mg Oral Daily  . docusate sodium  100 mg Oral BID  . loratadine  10 mg Oral Daily  . pantoprazole  40 mg Oral Daily  . warfarin   Does not apply Once  . Warfarin - Pharmacist Dosing Inpatient   Does not apply q1800   Infusions:  . dextrose 5 % and 0.9 % NaCl with KCl 20 mEq/L 75 mL/hr (10/25/14 1812)    Assessment: 54 yo female s/p bilateral TKA 4/13, on epidural infusion for pain control post op.  Per orders from ortho attending, to start warfarin per pharmacy dosing 4/14 at 10am. Pre-op INR WNL  Today: 10/26/2014:  INR rising and just above 1.5 after warfarin 4 mg x 1 yesterday.  Unlikely to reflect a significant degree of anticoagulation this early (likely reflects warfarin effects on factor VII rather than factors II, X)  Anesthesiology discontinued epidural catheter this AM before INR rises further  No bleeding reported  Hgb low as expected post-op, but stable.  Pltc WNL  Diet:  Regular  Drug interactions: None significant  Goal of Therapy:  INR  2-3   Plan:  1. Warfarin 4 mg PO x 1 today at 1800 2.  Anticipate prophylactic-dose Lovenox will be ordered by ortho, but to start later this evening - see note from anesthesiology. 3. Daily PT/INR while inpatient. 4. Follow clinical course.  Clayburn Pert, PharmD, BCPS Pager: [phone] 10/26/2014  7:55 AM

## 2014-10-26 NOTE — Progress Notes (Signed)
Called to pull epidural. Pain currently 2/10. Moving both legs. No back pain. INR 1.53. Back non-red, non-tender. Epidural pulled. Tip intact. Coumadin 4 mg yesterday. Epidural pulled before INR elevated more. Patient counseled to let RN know if any back pain. Discussed with Arlee Muslim. Lovenox will be held until later in the day.

## 2014-10-26 NOTE — Progress Notes (Signed)
Physical Therapy Treatment Patient Details Name: Kendra Haney MRN: [id number] DOB: 1961/06/26 Today's Date: 10/26/2014    History of Present Illness s/p bil TKA; PMHx:  HTN, obesity    PT Comments    Marked improvement in activity tolerance with decreased reports of nausea, dizziness and pain.  Follow Up Recommendations  CIR     Equipment Recommendations  Rolling walker with 5" wheels    Recommendations for Other Services OT consult     Precautions / Restrictions Precautions Precautions: Knee Required Braces or Orthoses: Knee Immobilizer - Right;Knee Immobilizer - Left Knee Immobilizer - Right: Discontinue once straight leg raise with < 10 degree lag Knee Immobilizer - Left: Discontinue once straight leg raise with < 10 degree lag Restrictions Weight Bearing Restrictions: No Other Position/Activity Restrictions: WBAT    Mobility  Bed Mobility Overal bed mobility: Needs Assistance;+ 2 for safety/equipment Bed Mobility: Supine to Sit     Supine to sit: Mod assist;+2 for safety/equipment     General bed mobility comments: Cues for sequence and use of UEs to self assist  Transfers Overall transfer level: Needs assistance Equipment used: Rolling walker (2 wheeled) Transfers: Sit to/from Stand Sit to Stand: +2 physical assistance;Mod assist;+2 safety/equipment;From elevated surface         General transfer comment: Cues for technique, assist to rise with bed  Ambulation/Gait Ambulation/Gait assistance: Min assist;Mod assist;+2 physical assistance;+2 safety/equipment Ambulation Distance (Feet): 12 Feet Assistive device: Rolling walker (2 wheeled) Gait Pattern/deviations: Step-to pattern;Decreased step length - right;Decreased step length - left;Shuffle;Trunk flexed Gait velocity: decr   General Gait Details: cues for sequence, posture and position from [employer]            Wheelchair Mobility    Modified Rankin (Stroke Patients Only)        Balance                                    Cognition Arousal/Alertness: Awake/alert Behavior During Therapy: WFL for tasks assessed/performed Overall Cognitive Status: Within Functional Limits for tasks assessed                      Exercises      General Comments        Pertinent Vitals/Pain Pain Assessment: 0-10 Pain Score: 4  Pain Location: Bil knees Pain Descriptors / Indicators: Aching;Sore Pain Intervention(s): Limited activity within patient's tolerance;Monitored during session;Premedicated before session;Ice applied    Home Living                      Prior Function            PT Goals (current goals can now be found in the care plan section) Acute Rehab PT Goals Patient Stated Goal: to go to CIR PT Goal Formulation: With patient Time For Goal Achievement: 11/02/14 Potential to Achieve Goals: Good Progress towards PT goals: Progressing toward goals    Frequency  7X/week    PT Plan Current plan remains appropriate    Co-evaluation             End of Session Equipment Utilized During Treatment: Right knee immobilizer;Left knee immobilizer;Gait belt Activity Tolerance: Patient tolerated treatment well Patient left: in chair;with call bell/phone within reach     Time: 4967-5916 PT Time Calculation (min) (ACUTE ONLY): 19 min  Charges:  $Gait Training: 8-22 mins  G Codes:      Jassiel Flye 11-15-14, 2:33 PM

## 2014-10-26 NOTE — Progress Notes (Signed)
Rehab admissions - Evaluated for possible admission.  I met with patient and explained inpatient rehab.  I gave her rehab booklets and answered questions.  Patient has [employer].  I will begin the precert process.  I will update all once I hear back from insurance carrier.  Call me for questions.  #[phone]

## 2014-10-26 NOTE — Care Management Note (Signed)
    Page 1 of 1   10/26/2014     11:57:23 AM CARE MANAGEMENT NOTE 10/26/2014  Patient:  Kendra Haney, Kendra Haney   Account Number:  [id number]  Date Initiated:  10/25/2014  Documentation initiated by:  Sunday Spillers  Subjective/Objective Assessment:   54 yo female admitted s/p Bilateral  Total Knee Arthroplasty.     Action/Plan:   Discharge planning   Anticipated DC Date:  10/26/2014   Anticipated DC Plan:  IP REHAB FACILITY      DC Planning Services  CM consult      Choice offered to / List presented to:             Status of service:  Completed, signed off Medicare Important Message given?   (If response is "NO", the following Medicare IM given date fields will be blank) Date Medicare IM given:   Medicare IM given by:   Date Additional Medicare IM given:   Additional Medicare IM given by:    Discharge Disposition:  IP REHAB FACILITY  Per UR Regulation:  Reviewed for med. necessity/level of care/duration of stay  If discussed at Long Length of Stay Meetings, dates discussed:    Comments:  D/c to CIR pending insurance approval.

## 2014-10-26 NOTE — Progress Notes (Signed)
Subjective: 2 Days Post-Op Procedure(s) (LRB): TOTAL KNEE BILATERAL (Bilateral) Patient reports pain as moderate.   Patient seen in rounds with Dr. Wynelle Link.  Had a tough day yesterday but did better after changing the IV push medication. Patient is well, but has had some minor complaints of pain in the knees, requiring pain medications Epidural came out today this morning by Anesthesia.  Anticipate possible increase in pain temporarily since the epidural has been removed.  Encouraged to take PO pain meds. Plan is to go [facility] after hospital stay.  Looking into insurance coverage.  Probable transfer over Monday if approved.    Objective: Vital signs in last 24 hours: Temp:  [98.3 F (36.8 C)-98.5 F (36.9 C)] 98.5 F (36.9 C) (04/15 6195) Pulse Rate:  [61-74] 74 (04/15 0632) Resp:  [14-16] 16 (04/15 0632) BP: (102-138)/(48-62) 121/52 mmHg (04/15 0632) SpO2:  [98 %-100 %] 98 % (04/15 0632)  Intake/Output from previous day:  Intake/Output Summary (Last 24 hours) at 10/26/14 0837 Last data filed at 10/26/14 0932  Gross per 24 hour  Intake   1750 ml  Output   3750 ml  Net  -2000 ml    Intake/Output this shift: UOP about 1350 since around MN  Labs:  Recent Labs  10/25/14 0522 10/26/14 0510  HGB 9.4* 9.2*    Recent Labs  10/25/14 0522 10/26/14 0510  WBC 7.4 7.1  RBC 3.35* 3.23*  HCT 29.1* 28.7*  PLT 170 186    Recent Labs  10/25/14 0522 10/26/14 0510  NA 138 138  K 4.2 4.0  CL 108 106  BUN 8 7  CREATININE 0.55 0.55  GLUCOSE 130* 106*  CALCIUM 8.1* 8.1*    Recent Labs  10/25/14 0522 10/26/14 0510  INR 1.29 1.53*    EXAM General - Patient is Alert, Appropriate and Oriented Extremity - Neurovascular intact Sensation intact distally Dorsiflexion/Plantar flexion intact No cellulitis present Dressing/Incision - clean, dry, no drainage, healing to both the left and right knees Motor Function - intact, moving feet and toes well on exam.     Past Medical History  Diagnosis Date  . H/O varicella   . Yeast infection   . Menses, irregular 07/02/10  . Menopausal symptoms 10/14/10  . Vulvar itching 04/14/11  . Hydradenitis 04/14/11  . Complication of anesthesia     Nausea  & vomiting during eye surgery  . PONV (postoperative nausea and vomiting)   . Family history of adverse reaction to anesthesia     sister has problems with nausea and vomiting   . Hypertension     hx of hypertension no longer on meds   . Asthma     greater than 20 years ago   . Anxiety   . Depression   . GERD (gastroesophageal reflux disease)   . Arthritis   . Cancer     hx of skin cancer on back   . DVT (deep venous thrombosis)     12/2011     Assessment/Plan: 2 Days Post-Op Procedure(s) (LRB): TOTAL KNEE BILATERAL (Bilateral) Principal Problem:   OA (osteoarthritis) of knee  Estimated body mass index is 41.61 kg/(m^2) as calculated from the following:   Height as of this encounter: 5\' 10"  (1.778 m).   Weight as of this encounter: 131.543 kg (290 lb). Up with therapy Continue foley for now.  Will remove the catheter later this afternoon.  Anesthesia pulled epidural earlier this morning.  DVT Prophylaxis - Lovenox and Coumadin, Lovenox will  not start until tomorrow morning.   Anticipate possible increase in pain temporarily since the epidural has been removed.  Weight-Bearing as tolerated to both legs  Take Coumadin for four weeks and then discontinue.  The dose may need to be adjusted based upon the INR.  Please follow the INR and titrate Coumadin dose for a therapeutic range between 2.0 and 3.0 INR.  After completing the four weeks of Coumadin, the patient may stop the Coumadin and then start an 81 mg Aspirin daily.  Lovenox injections will start until tomorrow morning and continue until the INR is therapeutic at or greater than 2.0.  When INR reaches the therapeutic level of equal to or greater than 2.0, the patient may discontinue the  Lovenox injections.  Arlee Muslim, PA-C Orthopaedic Surgery 10/26/2014, 8:37 AM

## 2014-10-26 NOTE — Progress Notes (Signed)
Physical Therapy Treatment Patient Details Name: Kendra Haney MRN: [id number] DOB: 1960/08/17 Today's Date: 10/26/2014    History of Present Illness s/p bil TKA; PMHx:  HTN, obesity    PT Comments    Pt motivated but ltd this am by increasing nausea, pain and dizziness.  Follow Up Recommendations  CIR     Equipment Recommendations  Rolling walker with 5" wheels    Recommendations for Other Services OT consult     Precautions / Restrictions Precautions Precautions: Knee Required Braces or Orthoses: Knee Immobilizer - Right;Knee Immobilizer - Left Knee Immobilizer - Right: Discontinue once straight leg raise with < 10 degree lag Knee Immobilizer - Left: Discontinue once straight leg raise with < 10 degree lag Restrictions Weight Bearing Restrictions: No Other Position/Activity Restrictions: WBAT    Mobility  Bed Mobility Overal bed mobility: Needs Assistance;+ 2 for safety/equipment Bed Mobility: Supine to Sit;Sit to Supine     Supine to sit: Mod assist;+2 for safety/equipment Sit to supine: Mod assist;+2 for safety/equipment   General bed mobility comments: cues for technique, use of lower rail on pt R side, assist with bil LEs  Transfers Overall transfer level: Needs assistance Equipment used: Rolling walker (2 wheeled) Transfers: Sit to/from Stand Sit to Stand: +2 physical assistance;Mod assist;+2 safety/equipment;From elevated surface         General transfer comment: Cues for technique, assist to rise with bed  Ambulation/Gait             General Gait Details: Pt stood only.  Ambulation not attempted 2* pt c/o increasing pain, nausea and dizziness   Stairs            Wheelchair Mobility    Modified Rankin (Stroke Patients Only)       Balance                                    Cognition Arousal/Alertness: Awake/alert Behavior During Therapy: WFL for tasks assessed/performed Overall Cognitive Status: Within  Functional Limits for tasks assessed                      Exercises Total Joint Exercises Ankle Circles/Pumps: AROM;Both;10 reps Quad Sets: Both;AROM;10 reps;Supine Heel Slides: AAROM;Both;10 reps Straight Leg Raises: AAROM;Strengthening;Both;10 reps Goniometric ROM: AAROM at L knee ~ -10- 40; R knee ~ -10 -25 pain limited    General Comments        Pertinent Vitals/Pain Pain Assessment: 0-10 Faces Pain Scale: Hurts whole lot Pain Location: Bil LEs Pain Descriptors / Indicators: Aching;Sore Pain Intervention(s): Limited activity within patient's tolerance;Monitored during session;Premedicated before session;RN gave pain meds during session;Ice applied    Home Living                      Prior Function            PT Goals (current goals can now be found in the care plan section) Acute Rehab PT Goals Patient Stated Goal: to go to CIR PT Goal Formulation: With patient Time For Goal Achievement: 11/02/14 Potential to Achieve Goals: Good Progress towards PT goals: Progressing toward goals    Frequency  7X/week    PT Plan Current plan remains appropriate    Co-evaluation PT/OT/SLP Co-Evaluation/Treatment: Yes Reason for Co-Treatment: For patient/therapist safety PT goals addressed during session: Mobility/safety with mobility OT goals addressed during session: ADL's and self-care     End  of Session Equipment Utilized During Treatment: Right knee immobilizer;Left knee immobilizer;Gait belt Activity Tolerance: Patient limited by pain;Patient limited by fatigue Patient left: in bed;with call bell/phone within reach     Time: 0935-1018 PT Time Calculation (min) (ACUTE ONLY): 43 min  Charges:  $Therapeutic Exercise: 8-22 mins $Therapeutic Activity: 8-22 mins                    G Codes:      Remedy Corporan 11-20-14, 2:03 PM

## 2014-10-27 LAB — PROTIME-INR
INR: 1.47 (ref 0.00–1.49)
Prothrombin Time: 17.9 seconds — ABNORMAL HIGH (ref 11.6–15.2)

## 2014-10-27 LAB — CBC
HEMATOCRIT: 29.3 % — AB (ref 36.0–46.0)
HEMOGLOBIN: 9.2 g/dL — AB (ref 12.0–15.0)
MCH: 27.8 pg (ref 26.0–34.0)
MCHC: 31.4 g/dL (ref 30.0–36.0)
MCV: 88.5 fL (ref 78.0–100.0)
Platelets: 203 10*3/uL (ref 150–400)
RBC: 3.31 MIL/uL — AB (ref 3.87–5.11)
RDW: 15.7 % — ABNORMAL HIGH (ref 11.5–15.5)
WBC: 7 10*3/uL (ref 4.0–10.5)

## 2014-10-27 MED ORDER — WARFARIN SODIUM 6 MG PO TABS
6.0000 mg | ORAL_TABLET | Freq: Once | ORAL | Status: AC
  Administered 2014-10-27: 6 mg via ORAL
  Filled 2014-10-27: qty 1

## 2014-10-27 NOTE — Progress Notes (Signed)
   Subjective: 3 Days Post-Op Procedure(s) (LRB): TOTAL KNEE BILATERAL (Bilateral)  Pt doing fairly well Mild to moderate pain at times but tolerable Plan for rehab on Monday Patient reports pain as moderate.  Objective:   VITALS:   Filed Vitals:   10/27/14 0700  BP: 112/63  Pulse: 101  Temp: 98.9 F (37.2 C)  Resp: 18    Bilateral knee incisions healing well nv intact distally No rashes or erythema  LABS  Recent Labs  10/25/14 0522 10/26/14 0510 10/27/14 0459  HGB 9.4* 9.2* 9.2*  HCT 29.1* 28.7* 29.3*  WBC 7.4 7.1 7.0  PLT 170 186 203     Recent Labs  10/25/14 0522 10/26/14 0510  NA 138 138  K 4.2 4.0  BUN 8 7  CREATININE 0.55 0.55  GLUCOSE 130* 106*     Assessment/Plan: 3 Days Post-Op Procedure(s) (LRB): TOTAL KNEE BILATERAL (Bilateral) Overall pt doing well s/p bilateral total knees Continue PT/OT Plan for rehab on Monday Pain control as needed Pulmonary toilet    Merla Riches, MPAS, PA-C  10/27/2014, 7:54 AM

## 2014-10-27 NOTE — Progress Notes (Signed)
[facility] for warfarin Indication: VTE prophylaxis  Allergies  Allergen Reactions  . Doxycycline Nausea And Vomiting  . Dust Mite Extract     UNKNOWN  . Mold Extract [Trichophyton Mentagrophyte]     UNKNOWN  . Nsaids Other (See Comments)    GI UPSET   . Tetracyclines & Related Nausea And Vomiting    Patient Measurements: Height: 5\' 10"  (177.8 cm) Weight: 290 lb (131.543 kg) IBW/kg (Calculated) : 68.5  Vital Signs: Temp: 98.8 F (37.1 C) (04/16 0810) Temp Source: Oral (04/16 0810) BP: 130/71 mmHg (04/16 0810) Pulse Rate: 97 (04/16 0810)  Labs:  Recent Labs  10/25/14 0522 10/26/14 0510 10/27/14 0459  HGB 9.4* 9.2* 9.2*  HCT 29.1* 28.7* 29.3*  PLT 170 186 203  LABPROT 16.2* 18.5* 17.9*  INR 1.29 1.53* 1.47  CREATININE 0.55 0.55  --     Estimated Creatinine Clearance: 123.1 mL/min (by C-G formula based on Cr of 0.55).   Medications:  Scheduled:  . buPROPion  450 mg Oral q morning - 10a  . busPIRone  15 mg Oral Daily  . docusate sodium  100 mg Oral BID  . enoxaparin (LOVENOX) injection  30 mg Subcutaneous Q12H  . loratadine  10 mg Oral Daily  . pantoprazole  40 mg Oral Daily  . warfarin   Does not apply Once  . Warfarin - Pharmacist Dosing Inpatient   Does not apply q1800   Infusions:  . dextrose 5 % and 0.9 % NaCl with KCl 20 mEq/L Stopped (10/26/14 0846)    Assessment: 54 yo female s/p bilateral TKA 4/13, on epidural infusion for pain control post op.  Per orders from ortho attending, to start warfarin per pharmacy dosing 4/14 at 10am. Pre-op INR WNL  Today: 10/27/2014:  INR remains subtherapeutic after 2 doses of warfarin  Pt also on lovenox 30mg  SQ BID  Epidural catheter removed 4/15  No bleeding reported  Hgb low as expected post-op, but stable.  Pltc WNL  Diet:  Regular  Drug interactions: None significant  Goal of Therapy:  INR 2-3   Plan:  Warfarin 6 mg PO x 1 today at 1800 Daily PT/INR  while inpatient. Follow clinical course.  Ralene Bathe, PharmD, BCPS 10/27/2014, 11:50 AM  Pager: [phone]

## 2014-10-27 NOTE — Plan of Care (Signed)
Problem: Phase II Progression Outcomes Goal: Discharge plan established Outcome: Completed/Met Date Met:  10/27/14 CIR on Monday

## 2014-10-27 NOTE — Progress Notes (Signed)
Physical Therapy Treatment Patient Details Name: Jenille Laszlo MRN: [id number] DOB: 1960/09/17 Today's Date: 10/27/2014    History of Present Illness s/p bil TKA; PMHx:  HTN, obesity    PT Comments    Pt very motivated and progressing well with all mobility.  Follow Up Recommendations  CIR     Equipment Recommendations  Rolling walker with 5" wheels    Recommendations for Other Services OT consult     Precautions / Restrictions Precautions Precautions: Knee Required Braces or Orthoses: Knee Immobilizer - Right;Knee Immobilizer - Left Knee Immobilizer - Right: Discontinue once straight leg raise with < 10 degree lag Knee Immobilizer - Left: Discontinue once straight leg raise with < 10 degree lag Restrictions Weight Bearing Restrictions: No Other Position/Activity Restrictions: WBAT    Mobility  Bed Mobility Overal bed mobility: Needs Assistance Bed Mobility: Supine to Sit;Sit to Supine     Supine to sit: Min assist Sit to supine: Mod assist   General bed mobility comments: Cues for sequence and use of UEs to self assist; physical assist for Bil LEs  Transfers Overall transfer level: Needs assistance Equipment used: Rolling walker (2 wheeled) Transfers: Sit to/from Stand Sit to Stand: Min assist;Mod assist         General transfer comment: Cues for transition position, LE management and use of UEs to self assist.  Physical assist to bring wt up and fwd  Ambulation/Gait Ambulation/Gait assistance: Min assist Ambulation Distance (Feet): 20 Feet (twice) Assistive device: Rolling walker (2 wheeled) Gait Pattern/deviations: Step-to pattern;Decreased step length - right;Decreased step length - left;Shuffle;Trunk flexed Gait velocity: decr   General Gait Details: cues for sequence, posture and position from [employer]            Wheelchair Mobility    Modified Rankin (Stroke Patients Only)       Balance                                    Cognition Arousal/Alertness: Awake/alert Behavior During Therapy: WFL for tasks assessed/performed Overall Cognitive Status: Within Functional Limits for tasks assessed                      Exercises      General Comments        Pertinent Vitals/Pain Pain Assessment: 0-10 Pain Score: 4  Pain Location: Bil knees Pain Descriptors / Indicators: Aching;Sore Pain Intervention(s): Limited activity within patient's tolerance;Monitored during session;Premedicated before session    Home Living                      Prior Function            PT Goals (current goals can now be found in the care plan section) Acute Rehab PT Goals Patient Stated Goal: to go to CIR PT Goal Formulation: With patient Time For Goal Achievement: 11/02/14 Potential to Achieve Goals: Good Progress towards PT goals: Progressing toward goals    Frequency  7X/week    PT Plan Current plan remains appropriate    Co-evaluation             End of Session Equipment Utilized During Treatment: Right knee immobilizer;Gait belt Activity Tolerance: Patient tolerated treatment well Patient left: in bed;with call bell/phone within reach     Time: 1410-1434 PT Time Calculation (min) (ACUTE ONLY): 24 min  Charges:  $Gait Training: 23-37 mins  G Codes:      Shaquala Broeker 11/06/2014, 3:07 PM

## 2014-10-27 NOTE — Progress Notes (Signed)
Physical Therapy Treatment Patient Details Name: Kendra Haney MRN: [id number] DOB: 08/04/1960 Today's Date: 10/27/2014    History of Present Illness s/p bil TKA; PMHx:  HTN, obesity    PT Comments    Pt very motivated and progressing well with mobility including commode transfers.  Follow Up Recommendations  CIR     Equipment Recommendations  Rolling walker with 5" wheels    Recommendations for Other Services OT consult     Precautions / Restrictions Precautions Precautions: Knee Required Braces or Orthoses: Knee Immobilizer - Right;Knee Immobilizer - Left Knee Immobilizer - Right: Discontinue once straight leg raise with < 10 degree lag Knee Immobilizer - Left: Discontinue once straight leg raise with < 10 degree lag (Pt performed IND SLR this am) Restrictions Weight Bearing Restrictions: No Other Position/Activity Restrictions: WBAT    Mobility  Bed Mobility Overal bed mobility: Needs Assistance Bed Mobility: Supine to Sit     Supine to sit: Mod assist     General bed mobility comments: Cues for sequence and use of UEs to self assist  Transfers Overall transfer level: Needs assistance Equipment used: Rolling walker (2 wheeled) Transfers: Sit to/from Stand Sit to Stand: +2 physical assistance;From elevated surface;Min assist;Mod assist Stand pivot transfers: +2 physical assistance;+2 safety/equipment;Min assist;Mod assist       General transfer comment: Cues for LE management and use of UEs to self assist  Ambulation/Gait Ambulation/Gait assistance: Min assist;Mod assist;+2 safety/equipment Ambulation Distance (Feet): 48 Feet Assistive device: Rolling walker (2 wheeled) Gait Pattern/deviations: Step-to pattern;Decreased step length - left;Decreased step length - right;Shuffle;Trunk flexed Gait velocity: decr   General Gait Details: cues for sequence, posture and position from [employer]            Wheelchair Mobility    Modified Rankin  (Stroke Patients Only)       Balance                                    Cognition Arousal/Alertness: Awake/alert Behavior During Therapy: WFL for tasks assessed/performed Overall Cognitive Status: Within Functional Limits for tasks assessed                      Exercises Total Joint Exercises Ankle Circles/Pumps: AROM;Both;15 reps Quad Sets: Both;AROM;Supine;15 reps Heel Slides: AAROM;Both;20 reps;Supine Straight Leg Raises: Strengthening;Both;AAROM;AROM;15 reps;Supine Goniometric ROM: AAROM Bil knees ~ -10-  45    General Comments        Pertinent Vitals/Pain Pain Assessment: 0-10 Pain Score: 4  Pain Location: Bil knees Pain Descriptors / Indicators: Aching;Sore Pain Intervention(s): Limited activity within patient's tolerance;Monitored during session;Premedicated before session;Ice applied    Home Living                      Prior Function            PT Goals (current goals can now be found in the care plan section) Acute Rehab PT Goals Patient Stated Goal: to go to CIR PT Goal Formulation: With patient Time For Goal Achievement: 11/02/14 Potential to Achieve Goals: Good Progress towards PT goals: Progressing toward goals    Frequency  7X/week    PT Plan Current plan remains appropriate    Co-evaluation             End of Session Equipment Utilized During Treatment: Right knee immobilizer;Gait belt Activity Tolerance: Patient tolerated treatment well Patient  left: in chair;with call bell/phone within reach     Time: 0832-0913 PT Time Calculation (min) (ACUTE ONLY): 41 min  Charges:  $Gait Training: 8-22 mins $Therapeutic Exercise: 8-22 mins $Therapeutic Activity: 8-22 mins                    G Codes:      Kendra Haney 2014/11/07, 1:13 PM

## 2014-10-27 NOTE — Progress Notes (Signed)
Physical Therapy Treatment Patient Details Name: Kendra Haney MRN: [id number] DOB: [birth year] Today's Date: 10/27/2014    History of Present Illness s/p bil TKA; PMHx:  HTN, obesity    PT Comments    Pt motivated and progressing well with mobility including commode transfers  Follow Up Recommendations  CIR     Equipment Recommendations  Rolling walker with 5" wheels    Recommendations for Other Services OT consult     Precautions / Restrictions Precautions Precautions: Knee Required Braces or Orthoses: Knee Immobilizer - Right;Knee Immobilizer - Left Knee Immobilizer - Right: Discontinue once straight leg raise with < 10 degree lag Knee Immobilizer - Left: Discontinue once straight leg raise with < 10 degree lag Restrictions Weight Bearing Restrictions: No Other Position/Activity Restrictions: WBAT    Mobility  Bed Mobility Overal bed mobility: Needs Assistance Bed Mobility: Sit to Supine     Supine to sit: Mod assist Sit to supine: Mod assist   General bed mobility comments: Cues for sequence and use of UEs to self assist  Transfers Overall transfer level: Needs assistance Equipment used: Rolling walker (2 wheeled) Transfers: Sit to/from Stand Sit to Stand: +2 physical assistance;From elevated surface;Min assist;Mod assist Stand pivot transfers: +2 physical assistance;+2 safety/equipment;Min assist;Mod assist       General transfer comment: Cues for LE management and use of UEs to self assist  Ambulation/Gait Ambulation/Gait assistance: Min assist;Mod assist;+2 safety/equipment Ambulation Distance (Feet): 8 Feet Assistive device: Rolling walker (2 wheeled) Gait Pattern/deviations: Step-to pattern;Decreased step length - right;Decreased step length - left;Shuffle;Trunk flexed Gait velocity: decr   General Gait Details: cues for sequence, posture and position from [employer]            Wheelchair Mobility    Modified Rankin (Stroke Patients  Only)       Balance                                    Cognition Arousal/Alertness: Awake/alert Behavior During Therapy: WFL for tasks assessed/performed Overall Cognitive Status: Within Functional Limits for tasks assessed                      Exercises Total Joint Exercises Ankle Circles/Pumps: AROM;Both;15 reps Quad Sets: Both;AROM;Supine;15 reps Heel Slides: AAROM;Both;20 reps;Supine Straight Leg Raises: Strengthening;Both;AAROM;AROM;15 reps;Supine Goniometric ROM: AAROM Bil knees ~ -10-  45    General Comments        Pertinent Vitals/Pain Pain Assessment: 0-10 Pain Score: 5  Pain Location: Bil knees Pain Descriptors / Indicators: Aching;Sore Pain Intervention(s): Limited activity within patient's tolerance;Monitored during session;Premedicated before session;Ice applied    Home Living                      Prior Function            PT Goals (current goals can now be found in the care plan section) Acute Rehab PT Goals Patient Stated Goal: to go to CIR PT Goal Formulation: With patient Time For Goal Achievement: 11/02/14 Potential to Achieve Goals: Good Progress towards PT goals: Progressing toward goals    Frequency  7X/week    PT Plan Current plan remains appropriate    Co-evaluation             End of Session Equipment Utilized During Treatment: Right knee immobilizer;Gait belt Activity Tolerance: Patient tolerated treatment well Patient left: in bed;with call  bell/phone within reach     Time: 1040-1102 PT Time Calculation (min) (ACUTE ONLY): 22 min  Charges:  $Gait Training: 8-22 mins $Therapeutic Exercise: 8-22 mins $Therapeutic Activity: 8-22 mins                    G Codes:      Travis Purk 20-Nov-2014, 1:18 PM

## 2014-10-28 LAB — PROTIME-INR
INR: 2.26 — ABNORMAL HIGH (ref 0.00–1.49)
PROTHROMBIN TIME: 25.1 s — AB (ref 11.6–15.2)

## 2014-10-28 MED ORDER — WARFARIN SODIUM 2 MG PO TABS
2.0000 mg | ORAL_TABLET | Freq: Once | ORAL | Status: AC
  Administered 2014-10-28: 2 mg via ORAL
  Filled 2014-10-28: qty 1

## 2014-10-28 NOTE — Progress Notes (Signed)
Physical Therapy Treatment Patient Details Name: Kendra Haney MRN: [id number] DOB: 1961/03/07 Today's Date: 10/28/2014    History of Present Illness s/p bil TKA; PMHx:  HTN, obesity    PT Comments    POD # 4 am session.  Performed B LR TKR TE's while in bed.  Applied R KI and assisted OOB.  Amb in hallway. Pt progressing slowly and will need CIR prior to D/C to home.  Follow Up Recommendations  CIR     Equipment Recommendations  Rolling walker with 5" wheels    Recommendations for Other Services       Precautions / Restrictions Precautions Precautions: Knee Precaution Comments: pt aware of KI use for OOB amb Required Braces or Orthoses: Knee Immobilizer - Right Knee Immobilizer - Right: Discontinue once straight leg raise with < 10 degree lag Knee Immobilizer - Left:  (D/C L KI) Restrictions Weight Bearing Restrictions: No    Mobility  Bed Mobility Overal bed mobility: Needs Assistance Bed Mobility: Supine to Sit     Supine to sit: Min assist     General bed mobility comments: Cues for sequence and use of UEs to self assist; physical assist for Bil LEs  Transfers Overall transfer level: Needs assistance Equipment used: Rolling walker (2 wheeled) Transfers: Sit to/from Stand Sit to Stand: Min assist;Mod assist         General transfer comment: Cues for transition position, LE management and use of UEs to self assist.  Physical assist to bring wt up and fwd.  Elevated bed  Ambulation/Gait Ambulation/Gait assistance: Min assist Ambulation Distance (Feet): 25 Feet Assistive device: Rolling walker (2 wheeled) Gait Pattern/deviations: Step-to pattern;Wide base of support Gait velocity: decreased   General Gait Details: cues for sequence, posture and position from RW plus increased time   Stairs            Wheelchair Mobility    Modified Rankin (Stroke Patients Only)       Balance                                    Cognition  Arousal/Alertness: Awake/alert Behavior During Therapy: WFL for tasks assessed/performed Overall Cognitive Status: Within Functional Limits for tasks assessed                      Exercises   Total Knee Replacement TE's 10 reps B LE ankle pumps 10 reps towel squeezes 10 reps knee presses 10 reps heel slides  10 reps SAQ's 10 reps SLR's 10 reps ABD Followed by ICE     General Comments        Pertinent Vitals/Pain Pain Assessment: 0-10 Pain Score: 6  Pain Location: R>L Pain Descriptors / Indicators: Aching;Sore Pain Intervention(s): Repositioned;Premedicated before session;Patient requesting pain meds-RN notified;Ice applied    Home Living                      Prior Function            PT Goals (current goals can now be found in the care plan section) Progress towards PT goals: Progressing toward goals    Frequency  7X/week    PT Plan Current plan remains appropriate    Co-evaluation             End of Session Equipment Utilized During Treatment: Right knee immobilizer;Gait belt Activity Tolerance: Patient tolerated treatment well Patient  left: in chair;with call bell/phone within reach     Time: 1010-1048 PT Time Calculation (min) (ACUTE ONLY): 38 min  Charges:  $Gait Training: 8-22 mins $Therapeutic Exercise: 8-22 mins $Therapeutic Activity: 8-22 mins                    G Codes:      Rica Koyanagi  PTA [facility]  Acute  Rehab Pager      [phone]

## 2014-10-28 NOTE — Progress Notes (Signed)
[place] for warfarin Indication: VTE prophylaxis  Allergies  Allergen Reactions  . Doxycycline Nausea And Vomiting  . Dust Mite Extract     UNKNOWN  . Mold Extract [Trichophyton Mentagrophyte]     UNKNOWN  . Nsaids Other (See Comments)    GI UPSET   . Tetracyclines & Related Nausea And Vomiting    Patient Measurements: Height: 5\' 10"  (177.8 cm) Weight: 290 lb (131.543 kg) IBW/kg (Calculated) : 68.5  Vital Signs: Temp: 98.6 F (37 C) (04/17 0422) Temp Source: Oral (04/17 0422) BP: 128/58 mmHg (04/17 0422) Pulse Rate: 78 (04/17 0422)  Labs:  Recent Labs  10/26/14 0510 10/27/14 0459 10/28/14 0538  HGB 9.2* 9.2*  --   HCT 28.7* 29.3*  --   PLT 186 203  --   LABPROT 18.5* 17.9* 25.1*  INR 1.53* 1.47 2.26*  CREATININE 0.55  --   --     Estimated Creatinine Clearance: 123.1 mL/min (by C-G formula based on Cr of 0.55).   Medications:  Scheduled:  . buPROPion  450 mg Oral q morning - 10a  . busPIRone  15 mg Oral Daily  . docusate sodium  100 mg Oral BID  . enoxaparin (LOVENOX) injection  30 mg Subcutaneous Q12H  . loratadine  10 mg Oral Daily  . pantoprazole  40 mg Oral Daily  . warfarin   Does not apply Once  . Warfarin - Pharmacist Dosing Inpatient   Does not apply q1800   Infusions:     Assessment: 54 yo female s/p bilateral TKA 4/13, on epidural infusion for pain control post op.  Per orders from ortho attending, to start warfarin per pharmacy dosing 4/14 at 10am. Pre-op INR WNL  Today: 10/28/2014:  INR now therapeutic after 3 doses warfarin  Pt also on lovenox 30mg  SQ BID bridge  Epidural catheter removed 4/15  No bleeding reported  Hgb low as expected post-op, but stable.  Pltc WNL  Diet:  Regular  Drug interactions: None significant  Goal of Therapy:  INR 2-3   Plan:  Warfarin 2 mg PO x 1 today at 1800 Daily PT/INR while inpatient  Ralene Bathe, PharmD, BCPS 10/28/2014, 3:12 PM  Pager:  [phone]

## 2014-10-28 NOTE — Progress Notes (Signed)
   Subjective: 4 Days Post-Op Procedure(s) (LRB): TOTAL KNEE BILATERAL (Bilateral)  Pt looks good this morning Mild soreness in bilateral knees Therapy going well Patient reports pain as mild.  Objective:   VITALS:   Filed Vitals:   10/28/14 0422  BP: 128/58  Pulse: 78  Temp: 98.6 F (37 C)  Resp: 17    Bilateral knee incisions healing well nv intact distally No rashes or edema  LABS  Recent Labs  10/26/14 0510 10/27/14 0459  HGB 9.2* 9.2*  HCT 28.7* 29.3*  WBC 7.1 7.0  PLT 186 203     Recent Labs  10/26/14 0510  NA 138  K 4.0  BUN 7  CREATININE 0.55  GLUCOSE 106*     Assessment/Plan: 4 Days Post-Op Procedure(s) (LRB): TOTAL KNEE BILATERAL (Bilateral) Pt doing well Plan for CIR placement tomorrow Continue PT/OT    Merla Riches, MPAS, PA-C  10/28/2014, 7:44 AM

## 2014-10-28 NOTE — Progress Notes (Signed)
Physical Therapy Treatment Patient Details Name: Kendra Haney MRN: [id number] DOB: 01-Aug-1960 Today's Date: 10/28/2014    History of Present Illness s/p bil TKA; PMHx:  HTN, obesity    PT Comments    POD # 4 pm session.  Applied R LE KI and assisted OOB to amb to BR then a limited distances in hallway.  Returned to bed for CPM.  Increased c/o fatigue. Pt will need Rehab at CIR prior to D/C to home.  Follow Up Recommendations  CIR     Equipment Recommendations  Rolling walker with 5" wheels    Recommendations for Other Services       Precautions / Restrictions Precautions Precautions: Knee Precaution Comments: pt aware of KI use for OOB amb Required Braces or Orthoses: Knee Immobilizer - Right Knee Immobilizer - Right: Discontinue once straight leg raise with < 10 degree lag Knee Immobilizer - Left:  (D/C L KI) Restrictions Weight Bearing Restrictions: No    Mobility  Bed Mobility Overal bed mobility: Needs Assistance Bed Mobility: Supine to Sit/sit to supine     Supine to sit: Min assist     General bed mobility comments: Cues for sequence and use of UEs to self assist; physical assist for Bil LEs  Transfers Overall transfer level: Needs assistance Equipment used: Rolling walker (2 wheeled) Transfers: Sit to/from Stand Sit to Stand: Min assist;Mod assist         General transfer comment: Cues for transition position, LE management and use of UEs to self assist.  Physical assist to bring wt up and fwd.  Elevated bed  Ambulation/Gait Ambulation/Gait assistance: Min assist Ambulation Distance (Feet): 25 Feet Assistive device: Rolling walker (2 wheeled) Gait Pattern/deviations: Step-to pattern;Wide base of support Gait velocity: decreased   General Gait Details: cues for sequence, posture and position from RW plus increased time   Stairs            Wheelchair Mobility    Modified Rankin (Stroke Patients Only)       Balance                                     Cognition Arousal/Alertness: Awake/alert Behavior During Therapy: WFL for tasks assessed/performed Overall Cognitive Status: Within Functional Limits for tasks assessed                      Exercises      General Comments        Pertinent Vitals/Pain Pain Assessment: 0-10 Pain Score: 6  Pain Location: R>L Pain Descriptors / Indicators: Aching;Sore Pain Intervention(s): Repositioned;Premedicated before session;Patient requesting pain meds-RN notified;Ice applied    Home Living                      Prior Function            PT Goals (current goals can now be found in the care plan section) Progress towards PT goals: Progressing toward goals    Frequency  7X/week    PT Plan Current plan remains appropriate    Co-evaluation             End of Session Equipment Utilized During Treatment: Right knee immobilizer;Gait belt Activity Tolerance: Patient tolerated treatment well Patient left: in chair;with call bell/phone within reach     Time: 1017-5102 PT Time Calculation (min) (ACUTE ONLY): 25 min  Charges:  $Gait Training: 8-22  mins $Therapeutic Activity: 8-22 mins                    G Codes:      Rica Koyanagi  PTA [facility]  Acute  Rehab Pager      [phone]

## 2014-10-28 NOTE — Progress Notes (Signed)
Noted that coumadin dose had dropped to 2 mg for today from 6 mg given yesterday. Checked with Pharm D about giving tonight's lovenox & was told INR needed to be therapuetic x 48 hrs before lovenox would be d/c'd & that the meds could overlap x 5 days if needed. Paulino Cork, [employer]

## 2014-10-29 LAB — PROTIME-INR
INR: 2.2 — AB (ref 0.00–1.49)
Prothrombin Time: 24.6 seconds — ABNORMAL HIGH (ref 11.6–15.2)

## 2014-10-29 MED ORDER — METOCLOPRAMIDE HCL 5 MG PO TABS: 5.0000 mg | ORAL_TABLET | Freq: Three times a day (TID) | ORAL | Status: DC | PRN

## 2014-10-29 MED ORDER — METHOCARBAMOL 500 MG PO TABS: 500.0000 mg | ORAL_TABLET | Freq: Four times a day (QID) | ORAL | Status: DC | PRN

## 2014-10-29 MED ORDER — BISACODYL 10 MG RE SUPP: 10.0000 mg | Freq: Every day | RECTAL | Status: DC | PRN

## 2014-10-29 MED ORDER — WARFARIN SODIUM 2 MG PO TABS: 2.0000 mg | ORAL_TABLET | Freq: Every day | ORAL | Status: DC

## 2014-10-29 MED ORDER — ONDANSETRON HCL 4 MG PO TABS: 4.0000 mg | ORAL_TABLET | Freq: Four times a day (QID) | ORAL | Status: DC | PRN

## 2014-10-29 MED ORDER — WARFARIN SODIUM 2 MG PO TABS
2.0000 mg | ORAL_TABLET | Freq: Once | ORAL | Status: DC
  Filled 2014-10-29: qty 1

## 2014-10-29 MED ORDER — POLYETHYLENE GLYCOL 3350 17 G PO PACK: 17.0000 g | PACK | Freq: Every day | ORAL | Status: DC | PRN

## 2014-10-29 MED ORDER — OXYCODONE HCL 5 MG PO TABS: 5.0000 mg | ORAL_TABLET | ORAL | Status: DC | PRN

## 2014-10-29 MED ORDER — DOCUSATE SODIUM 100 MG PO CAPS: 100.0000 mg | ORAL_CAPSULE | Freq: Two times a day (BID) | ORAL | Status: DC

## 2014-10-29 NOTE — Progress Notes (Signed)
Clinical Social Work Department CLINICAL SOCIAL WORK PLACEMENT NOTE 10/29/2014  Patient:  Kendra Haney, Kendra Haney  Account Number:  [id number] Admit date:  10/24/2014  Clinical Social Worker:  Werner Lean, LCSW  Date/time:  10/29/2014 11:13 AM  Clinical Social Work is seeking post-discharge placement for this patient at the following level of care:   SKILLED NURSING   (*CSW will update this form in Epic as items are completed)   10/29/2014  Patient/family provided with [facility] Department of Clinical Social Work's list of facilities offering this level of care within the geographic area requested by the patient (or if unable, by the patient's family).  10/29/2014  Patient/family informed of their freedom to choose among providers that offer the needed level of care, that participate in Medicare, Medicaid or managed care program needed by the patient, have an available bed and are willing to accept the patient.    Patient/family informed of [facility]' ownership interest in [facility], as well as of the fact that they are under no obligation to receive care at this facility.  PASARR submitted to EDS on 10/26/2014 PASARR number received on 10/26/2014  FL2 transmitted to all facilities in geographic area requested by pt/family on  10/29/2014 FL2 transmitted to all facilities within larger geographic area on   Patient informed that his/her managed care company has contracts with or will negotiate with  certain facilities, including the following:     Patient/family informed of bed offers received:  10/29/2014 Patient chooses bed at [place] Physician recommends and patient chooses bed at    Patient to be transferred to [place] on  10/29/2014 Patient to be transferred to facility by [facility] Patient and family notified of transfer on 10/29/2014 Name of family member notified:  Pt contacted her sister directly.  The following physician request were entered in  Epic:   Additional Comments: Pt is in agreement with d/c plan for SNF today. [facility] transport is required. Pt is aware out of pocket costs may be associated with [facility] transport. NSG reviewed d/c summary, scripts, avs. Scripts included in d/c packet.  Werner Lean LCSW [phone]

## 2014-10-29 NOTE — Progress Notes (Signed)
[place] for warfarin Indication: VTE prophylaxis  Allergies  Allergen Reactions  . Doxycycline Nausea And Vomiting  . Dust Mite Extract     UNKNOWN  . Mold Extract [Trichophyton Mentagrophyte]     UNKNOWN  . Nsaids Other (See Comments)    GI UPSET   . Tetracyclines & Related Nausea And Vomiting    Patient Measurements: Height: 5\' 10"  (177.8 cm) Weight: 290 lb (131.543 kg) IBW/kg (Calculated) : 68.5  Vital Signs: Temp: 99 F (37.2 C) (04/18 0510) Temp Source: Oral (04/18 0510) BP: 133/66 mmHg (04/18 0510) Pulse Rate: 82 (04/18 0510)  Labs:  Recent Labs  10/27/14 0459 10/28/14 0538 10/29/14 0443  HGB 9.2*  --   --   HCT 29.3*  --   --   PLT 203  --   --   LABPROT 17.9* 25.1* 24.6*  INR 1.47 2.26* 2.20*    Estimated Creatinine Clearance: 123.1 mL/min (by C-G formula based on Cr of 0.55).   Medications:  Scheduled:  . buPROPion  450 mg Oral q morning - 10a  . busPIRone  15 mg Oral Daily  . docusate sodium  100 mg Oral BID  . enoxaparin (LOVENOX) injection  30 mg Subcutaneous Q12H  . loratadine  10 mg Oral Daily  . pantoprazole  40 mg Oral Daily  . warfarin   Does not apply Once  . Warfarin - Pharmacist Dosing Inpatient   Does not apply q1800   Infusions:     Assessment: 54 yo female s/p bilateral TKA 4/13, on epidural infusion for pain control post op.  Per orders from ortho attending, to start warfarin per pharmacy dosing 4/14 at 10am. Pre-op INR WNL  Today: 10/29/2014:  INR now therapeutic today at 2.20  Pt also on lovenox 30mg  SQ BID bridge  Epidural catheter removed 4/15  No bleeding reported  Hgb low as expected post-op, but stable.  Pltc WNL  Diet:  Regular  Drug interactions: None significant  Goal of Therapy:  INR 2-3   Plan:  - To d/c patient to CIR today with plan to treat with 4 weeks of coumadin - Agree with MD to continue coumadin 2mg  daily at discharge - Will enter coumadin 2mg  dose  for today in case patient does not get discharge this evening   Dia Sitter, PharmD, BCPS 10/29/2014 9:40 AM

## 2014-10-29 NOTE — Discharge Summary (Signed)
Physician Discharge Summary   Patient ID: Kendra Haney MRN: [id number] DOB/AGE: 1961/02/19 54 y.o.  Admit date: 10/24/2014 Discharge date: 10-29-2014  Primary Diagnosis:  Osteoarthritis Bilateral knee(s)  Admission Diagnoses:  Past Medical History  Diagnosis Date  . H/O varicella   . Yeast infection   . Menses, irregular 07/02/10  . Menopausal symptoms 10/14/10  . Vulvar itching 04/14/11  . Hydradenitis 04/14/11  . Complication of anesthesia     Nausea  & vomiting during eye surgery  . PONV (postoperative nausea and vomiting)   . Family history of adverse reaction to anesthesia     sister has problems with nausea and vomiting   . Hypertension     hx of hypertension no longer on meds   . Asthma     greater than 20 years ago   . Anxiety   . Depression   . GERD (gastroesophageal reflux disease)   . Arthritis   . Cancer     hx of skin cancer on back   . DVT (deep venous thrombosis)     12/2011    Discharge Diagnoses:   Principal Problem:   OA (osteoarthritis) of knee  Estimated body mass index is 41.61 kg/(m^2) as calculated from the following:   Height as of this encounter: 5' 10" (1.778 m).   Weight as of this encounter: 131.543 kg (290 lb).  Procedure:  Procedure(s) (LRB): TOTAL KNEE BILATERAL (Bilateral)   Consults: [facility]  HPI: Kendra Haney is a 54 y.o. year old female with end stage OA of both knees with progressively worsening pain and dysfunction. He has constant pain, with activity and at rest and significant functional deficits with difficulties even with ADLs. He has had extensive non-op management including analgesics, injections of cortisone and viscosupplements, and home exercise program, but remains in significant pain with significant dysfunction. We discussed replacing both knees in the same setting versus one at a time including procedure, risks, potential complications, rehab course, and pros and cons associated with each and the patient  elects to do both knees at the same time. She presents now for bilateral Total Knee Arthroplasty.   Laboratory Data: Admission on 10/24/2014  Component Date Value Ref Range Status  . ABO/RH(D) 10/24/2014 A POS   Final  . Antibody Screen 10/24/2014 NEG   Final  . Sample Expiration 10/24/2014 10/27/2014   Final  . ABO/RH(D) 10/24/2014 A POS   Final  . WBC 10/25/2014 7.4  4.0 - 10.5 K/uL Final  . RBC 10/25/2014 3.35* 3.87 - 5.11 MIL/uL Final  . Hemoglobin 10/25/2014 9.4* 12.0 - 15.0 g/dL Final  . HCT 10/25/2014 29.1* 36.0 - 46.0 % Final  . MCV 10/25/2014 86.9  78.0 - 100.0 fL Final  . MCH 10/25/2014 28.1  26.0 - 34.0 pg Final  . MCHC 10/25/2014 32.3  30.0 - 36.0 g/dL Final  . RDW 10/25/2014 15.3  11.5 - 15.5 % Final  . Platelets 10/25/2014 170  150 - 400 K/uL Final  . Sodium 10/25/2014 138  135 - 145 mmol/L Final  . Potassium 10/25/2014 4.2  3.5 - 5.1 mmol/L Final  . Chloride 10/25/2014 108  96 - 112 mmol/L Final  . CO2 10/25/2014 27  19 - 32 mmol/L Final  . Glucose, Bld 10/25/2014 130* 70 - 99 mg/dL Final  . BUN 10/25/2014 8  6 - 23 mg/dL Final  . Creatinine, Ser 10/25/2014 0.55  0.50 - 1.10 mg/dL Final  . Calcium 10/25/2014 8.1* 8.4 - 10.5 mg/dL Final  .  GFR calc non Af Amer 10/25/2014 >90  >90 mL/min Final  . GFR calc Af Amer 10/25/2014 >90  >90 mL/min Final   Comment: (NOTE) The eGFR has been calculated using the CKD EPI equation. This calculation has not been validated in all clinical situations. eGFR's persistently <90 mL/min signify possible Chronic Kidney Disease.   . Anion gap 10/25/2014 3* 5 - 15 Final  . Prothrombin Time 10/25/2014 16.2* 11.6 - 15.2 seconds Final  . INR 10/25/2014 1.29  0.00 - 1.49 Final  . WBC 10/26/2014 7.1  4.0 - 10.5 K/uL Final  . RBC 10/26/2014 3.23* 3.87 - 5.11 MIL/uL Final  . Hemoglobin 10/26/2014 9.2* 12.0 - 15.0 g/dL Final  . HCT 10/26/2014 28.7* 36.0 - 46.0 % Final  . MCV 10/26/2014 88.9  78.0 - 100.0 fL Final  . MCH 10/26/2014 28.5  26.0 -  34.0 pg Final  . MCHC 10/26/2014 32.1  30.0 - 36.0 g/dL Final  . RDW 10/26/2014 15.8* 11.5 - 15.5 % Final  . Platelets 10/26/2014 186  150 - 400 K/uL Final  . Sodium 10/26/2014 138  135 - 145 mmol/L Final  . Potassium 10/26/2014 4.0  3.5 - 5.1 mmol/L Final  . Chloride 10/26/2014 106  96 - 112 mmol/L Final  . CO2 10/26/2014 28  19 - 32 mmol/L Final  . Glucose, Bld 10/26/2014 106* 70 - 99 mg/dL Final  . BUN 10/26/2014 7  6 - 23 mg/dL Final  . Creatinine, Ser 10/26/2014 0.55  0.50 - 1.10 mg/dL Final  . Calcium 10/26/2014 8.1* 8.4 - 10.5 mg/dL Final  . GFR calc non Af Amer 10/26/2014 >90  >90 mL/min Final  . GFR calc Af Amer 10/26/2014 >90  >90 mL/min Final   Comment: (NOTE) The eGFR has been calculated using the CKD EPI equation. This calculation has not been validated in all clinical situations. eGFR's persistently <90 mL/min signify possible Chronic Kidney Disease.   . Anion gap 10/26/2014 4* 5 - 15 Final  . Prothrombin Time 10/26/2014 18.5* 11.6 - 15.2 seconds Final  . INR 10/26/2014 1.53* 0.00 - 1.49 Final  . WBC 10/27/2014 7.0  4.0 - 10.5 K/uL Final  . RBC 10/27/2014 3.31* 3.87 - 5.11 MIL/uL Final  . Hemoglobin 10/27/2014 9.2* 12.0 - 15.0 g/dL Final  . HCT 10/27/2014 29.3* 36.0 - 46.0 % Final  . MCV 10/27/2014 88.5  78.0 - 100.0 fL Final  . MCH 10/27/2014 27.8  26.0 - 34.0 pg Final  . MCHC 10/27/2014 31.4  30.0 - 36.0 g/dL Final  . RDW 10/27/2014 15.7* 11.5 - 15.5 % Final  . Platelets 10/27/2014 203  150 - 400 K/uL Final  . Prothrombin Time 10/27/2014 17.9* 11.6 - 15.2 seconds Final  . INR 10/27/2014 1.47  0.00 - 1.49 Final  . Prothrombin Time 10/28/2014 25.1* 11.6 - 15.2 seconds Final  . INR 10/28/2014 2.26* 0.00 - 1.49 Final  . Prothrombin Time 10/29/2014 24.6* 11.6 - 15.2 seconds Final  . INR 10/29/2014 2.20* 0.00 - 1.49 Final  Hospital Outpatient Visit on 10/15/2014  Component Date Value Ref Range Status  . aPTT 10/15/2014 33  24 - 37 seconds Final  . WBC 10/15/2014 4.5   4.0 - 10.5 K/uL Final  . RBC 10/15/2014 4.47  3.87 - 5.11 MIL/uL Final  . Hemoglobin 10/15/2014 12.2  12.0 - 15.0 g/dL Final  . HCT 10/15/2014 38.6  36.0 - 46.0 % Final  . MCV 10/15/2014 86.4  78.0 - 100.0 fL Final  . MCH  10/15/2014 27.3  26.0 - 34.0 pg Final  . MCHC 10/15/2014 31.6  30.0 - 36.0 g/dL Final  . RDW 10/15/2014 15.5  11.5 - 15.5 % Final  . Platelets 10/15/2014 270  150 - 400 K/uL Final  . Sodium 10/15/2014 139  135 - 145 mmol/L Final  . Potassium 10/15/2014 4.0  3.5 - 5.1 mmol/L Final  . Chloride 10/15/2014 105  96 - 112 mmol/L Final  . CO2 10/15/2014 26  19 - 32 mmol/L Final  . Glucose, Bld 10/15/2014 94  70 - 99 mg/dL Final  . BUN 10/15/2014 11  6 - 23 mg/dL Final  . Creatinine, Ser 10/15/2014 0.73  0.50 - 1.10 mg/dL Final  . Calcium 10/15/2014 9.4  8.4 - 10.5 mg/dL Final  . Total Protein 10/15/2014 7.2  6.0 - 8.3 g/dL Final  . Albumin 10/15/2014 4.1  3.5 - 5.2 g/dL Final  . AST 10/15/2014 15  0 - 37 U/L Final  . ALT 10/15/2014 12  0 - 35 U/L Final  . Alkaline Phosphatase 10/15/2014 49  39 - 117 U/L Final  . Total Bilirubin 10/15/2014 0.6  0.3 - 1.2 mg/dL Final  . GFR calc non Af Amer 10/15/2014 >90  >90 mL/min Final  . GFR calc Af Amer 10/15/2014 >90  >90 mL/min Final   Comment: (NOTE) The eGFR has been calculated using the CKD EPI equation. This calculation has not been validated in all clinical situations. eGFR's persistently <90 mL/min signify possible Chronic Kidney Disease.   . Anion gap 10/15/2014 8  5 - 15 Final  . Prothrombin Time 10/15/2014 14.7  11.6 - 15.2 seconds Final  . INR 10/15/2014 1.13  0.00 - 1.49 Final  . Color, Urine 10/15/2014 YELLOW  YELLOW Final  . APPearance 10/15/2014 CLOUDY* CLEAR Final  . Specific Gravity, Urine 10/15/2014 1.020  1.005 - 1.030 Final  . pH 10/15/2014 6.0  5.0 - 8.0 Final  . Glucose, UA 10/15/2014 NEGATIVE  NEGATIVE mg/dL Final  . Hgb urine dipstick 10/15/2014 TRACE* NEGATIVE Final  . Bilirubin Urine 10/15/2014  NEGATIVE  NEGATIVE Final  . Ketones, ur 10/15/2014 NEGATIVE  NEGATIVE mg/dL Final  . Protein, ur 10/15/2014 NEGATIVE  NEGATIVE mg/dL Final  . Urobilinogen, UA 10/15/2014 0.2  0.0 - 1.0 mg/dL Final  . Nitrite 10/15/2014 NEGATIVE  NEGATIVE Final  . Leukocytes, UA 10/15/2014 LARGE* NEGATIVE Final  . MRSA, PCR 10/15/2014 NEGATIVE  NEGATIVE Final  . Staphylococcus aureus 10/15/2014 NEGATIVE  NEGATIVE Final   Comment:        The Xpert SA Assay (FDA approved for NASAL specimens in patients over 50 years of age), is one component of a comprehensive surveillance program.  Test performance has been validated by [facility] for patients greater than or equal to 68 year old. It is not intended to diagnose infection nor to guide or monitor treatment.   . Preg, Serum 10/15/2014 NEGATIVE  NEGATIVE Final   Comment:        THE SENSITIVITY OF THIS METHODOLOGY IS >10 mIU/mL.   Marland Kitchen Squamous Epithelial / LPF 10/15/2014 RARE  RARE Final  . WBC, UA 10/15/2014 11-20  <3 WBC/hpf Final  . RBC / HPF 10/15/2014 0-2  <3 RBC/hpf Final  . Bacteria, UA 10/15/2014 MANY* RARE Final     X-Rays:No results found.  EKG:No orders found for this or any previous visit.   Hospital Course: Patient was admitted to [facility] and taken to the OR and underwent the above stated procedure well  without complications. Epidural was placed at the time of surgery.  Patient tolerated the procedure well and was later transferred to the recovery room and then to the orthopaedic floor for postoperative care. Anesthesia was consulted postoperatively to place an epidural in for postoperative pain management. The patient was also given PO and IV analgesics for pain control following their surgery.  They were given 24 hours of postoperative antibiotics and started on DVT prophylaxis in the form of Lovenox and Coumadin after the epidural had been removed.   PT and OT were ordered for total joint protocol.  Discharge planning  consulted to help with postop disposition and equipment needs.  Consult was called to CIR for possible stay in acute rehab.  Patient had a good night on the evening of surgery and started to get up OOB with therapy on day one.  Hemovac drains were pulled without difficulty on day one.  Continued to work with therapy into day two.  Dressings were changed on day two and both incisions were healing well.  The epidural was removed without difficulty by Anesthesia on day two.  By day three, the patient started to show progress with therapy.  They continued to receive therapy each day for continued total knee protocol.  The incisions were healing well.  They continued to progress on day four and day five at which time the patient was seen in rounds by Dr. Wynelle Link and was ready to go to CIR.     Take Coumadin for 4 weeks and then discontinue.  The dose may need to be adjusted based upon the INR.  Please follow the INR and titrate Coumadin dose for a therapeutic range between 2.0 and 3.0 INR.  After completing the 4 weeks of Coumadin, the patient may stop the Coumadin and take an 81 mg Aspirin daily for three more weeks.  Continue Lovenox injections until the INR is therapeutic at or greater than 2.0.  When INR reaches the therapeutic level of equal to or greater than 2.0, the patient may discontinue the Lovenox injections.   Diet: Cardiac diet Activity:WBAT Follow-up:in 2 weeks Disposition - Rehab Discharged Condition: good    Medication List    STOP taking these medications        HYDROcodone-acetaminophen 7.5-325 MG per tablet  Commonly known as:  NORCO      TAKE these medications        ALPRAZolam 1 MG tablet  Commonly known as:  XANAX  Take 0.5-3 mg by mouth 3 (three) times daily as needed for anxiety.     bisacodyl 10 MG suppository  Commonly known as:  DULCOLAX  Place 1 suppository (10 mg total) rectally daily as needed for moderate constipation.     BUSPAR 15 MG tablet  Generic  drug:  busPIRone  Take 15 mg by mouth 3 (three) times daily as needed (DEPRESSION). Patient takes on 15 mg in the am and then as needed the rest of the day.     docusate sodium 100 MG capsule  Commonly known as:  COLACE  Take 1 capsule (100 mg total) by mouth 2 (two) times daily.     fexofenadine 180 MG tablet  Commonly known as:  ALLEGRA  Take 180 mg by mouth daily.     methocarbamol 500 MG tablet  Commonly known as:  ROBAXIN  Take 1 tablet (500 mg total) by mouth every 6 (six) hours as needed for muscle spasms.     metoCLOPramide 5 MG tablet  Commonly  known as:  REGLAN  Take 1-2 tablets (5-10 mg total) by mouth every 8 (eight) hours as needed for nausea (if ondansetron (ZOFRAN) ineffective.).     NEXIUM 40 MG capsule  Generic drug:  esomeprazole  TAKE 1 CAPSULE DAILY     nitrofurantoin 100 MG capsule  Commonly known as:  MACRODANTIN  Take 100 mg by mouth 4 (four) times daily. Patient completed on 10/15/2014.     ondansetron 4 MG tablet  Commonly known as:  ZOFRAN  Take 1 tablet (4 mg total) by mouth every 6 (six) hours as needed for nausea.     oxyCODONE 5 MG immediate release tablet  Commonly known as:  Oxy IR/ROXICODONE  Take 1-4 tablets (5-20 mg total) by mouth every 3 (three) hours as needed for moderate pain, severe pain or breakthrough pain.     polyethylene glycol packet  Commonly known as:  MIRALAX / GLYCOLAX  Take 17 g by mouth daily as needed for mild constipation.     traZODone 100 MG tablet  Commonly known as:  DESYREL  Take 100 mg by mouth at bedtime as needed for sleep.     triamcinolone cream 0.1 %  Commonly known as:  KENALOG  Apply 1 application topically 2 (two) times daily. As needed     TYLENOL 500 MG tablet  Generic drug:  acetaminophen  Take 650 mg by mouth every 6 (six) hours as needed for mild pain.     warfarin 2 MG tablet  Commonly known as:  COUMADIN  Take 1 tablet (2 mg total) by mouth daily. Take Coumadin for four weeks and then  discontinue.  The dose may need to be adjusted based upon the INR.  Please follow the INR and titrate Coumadin dose for a therapeutic range between 2.0 and 3.0 INR.  After completing the four weeks of Coumadin, the patient may stop the Coumadin and take 81 mg Aspirin daily for three more weeks.     WELLBUTRIN XL 150 MG 24 hr tablet  Generic drug:  buPROPion  Take 450 mg by mouth every morning. Pt takes 3 tabs daily.       Follow-up Information    Follow up with [name], MD. Schedule an appointment as soon as possible for a visit in 2 weeks.   Specialty:  Orthopedic Surgery   Why:  Call office at [phone] to setup appointment with Dr. Wynelle Link in two weeks or following discharge from Rehab unit.   Contact information:   [address] [place] [address] [phone]       Signed: Arlee Muslim, PA-C Orthopaedic Surgery 10/29/2014, 7:04 AM

## 2014-10-29 NOTE — Progress Notes (Signed)
   Subjective: 5 Days Post-Op Procedure(s) (LRB): TOTAL KNEE BILATERAL (Bilateral) Patient reports pain as mild.   Plan is to go Rehab after hospital stay.  Objective: Vital signs in last 24 hours: Temp:  [97.8 F (36.6 C)-99 F (37.2 C)] 99 F (37.2 C) (04/18 0510) Pulse Rate:  [82-93] 82 (04/18 0510) Resp:  [18] 18 (04/18 0510) BP: (113-133)/(61-66) 133/66 mmHg (04/18 0510) SpO2:  [98 %-100 %] 99 % (04/18 0510)  Intake/Output from previous day:  Intake/Output Summary (Last 24 hours) at 10/29/14 0653 Last data filed at 10/29/14 0510  Gross per 24 hour  Intake   1440 ml  Output   1000 ml  Net    440 ml    Intake/Output this shift: Total I/O In: 600 [P.O.:600] Out: -   Labs:  Recent Labs  10/27/14 0459  HGB 9.2*    Recent Labs  10/27/14 0459  WBC 7.0  RBC 3.31*  HCT 29.3*  PLT 203   No results for input(s): NA, K, CL, CO2, BUN, CREATININE, GLUCOSE, CALCIUM in the last 72 hours.  Recent Labs  10/28/14 0538 10/29/14 0443  INR 2.26* 2.20*    EXAM General - Patient is Alert, Appropriate and Oriented Extremity - Neurologically intact Neurovascular intact No cellulitis present Compartment soft Dressing/Incision - clean, dry, no drainage Motor Function - intact, moving foot and toes well on exam.   Past Medical History  Diagnosis Date  . H/O varicella   . Yeast infection   . Menses, irregular 07/02/10  . Menopausal symptoms 10/14/10  . Vulvar itching 04/14/11  . Hydradenitis 04/14/11  . Complication of anesthesia     Nausea  & vomiting during eye surgery  . PONV (postoperative nausea and vomiting)   . Family history of adverse reaction to anesthesia     sister has problems with nausea and vomiting   . Hypertension     hx of hypertension no longer on meds   . Asthma     greater than 20 years ago   . Anxiety   . Depression   . GERD (gastroesophageal reflux disease)   . Arthritis   . Cancer     hx of skin cancer on back   . DVT (deep venous  thrombosis)     12/2011     Assessment/Plan: 5 Days Post-Op Procedure(s) (LRB): TOTAL KNEE BILATERAL (Bilateral) Principal Problem:   OA (osteoarthritis) of knee   Up with therapy  Discharge to CIR today  DVT Prophylaxis - Coumadin Weight-Bearing as tolerated to bilateral legs  Talita Recht V 10/29/2014, 6:53 AM

## 2014-10-29 NOTE — Progress Notes (Signed)
Clinical Social Work Department BRIEF PSYCHOSOCIAL ASSESSMENT 10/29/2014  Patient:  Kendra Haney, Kendra Haney     Account Number:  [id number]     Admit date:  10/24/2014  Clinical Social Worker:  Lacie Scotts  Date/Time:  10/29/2014 11:00 AM  Referred by:  Physician  Date Referred:  10/29/2014 Referred for  SNF Placement   Other Referral:   Interview type:   Other interview type:    PSYCHOSOCIAL DATA Living Status:  ALONE Admitted from facility:   Level of care:   Primary support name:  Katherinne Mofield Primary support relationship to patient:  SIBLING Degree of support available:   supportive    CURRENT CONCERNS Current Concerns  Post-Acute Placement   Other Concerns:    SOCIAL WORK ASSESSMENT / PLAN Pt is a 54 yr old female living at home prior to hospitalization. CSW met with pt on 4/15 and again this am to assist with d/c planning. This is a planned admission. Pt was hoping to have ST rehab at [facility] following hospital d/c.  CIR has contacted pt today reporting there are no openings. SNF search has been initiated and pt has accepted a rehab bed at [facility] . MD has been notified and will d/c pt to [facility] today.   Assessment/plan status:  Psychosocial Support/Ongoing Assessment of Needs Other assessment/ plan:   Information/referral to community resources:   Insurance coverage for SNF and ambulance transport has been reviewed.    PATIENT'S/FAMILY'S RESPONSE TO PLAN OF CARE: " I understand my plans have changed. I still need rehab before going home so I will go to [facility]."  Pt's mood is bright. She has accepted this change in plans very well.She is looking forward to d/c today. Pt is motivated to continue working with therapy.    Werner Lean LCSW [phone]

## 2014-10-30 ENCOUNTER — Encounter: Payer: Self-pay | Admitting: Adult Health

## 2014-10-30 ENCOUNTER — Non-Acute Institutional Stay (SKILLED_NURSING_FACILITY): Payer: 59 | Admitting: Adult Health

## 2014-10-30 DIAGNOSIS — K219 Gastro-esophageal reflux disease without esophagitis: Secondary | ICD-10-CM | POA: Diagnosis not present

## 2014-10-30 DIAGNOSIS — G4700 Insomnia, unspecified: Secondary | ICD-10-CM | POA: Diagnosis not present

## 2014-10-30 DIAGNOSIS — K5901 Slow transit constipation: Secondary | ICD-10-CM | POA: Diagnosis not present

## 2014-10-30 DIAGNOSIS — D62 Acute posthemorrhagic anemia: Secondary | ICD-10-CM

## 2014-10-30 DIAGNOSIS — M170 Bilateral primary osteoarthritis of knee: Secondary | ICD-10-CM

## 2014-10-30 DIAGNOSIS — F32A Depression, unspecified: Secondary | ICD-10-CM

## 2014-10-30 DIAGNOSIS — J45901 Unspecified asthma with (acute) exacerbation: Secondary | ICD-10-CM

## 2014-10-30 DIAGNOSIS — F329 Major depressive disorder, single episode, unspecified: Secondary | ICD-10-CM

## 2014-10-30 DIAGNOSIS — F419 Anxiety disorder, unspecified: Secondary | ICD-10-CM | POA: Diagnosis not present

## 2014-10-30 NOTE — Progress Notes (Addendum)
Patient ID: Kendra Haney, female   DOB: February 07, 1961, 54 y.o.   MRN: [id number]   10/30/2014  Facility:  Nursing Home Location:  [place] Room Number: 101-P LEVEL OF CARE:  SNF (31)   Chief Complaint  Patient presents with  . Hospitalization Follow-up    Osteoarthritis S/P bilateral total knee arthroplasty, anxiety, constipation, asthma, GERD, depression, insomnia and anemia    HISTORY OF PRESENT ILLNESS:  This is a 54 year old female who has been admitted to [facility] on 10/29/14 from [facility] with osteoarthritis S/P bilateral total knee arthroplasty. She has PMH of DVT in 2013, history of skin cancer, GERD, asthma and hypertension. She has been admitted for a short-term rehabilitation.  She is seen in her room today. She is able to ambulate with walker. She complained of constipation.  PAST MEDICAL HISTORY:  Past Medical History  Diagnosis Date  . H/O varicella   . Yeast infection   . Menses, irregular 07/02/10  . Menopausal symptoms 10/14/10  . Vulvar itching 04/14/11  . Hydradenitis 04/14/11  . Complication of anesthesia     Nausea  & vomiting during eye surgery  . PONV (postoperative nausea and vomiting)   . Family history of adverse reaction to anesthesia     sister has problems with nausea and vomiting   . Hypertension     hx of hypertension no longer on meds   . Asthma     greater than 20 years ago   . Anxiety   . Depression   . GERD (gastroesophageal reflux disease)   . Arthritis   . Cancer     hx of skin cancer on back   . DVT (deep venous thrombosis)     12/2011     CURRENT MEDICATIONS: Reviewed per MAR/see medication list  Allergies  Allergen Reactions  . Doxycycline Nausea And Vomiting  . Dust Mite Extract     UNKNOWN  . Mold Extract [Trichophyton Mentagrophyte]     UNKNOWN  . Nsaids Other (See Comments)    GI UPSET   . Tetracyclines & Related Nausea And Vomiting     REVIEW OF SYSTEMS:  GENERAL: no  change in appetite, no fatigue, no weight changes, no fever, chills or weakness RESPIRATORY: no cough, SOB, DOE, wheezing, hemoptysis CARDIAC: no chest pain, edema or palpitations GI: no abdominal pain, diarrhea, heart burn, nausea or vomiting, +constipation  PHYSICAL EXAMINATION  GENERAL: no acute distress, obese SKIN:  Bilateral knee surgical incision has steri-strips, no erythema, dry EYES: conjunctivae normal, sclerae normal, normal eye lids NECK: supple, trachea midline, no neck masses, no thyroid tenderness, no thyromegaly LYMPHATICS: no LAN in the neck, no supraclavicular LAN RESPIRATORY: breathing is even & unlabored, BS CTAB CARDIAC: RRR, no murmur,no extra heart sounds, no edema GI: abdomen soft, normal BS, no masses, no tenderness, no hepatomegaly, no splenomegaly EXTREMITIES:  Able to move X 4 extremities; ambulates with walker PSYCHIATRIC: the patient is alert & oriented to person, affect & behavior appropriate  LABS/RADIOLOGY: Labs reviewed: Basic Metabolic Panel:  Recent Labs  10/15/14 0945 10/25/14 0522 10/26/14 0510  NA 139 138 138  K 4.0 4.2 4.0  CL 105 108 106  CO2 26 27 28   GLUCOSE 94 130* 106*  BUN 11 8 7   CREATININE 0.73 0.55 0.55  CALCIUM 9.4 8.1* 8.1*   Liver Function Tests:  Recent Labs  10/15/14 0945  AST 15  ALT 12  ALKPHOS 49  BILITOT 0.6  PROT 7.2  ALBUMIN 4.1   CBC:  Recent Labs  10/25/14 0522 10/26/14 0510 10/27/14 0459  WBC 7.4 7.1 7.0  HGB 9.4* 9.2* 9.2*  HCT 29.1* 28.7* 29.3*  MCV 86.9 88.9 88.5  PLT 170 186 203     ASSESSMENT/PLAN:  Osteoarthritis S/P Bilateral Total Knee Arthroplasty - for rehabilitation; continue Coumadin 4 weeks then aspirating 81 mg by mouth daily 3 weeks for DVT prophylaxis; Robaxin 500 mg by mouth every 6 hours when necessary for muscle spasm; and OxyIR 5 mg 1-3 tabs by mouth every 3 hours when necessary for pain   Anxiety - mode this is stable; continue BuSpar 15 mg by mouth every morning &  Xanax 0.5 mg by mouth 3 times a day when necessary Constipation - discontinue Colace; start senna S2 tabs by mouth twice a day and increase MiraLAX to 17 g +4-6 ounces liquid by mouth twice a day Asthma - continue Allegra 180 mg by mouth daily GERD - continue Nexium 40 mg by mouth daily Depression - continue Wellbutrin XL 450 mg by mouth daily Insomnia - continue trazodone 100 mg by mouth daily at bedtime when necessary Anemia - hemoglobin 9.2; will monitor    Goals of care:  Short-term rehabilitation   Labs/test ordered:    CBC and BMP  Spent 50 minutes in patient care.    [facility], NP [employer] [phone]

## 2014-10-31 ENCOUNTER — Non-Acute Institutional Stay (SKILLED_NURSING_FACILITY): Payer: 59 | Admitting: Internal Medicine

## 2014-10-31 DIAGNOSIS — D62 Acute posthemorrhagic anemia: Secondary | ICD-10-CM

## 2014-10-31 DIAGNOSIS — K219 Gastro-esophageal reflux disease without esophagitis: Secondary | ICD-10-CM

## 2014-10-31 DIAGNOSIS — G4700 Insomnia, unspecified: Secondary | ICD-10-CM

## 2014-10-31 DIAGNOSIS — I82401 Acute embolism and thrombosis of unspecified deep veins of right lower extremity: Secondary | ICD-10-CM | POA: Diagnosis not present

## 2014-10-31 DIAGNOSIS — F329 Major depressive disorder, single episode, unspecified: Secondary | ICD-10-CM | POA: Diagnosis not present

## 2014-10-31 DIAGNOSIS — K5901 Slow transit constipation: Secondary | ICD-10-CM

## 2014-10-31 DIAGNOSIS — F411 Generalized anxiety disorder: Secondary | ICD-10-CM | POA: Diagnosis not present

## 2014-10-31 DIAGNOSIS — F32A Depression, unspecified: Secondary | ICD-10-CM

## 2014-10-31 DIAGNOSIS — M170 Bilateral primary osteoarthritis of knee: Secondary | ICD-10-CM | POA: Diagnosis not present

## 2014-10-31 NOTE — Progress Notes (Signed)
Patient ID: Kendra Haney, female   DOB: [birth year], 54 y.o.   MRN: [id number]     [facility]   PCP: Maximino Greenland, MD  Code Status: full code  Allergies  Allergen Reactions  . Doxycycline Nausea And Vomiting  . Dust Mite Extract     UNKNOWN  . Mold Extract [Trichophyton Mentagrophyte]     UNKNOWN  . Nsaids Other (See Comments)    GI UPSET   . Tetracyclines & Related Nausea And Vomiting    Chief Complaint  Patient presents with  . New Admit To SNF     HPI:  54year old patient is here for short term rehabilitation post hospital admission from 10/24/14-10/29/14 with bilateral severe knee OA. She underwent bilateral total knee arthroplasty. She has PMH of DVT, GERD, asthma and hypertension. She is seen in her room today. She mentions that her pain is under control with current regimen and muscle relaxant has been helpful. She feels anxious, mentions that she has not been getting her anxiety pills the way she took at home and feels she is strong enough to go home and do therapy at outpatient setting and do exercise at home by herself. Was having trouble with bowel movement but with medication adjustment, she had a bowel movement last night. She denies any other concerns.  Review of Systems:  Constitutional: Negative for fever, chills, diaphoresis.  HENT: Negative for headache, congestion, nasal discharge Eyes: Negative for eye pain, blurred vision, double vision and discharge.  Respiratory: Negative for cough, shortness of breath and wheezing.   Cardiovascular: Negative for chest pain, palpitations, leg swelling.  Gastrointestinal: Negative for heartburn, nausea, vomiting, abdominal pain Genitourinary: Negative for dysuria Musculoskeletal: Negative for back pain, falls Skin: Negative for itching, rash.  Neurological: Negative for dizziness, tingling, focal weakness Psychiatric/Behavioral: positive for anxiety  Past Medical History  Diagnosis  Date  . H/O varicella   . Yeast infection   . Menses, irregular 07/02/10  . Menopausal symptoms 10/14/10  . Vulvar itching 04/14/11  . Hydradenitis 04/14/11  . Complication of anesthesia     Nausea  & vomiting during eye surgery  . PONV (postoperative nausea and vomiting)   . Family history of adverse reaction to anesthesia     sister has problems with nausea and vomiting   . Hypertension     hx of hypertension no longer on meds   . Asthma     greater than 20 years ago   . Anxiety   . Depression   . GERD (gastroesophageal reflux disease)   . Arthritis   . Cancer     hx of skin cancer on back   . DVT (deep venous thrombosis)     12/2011    Past Surgical History  Procedure Laterality Date  . Wisdom tooth extraction    . 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 Tonsillectomy      54 years old  . Total knee arthroplasty Bilateral 10/24/2014    Procedure: TOTAL KNEE BILATERAL;  Surgeon: Gaynelle Arabian, MD;  Location: [facility];  Service: Orthopedics;  Laterality: Bilateral;   Social History:   reports that she has never smoked. She has never used smokeless tobacco. She reports that she does not drink alcohol or use illicit drugs.  Family History  Problem Relation Age of Onset  . Hypertension Paternal Grandmother   . Cancer Father   . Hypertension Mother  Medications: Patient's Medications  New Prescriptions   No medications on file  Previous Medications   ACETAMINOPHEN (TYLENOL) 500 MG TABLET    Take 650 mg by mouth every 6 (six) hours as needed for mild pain.    ALPRAZOLAM (XANAX) 1 MG TABLET    Take 0.5-3 mg by mouth 3 (three) times daily as needed for anxiety.    BISACODYL (DULCOLAX) 10 MG SUPPOSITORY    Place 1 suppository (10 mg total) rectally daily as needed for moderate constipation.   BUPROPION (WELLBUTRIN XL) 150 MG 24 HR TABLET    Take 450 mg by mouth every morning. Pt takes 3 tabs daily.   BUSPIRONE (BUSPAR) 15 MG TABLET    Take 15 mg by mouth 3  (three) times daily as needed (DEPRESSION). Patient takes on 15 mg in the am and then as needed the rest of the day.   DOCUSATE SODIUM (COLACE) 100 MG CAPSULE    Take 1 capsule (100 mg total) by mouth 2 (two) times daily.   FEXOFENADINE (ALLEGRA) 180 MG TABLET    Take 180 mg by mouth daily.   METHOCARBAMOL (ROBAXIN) 500 MG TABLET    Take 1 tablet (500 mg total) by mouth every 6 (six) hours as needed for muscle spasms.   METOCLOPRAMIDE (REGLAN) 5 MG TABLET    Take 1-2 tablets (5-10 mg total) by mouth every 8 (eight) hours as needed for nausea (if ondansetron (ZOFRAN) ineffective.).   NEXIUM 40 MG CAPSULE    TAKE 1 CAPSULE DAILY   NITROFURANTOIN (MACRODANTIN) 100 MG CAPSULE    Take 100 mg by mouth 4 (four) times daily. Patient completed on 10/15/2014.   ONDANSETRON (ZOFRAN) 4 MG TABLET    Take 1 tablet (4 mg total) by mouth every 6 (six) hours as needed for nausea.   OXYCODONE (OXY IR/ROXICODONE) 5 MG IMMEDIATE RELEASE TABLET    Take 1-4 tablets (5-20 mg total) by mouth every 3 (three) hours as needed for moderate pain, severe pain or breakthrough pain.   POLYETHYLENE GLYCOL (MIRALAX / GLYCOLAX) PACKET    Take 17 g by mouth daily as needed for mild constipation.   TRAZODONE (DESYREL) 100 MG TABLET    Take 100 mg by mouth at bedtime as needed for sleep.    TRIAMCINOLONE CREAM (KENALOG) 0.1 %    Apply 1 application topically 2 (two) times daily. As needed   WARFARIN (COUMADIN) 2 MG TABLET    Take 1 tablet (2 mg total) by mouth daily. Take Coumadin for four weeks and then discontinue.  The dose may need to be adjusted based upon the INR.  Please follow the INR and titrate Coumadin dose for a therapeutic range between 2.0 and 3.0 INR.  After completing the four weeks of Coumadin, the patient may stop the Coumadin and take 81 mg Aspirin daily for three more weeks.  Modified Medications   No medications on file  Discontinued Medications   No medications on file     Physical Exam: Filed Vitals:   10/31/14  1143  BP: 132/64  Pulse: 84  Temp: 98.9 F (37.2 C)  Resp: 16  SpO2: 98%    General- adult female, obese, in no acute distress Head- normocephalic, atraumatic Throat- moist mucus membrane Eyes- PERRLA, EOMI, no pallor, no icterus, no discharge Neck- no cervical lymphadenopathy Cardiovascular- normal s1,s2, no murmurs, palpable dorsalis pedis and radial pulses, 1+ leg edema Respiratory- bilateral clear to auscultation, no wheeze, no rhonchi, no crackles, no use of accessory muscles  Abdomen- bowel sounds present, soft, non tender Musculoskeletal- able to move all 4 extremities, limited leg range of motion, generalized weakness, using walker with assistance  Neurological- no focal deficit Skin- warm and dry, bilateral knee incision with steri strips, mild peri-incisonal erythema, no drainage, dressing clean and dry Psychiatry- alert and oriented to person, place and time, anxious    Labs reviewed: Basic Metabolic Panel:  Recent Labs  10/15/14 0945 10/25/14 0522 10/26/14 0510  NA 139 138 138  K 4.0 4.2 4.0  CL 105 108 106  CO2 26 27 28   GLUCOSE 94 130* 106*  BUN 11 8 7   CREATININE 0.73 0.55 0.55  CALCIUM 9.4 8.1* 8.1*   Liver Function Tests:  Recent Labs  10/15/14 0945  AST 15  ALT 12  ALKPHOS 49  BILITOT 0.6  PROT 7.2  ALBUMIN 4.1   No results for input(s): LIPASE, AMYLASE in the last 8760 hours. No results for input(s): AMMONIA in the last 8760 hours. CBC:  Recent Labs  10/25/14 0522 10/26/14 0510 10/27/14 0459  WBC 7.4 7.1 7.0  HGB 9.4* 9.2* 9.2*  HCT 29.1* 28.7* 29.3*  MCV 86.9 88.9 88.5  PLT 170 186 203    Assessment/Plan  Bilateral knee Osteoarthritis  S/P Bilateral Total Knee Arthroplasty. Will have her work with physical therapy and occupational therapy team to help with gait training and muscle strengthening exercises.fall precautions. Skin care. Encourage to be out of bed. Advised patient to continue therapy at SNF given that she lives  alone at home and needs gait training to get around curb and steps in her house in a safe manner. Patient voices understanding this and agrees to stay till Friday. She would then like outpatient therapy. advised on home health services prior to outpatient therapy given her anxiety, living alone situation. Patient indecisive at present. Will need to arrange for home health services for over the weekend. continue Coumadin for total of 4 weeks then aspirin 81 mg daily for 3 weeks for DVT prophylaxis. Monitor inr for goal of 2-3. continue Robaxin 500 mg q 6 hours prn for muscle spasm and OxyIR 5 mg 1-3 tabs q3 hours prn pain. will need f.u with Dr Raphael Gibney in 2 weeks  Blood loss anemia Post op, check h&h  Generalized Anxiety disorder continue BuSpar 15 mg but change it to 6 am, to give additional 15 mg in 30 minutes if no improvement followed by prn xanax 0.5 mg tid. Monitor clinically.   Constipation Continue senna S 2 tabs bid and miralax bid for now, encouraged hydration  DVT Continue coumadin with goal inr 2-3  GERD  Continue her PPI  Depression continue Wellbutrin XL 450 mg daily  Insomnia  continue trazodone 100 mg daily prn   Goals of care: short term rehabilitation   Labs/tests ordered: cbc  Family/ staff Communication: reviewed care plan with patient and nursing supervisor    Blanchie Serve, MD  [facility] [phone] (Monday-Friday 8 am - 5 pm) [phone] (afterhours)

## 2014-11-01 ENCOUNTER — Non-Acute Institutional Stay (SKILLED_NURSING_FACILITY): Payer: 59 | Admitting: Adult Health

## 2014-11-01 ENCOUNTER — Encounter: Payer: Self-pay | Admitting: Adult Health

## 2014-11-01 DIAGNOSIS — G4700 Insomnia, unspecified: Secondary | ICD-10-CM

## 2014-11-01 DIAGNOSIS — D62 Acute posthemorrhagic anemia: Secondary | ICD-10-CM | POA: Diagnosis not present

## 2014-11-01 DIAGNOSIS — K219 Gastro-esophageal reflux disease without esophagitis: Secondary | ICD-10-CM | POA: Diagnosis not present

## 2014-11-01 DIAGNOSIS — M170 Bilateral primary osteoarthritis of knee: Secondary | ICD-10-CM

## 2014-11-01 DIAGNOSIS — J45909 Unspecified asthma, uncomplicated: Secondary | ICD-10-CM | POA: Diagnosis not present

## 2014-11-01 DIAGNOSIS — K5901 Slow transit constipation: Secondary | ICD-10-CM | POA: Diagnosis not present

## 2014-11-01 DIAGNOSIS — F329 Major depressive disorder, single episode, unspecified: Secondary | ICD-10-CM

## 2014-11-01 DIAGNOSIS — F32A Depression, unspecified: Secondary | ICD-10-CM

## 2014-11-01 DIAGNOSIS — F419 Anxiety disorder, unspecified: Secondary | ICD-10-CM | POA: Diagnosis not present

## 2014-11-01 NOTE — Progress Notes (Signed)
Patient ID: Kendra Haney, female   DOB: [birth year], 54 y.o.   MRN: [id number]   11/01/2014  Facility:  Nursing Home Location:  [place] Room Number: 101-P LEVEL OF CARE:  SNF (31)   Chief Complaint  Patient presents with  . Discharge Note    Osteoarthritis S/P bilateral total knee arthroplasty, anxiety, constipation, asthma, GERD, depression, insomnia and anemia    HISTORY OF PRESENT ILLNESS:  This is a 54 year old female who is for discharge home and will have outpatient rehabilitation.  DME:  Bariatric rolling walker . She has been admitted to [facility] on 10/29/14 from [facility] with osteoarthritis S/P bilateral total knee arthroplasty. She has PMH of DVT in 2013, history of skin cancer, GERD, asthma and hypertension.   Patient was admitted to this facility for short-term rehabilitation after the patient's recent hospitalization.  Patient has completed SNF rehabilitation and therapy has cleared the patient for discharge.  PAST MEDICAL HISTORY:  Past Medical History  Diagnosis Date  . H/O varicella   . Yeast infection   . Menses, irregular 07/02/10  . Menopausal symptoms 10/14/10  . Vulvar itching 04/14/11  . Hydradenitis 04/14/11  . Complication of anesthesia     Nausea  & vomiting during eye surgery  . PONV (postoperative nausea and vomiting)   . Family history of adverse reaction to anesthesia     sister has problems with nausea and vomiting   . Hypertension     hx of hypertension no longer on meds   . Asthma     greater than 20 years ago   . Anxiety   . Depression   . GERD (gastroesophageal reflux disease)   . Arthritis   . Cancer     hx of skin cancer on back   . DVT (deep venous thrombosis)     12/2011     CURRENT MEDICATIONS: Reviewed per MAR/see medication list  Allergies  Allergen Reactions  . Doxycycline Nausea And Vomiting  . Dust Mite Extract     UNKNOWN  . Mold Extract [Trichophyton Mentagrophyte]    UNKNOWN  . Nsaids Other (See Comments)    GI UPSET   . Tetracyclines & Related Nausea And Vomiting     REVIEW OF SYSTEMS:  GENERAL: no change in appetite, no fatigue, no weight changes, no fever, chills or weakness RESPIRATORY: no cough, SOB, DOE, wheezing, hemoptysis CARDIAC: no chest pain, edema or palpitations GI: no abdominal pain, diarrhea, heart burn, nausea or vomiting  PHYSICAL EXAMINATION  GENERAL: no acute distress, obese SKIN:  Bilateral knee surgical incision has steri-strips, no erythema, dry NECK: supple, trachea midline, no neck masses, no thyroid tenderness, no thyromegaly LYMPHATICS: no LAN in the neck, no supraclavicular LAN RESPIRATORY: breathing is even & unlabored, BS CTAB CARDIAC: RRR, no murmur,no extra heart sounds, no edema GI: abdomen soft, normal BS, no masses, no tenderness, no hepatomegaly, no splenomegaly EXTREMITIES:  Able to move X 4 extremities; ambulates with walker PSYCHIATRIC: the patient is alert & oriented to person, affect & behavior appropriate  LABS/RADIOLOGY: Labs reviewed: Basic Metabolic Panel:  Recent Labs  10/15/14 0945 10/25/14 0522 10/26/14 0510  NA 139 138 138  K 4.0 4.2 4.0  CL 105 108 106  CO2 26 27 28   GLUCOSE 94 130* 106*  BUN 11 8 7   CREATININE 0.73 0.55 0.55  CALCIUM 9.4 8.1* 8.1*   Liver Function Tests:  Recent Labs  10/15/14 0945  AST 15  ALT 12  ALKPHOS 49  BILITOT 0.6  PROT 7.2  ALBUMIN 4.1   CBC:  Recent Labs  10/25/14 0522 10/26/14 0510 10/27/14 0459  WBC 7.4 7.1 7.0  HGB 9.4* 9.2* 9.2*  HCT 29.1* 28.7* 29.3*  MCV 86.9 88.9 88.5  PLT 170 186 203     ASSESSMENT/PLAN:  Osteoarthritis S/P Bilateral Total Knee Arthroplasty - for rehabilitation; continue Coumadin 3 weeks then aspirin 81 mg by mouth daily 3 weeks for DVT prophylaxis; Robaxin 500 mg by mouth every 6 hours when necessary for muscle spasm; and OxyIR 5 mg 1-3 tabs by mouth every 3 hours when necessary for pain   Anxiety -  mood is stable; continue BuSpar 15 mg by mouth every morning, Buspar 15 mg PO Q 8 hours PRN  & Xanax 0.5 mg by mouth 3 times a day when necessary Constipation - continue senna S2 tabs by mouth twice a day and MiraLAX to 17 g +4-6 ounces liquid by mouth twice a day Asthma - continue Allegra 180 mg by mouth daily GERD - continue Nexium 40 mg by mouth daily Depression - continue Wellbutrin XL 450 mg by mouth daily Insomnia - continue trazodone 100 mg by mouth daily at bedtime when necessary Anemia - hemoglobin 9.2; stable Long-term use of anticoagulant - INR 1.4, subtherapeutic; give Coumadin 3 mg PO X 1 today then Coumadin 2.5 mg PO Q D; INR on 11/06/14   I have filled out patient's discharge paperwork and written prescriptions.  Patient will have outpatient rehabilitation.  DME provided: Bariatric rolling walker  Total discharge time: Greater than 30 minutes  Discharge time involved coordination of the discharge process with social worker, nursing staff and therapy department. Medical justification for DME verified.     [facility], NP [employer] [phone]

## 2016-09-12 ENCOUNTER — Emergency Department (HOSPITAL_BASED_OUTPATIENT_CLINIC_OR_DEPARTMENT_OTHER): Payer: 59

## 2016-09-12 ENCOUNTER — Encounter (HOSPITAL_COMMUNITY): Payer: Self-pay | Admitting: Emergency Medicine

## 2016-09-12 ENCOUNTER — Emergency Department (HOSPITAL_COMMUNITY)
Admission: EM | Admit: 2016-09-12 | Discharge: 2016-09-12 | Disposition: A | Discharge status: Home / Self Care | Payer: 59 | Attending: Emergency Medicine | Admitting: Emergency Medicine

## 2016-09-12 DIAGNOSIS — Z85828 Personal history of other malignant neoplasm of skin: Secondary | ICD-10-CM | POA: Insufficient documentation

## 2016-09-12 DIAGNOSIS — M7989 Other specified soft tissue disorders: Secondary | ICD-10-CM

## 2016-09-12 DIAGNOSIS — I10 Essential (primary) hypertension: Secondary | ICD-10-CM | POA: Insufficient documentation

## 2016-09-12 DIAGNOSIS — Z7901 Long term (current) use of anticoagulants: Secondary | ICD-10-CM | POA: Insufficient documentation

## 2016-09-12 DIAGNOSIS — J45909 Unspecified asthma, uncomplicated: Secondary | ICD-10-CM | POA: Diagnosis not present

## 2016-09-12 DIAGNOSIS — M79609 Pain in unspecified limb: Secondary | ICD-10-CM | POA: Diagnosis not present

## 2016-09-12 DIAGNOSIS — Z79899 Other long term (current) drug therapy: Secondary | ICD-10-CM | POA: Diagnosis not present

## 2016-09-12 DIAGNOSIS — L539 Erythematous condition, unspecified: Secondary | ICD-10-CM | POA: Insufficient documentation

## 2016-09-12 DIAGNOSIS — Z96653 Presence of artificial knee joint, bilateral: Secondary | ICD-10-CM | POA: Diagnosis not present

## 2016-09-12 LAB — CBC
HCT: 33.4 % — ABNORMAL LOW (ref 36.0–46.0)
Hemoglobin: 10.5 g/dL — ABNORMAL LOW (ref 12.0–15.0)
MCH: 25.5 pg — ABNORMAL LOW (ref 26.0–34.0)
MCHC: 31.4 g/dL (ref 30.0–36.0)
MCV: 81.3 fL (ref 78.0–100.0)
Platelets: 255 10*3/uL (ref 150–400)
RBC: 4.11 MIL/uL (ref 3.87–5.11)
RDW: 14.6 % (ref 11.5–15.5)
WBC: 5.3 10*3/uL (ref 4.0–10.5)

## 2016-09-12 LAB — BASIC METABOLIC PANEL
Anion gap: 6 (ref 5–15)
BUN: 15 mg/dL (ref 6–20)
CO2: 28 mmol/L (ref 22–32)
Calcium: 8.6 mg/dL — ABNORMAL LOW (ref 8.9–10.3)
Chloride: 104 mmol/L (ref 101–111)
Creatinine, Ser: 0.89 mg/dL (ref 0.44–1.00)
Glucose, Bld: 90 mg/dL (ref 65–99)
Potassium: 3.8 mmol/L (ref 3.5–5.1)
SODIUM: 138 mmol/L (ref 135–145)

## 2016-09-12 MED ORDER — CEPHALEXIN 500 MG PO CAPS: 500.0000 mg | ORAL_CAPSULE | Freq: Four times a day (QID) | ORAL | 0 refills | Status: DC

## 2016-09-12 NOTE — Discharge Instructions (Signed)
Take the antibiotics as prescribed, follow up with your doctor next week to be rechecked, return for fever, vomiting, worsening symptoms

## 2016-09-12 NOTE — Progress Notes (Signed)
VASCULAR LAB PRELIMINARY  PRELIMINARY  PRELIMINARY  PRELIMINARY  Bilateral lower extremity venous duplex completed.    Preliminary report:  There is no DVT or SVT noted in the bilateral lower extremities.    Gave report to Dr. Erenest Blank, [facility], RVT 09/12/2016, 2:41 PM

## 2016-09-12 NOTE — ED Triage Notes (Signed)
Per pt, states lower extremity swelling and redness, more so on there right-states it has moved up right leg-has a history of DVT in right leg-no CP, no SOB

## 2016-09-12 NOTE — ED Provider Notes (Addendum)
[facility] DEPT Provider Note   CSN: [id number] Arrival date & time: 09/12/16  1111     History   Chief Complaint Chief Complaint  Patient presents with  . Leg Swelling    HPI Kendra Haney is a 56 y.o. female.  HPI Patient presents to the emergency room for evaluation of lower extremity redness and swelling. The patient has a history of DVT many years ago. She is no longer taking any anticoagulation. Patient's noticed redness and swelling of primarily her right lower extremity but she is also noticed some in the left leg. Patient states she will often have some trouble with redness in her lower legs especially in the mornings. However today the redness on the right side is increased. She also has some discomfort in the right lower leg. Because of her history of DVT she came into the emergency room for evaluation. She denies any trouble with any chest pain or shortness of breath. No fevers or chills. Past Medical History:  Diagnosis Date  . Anxiety   . Arthritis   . Asthma    greater than 20 years ago   . Cancer ([place])    hx of skin cancer on back   . Complication of anesthesia    Nausea  & vomiting during eye surgery  . Depression   . DVT (deep venous thrombosis) ([place])    12/2011   . Family history of adverse reaction to anesthesia    sister has problems with nausea and vomiting   . GERD (gastroesophageal reflux disease)   . H/O varicella   . Hydradenitis 04/14/11  . Hypertension    hx of hypertension no longer on meds   . Menopausal symptoms 10/14/10  . Menses, irregular 07/02/10  . PONV (postoperative nausea and vomiting)   . Vulvar itching 04/14/11  . Yeast infection     Patient Active Problem List   Diagnosis Date Noted  . OA (osteoarthritis) of knee 10/24/2014  . DVT (deep venous thrombosis) ([place]) 01/19/2012  . PMS (premenstrual syndrome) 11/09/2011  . OBESITY 06/28/2009  . GERD 06/28/2009  . NAUSEA 06/28/2009  . DIARRHEA 06/28/2009  . ABDOMINAL  PAIN-EPIGASTRIC 06/28/2009  . Essential hypertension 06/27/2009    Past Surgical History:  Procedure Laterality Date  . EYE MUSCLE SURGERY     Wandering eye - 56 years old  . TONSILLECTOMY     56 years old  . TOTAL KNEE ARTHROPLASTY Bilateral 10/24/2014   Procedure: TOTAL KNEE BILATERAL;  Surgeon: Gaynelle Arabian, MD;  Location: [facility];  Service: Orthopedics;  Laterality: Bilateral;  . WISDOM TOOTH EXTRACTION      OB History    Gravida Para Term Preterm AB Living   0 0 0 0 0 0   SAB TAB Ectopic Multiple Live Births   0 0 0 0         Home Medications    Prior to Admission medications   Medication Sig Start Date End Date Taking? Authorizing Provider  acetaminophen (TYLENOL) 500 MG tablet Take 650 mg by mouth every 6 (six) hours as needed for mild pain.  01/19/12   Historical Provider, MD  ALPRAZolam Duanne Moron) 1 MG tablet Take 0.5-3 mg by mouth 3 (three) times daily as needed for anxiety.     Historical Provider, MD  bisacodyl (DULCOLAX) 10 MG suppository Place 1 suppository (10 mg total) rectally daily as needed for moderate constipation. 10/29/14   Alexzandrew L Perkins, PA-C  buPROPion (WELLBUTRIN XL) 150 MG 24 hr tablet Take 450  mg by mouth every morning. Pt takes 3 tabs daily.    Historical Provider, MD  busPIRone (BUSPAR) 15 MG tablet Take 15 mg by mouth 3 (three) times daily as needed (DEPRESSION). Patient takes on 15 mg in the am and then as needed the rest of the day.    Historical Provider, MD  cephALEXin (KEFLEX) 500 MG capsule Take 1 capsule (500 mg total) by mouth 4 (four) times daily. 09/12/16   Dorie Rank, MD  docusate sodium (COLACE) 100 MG capsule Take 1 capsule (100 mg total) by mouth 2 (two) times daily. 10/29/14   Alexzandrew L Perkins, PA-C  fexofenadine (ALLEGRA) 180 MG tablet Take 180 mg by mouth daily.    Historical Provider, MD  methocarbamol (ROBAXIN) 500 MG tablet Take 1 tablet (500 mg total) by mouth every 6 (six) hours as needed for muscle spasms. 10/29/14   Alexzandrew  L Perkins, PA-C  metoCLOPramide (REGLAN) 5 MG tablet Take 1-2 tablets (5-10 mg total) by mouth every 8 (eight) hours as needed for nausea (if ondansetron (ZOFRAN) ineffective.). 10/29/14   Alexzandrew L Perkins, PA-C  NEXIUM 40 MG capsule TAKE 1 CAPSULE DAILY Patient taking differently: patient takes 20 mg daily 05/13/11   Lafayette Dragon, MD  nitrofurantoin (MACRODANTIN) 100 MG capsule Take 100 mg by mouth 4 (four) times daily. Patient completed on 10/15/2014.    Historical Provider, MD  ondansetron (ZOFRAN) 4 MG tablet Take 1 tablet (4 mg total) by mouth every 6 (six) hours as needed for nausea. 10/29/14   Alexzandrew L Perkins, PA-C  oxyCODONE (OXY IR/ROXICODONE) 5 MG immediate release tablet Take 1-4 tablets (5-20 mg total) by mouth every 3 (three) hours as needed for moderate pain, severe pain or breakthrough pain. 10/29/14   Alexzandrew L Perkins, PA-C  polyethylene glycol (MIRALAX / GLYCOLAX) packet Take 17 g by mouth daily as needed for mild constipation. 10/29/14   Alexzandrew L Perkins, PA-C  traZODone (DESYREL) 100 MG tablet Take 100 mg by mouth at bedtime as needed for sleep.     Historical Provider, MD  triamcinolone cream (KENALOG) 0.1 % Apply 1 application topically 2 (two) times daily. As needed    Historical Provider, MD  warfarin (COUMADIN) 2 MG tablet Take 1 tablet (2 mg total) by mouth daily. Take Coumadin for four weeks and then discontinue.  The dose may need to be adjusted based upon the INR.  Please follow the INR and titrate Coumadin dose for a therapeutic range between 2.0 and 3.0 INR.  After completing the four weeks of Coumadin, the patient may stop the Coumadin and take 81 mg Aspirin daily for three more weeks. 10/29/14   Alexzandrew Monika Salk, PA-C    Family History Family History  Problem Relation Age of Onset  . Hypertension Paternal Grandmother   . Cancer Father   . Hypertension Mother     Social History Social History  Substance Use Topics  . Smoking status: Never  Smoker  . Smokeless tobacco: Never Used  . Alcohol use No     Allergies   Doxycycline; Dust mite extract; Mold extract [trichophyton mentagrophyte]; Nsaids; and Tetracyclines & related   Review of Systems Review of Systems  All other systems reviewed and are negative.    Physical Exam Updated Vital Signs BP 149/94 (BP Location: Right Wrist)   Pulse 76   Temp 98.2 F (36.8 C)   Resp 15   Ht 5\' 10"  (1.778 m)   Wt (!) 156.5 kg   SpO2 100%  BMI 49.50 kg/m   Physical Exam  Constitutional: No distress.  Obese   HENT:  Head: Normocephalic and atraumatic.  Right Ear: External ear normal.  Left Ear: External ear normal.  Eyes: Conjunctivae are normal. Right eye exhibits no discharge. Left eye exhibits no discharge. No scleral icterus.  Neck: Neck supple. No tracheal deviation present.  Cardiovascular: Normal rate, regular rhythm and intact distal pulses.   Pulmonary/Chest: Effort normal and breath sounds normal. No stridor. No respiratory distress. She has no wheezes. She has no rales.  Abdominal: Soft. Bowel sounds are normal. She exhibits no distension. There is no tenderness. There is no rebound and no guarding.  Musculoskeletal: She exhibits edema and tenderness.  Erythema right lower extrem greater than left, ttp calf on the right, no lymphangitic streaking  Neurological: She is alert. She has normal strength. No cranial nerve deficit (no facial droop, extraocular movements intact, no slurred speech) or sensory deficit. She exhibits normal muscle tone. She displays no seizure activity. Coordination normal.  Skin: Skin is warm and dry. No rash noted. There is erythema.  Psychiatric: She has a normal mood and affect.  Nursing note and vitals reviewed.    ED Treatments / Results  Labs (all labs ordered are listed, but only abnormal results are displayed) Labs Reviewed  CBC - Abnormal; Notable for the following:       Result Value   Hemoglobin 10.5 (*)    HCT 33.4  (*)    MCH 25.5 (*)    All other components within normal limits  BASIC METABOLIC PANEL - Abnormal; Notable for the following:    Calcium 8.6 (*)    All other components within normal limits  Hgb increased from 1 yr ago.  BMET normal   Radiology No results found.  Procedures Procedures (including critical care time)  Medications Ordered in ED Medications - No data to display   Initial Impression / Assessment and Plan / ED Course  I have reviewed the triage vital signs and the nursing notes.  Pertinent labs & imaging results that were available during my care of the patient were reviewed by me and considered in my medical decision making (see chart for details).   No DVT on [place].   Erythema noted in bilateral LE but worse on the right.  ? Venous stasis dermatitis vs cellulitis.  Rx course of keflex.  Follow up with pcp  Final Clinical Impressions(s) / ED Diagnoses   Final diagnoses:  Leg swelling  Leg erythema    New Prescriptions Discharge Medication List as of 09/12/2016  4:03 PM    START taking these medications   Details  cephALEXin (KEFLEX) 500 MG capsule Take 1 capsule (500 mg total) by mouth 4 (four) times daily., Starting Sat 09/12/2016, Print           Dorie Rank, MD 09/13/16 [phone]

## 2016-09-24 DIAGNOSIS — R7309 Other abnormal glucose: Secondary | ICD-10-CM | POA: Diagnosis not present

## 2016-09-24 DIAGNOSIS — L539 Erythematous condition, unspecified: Secondary | ICD-10-CM | POA: Diagnosis not present

## 2016-10-08 DIAGNOSIS — Z96651 Presence of right artificial knee joint: Secondary | ICD-10-CM | POA: Diagnosis not present

## 2016-10-08 DIAGNOSIS — Z96653 Presence of artificial knee joint, bilateral: Secondary | ICD-10-CM | POA: Diagnosis not present

## 2016-10-08 DIAGNOSIS — Z471 Aftercare following joint replacement surgery: Secondary | ICD-10-CM | POA: Diagnosis not present

## 2016-12-02 DIAGNOSIS — M7542 Impingement syndrome of left shoulder: Secondary | ICD-10-CM | POA: Diagnosis not present

## 2016-12-14 DIAGNOSIS — M7542 Impingement syndrome of left shoulder: Secondary | ICD-10-CM | POA: Diagnosis not present

## 2016-12-18 DIAGNOSIS — M2042 Other hammer toe(s) (acquired), left foot: Secondary | ICD-10-CM | POA: Diagnosis not present

## 2016-12-18 DIAGNOSIS — L851 Acquired keratosis [keratoderma] palmaris et plantaris: Secondary | ICD-10-CM | POA: Diagnosis not present

## 2016-12-31 DIAGNOSIS — M7542 Impingement syndrome of left shoulder: Secondary | ICD-10-CM | POA: Diagnosis not present

## 2017-01-04 DIAGNOSIS — H5213 Myopia, bilateral: Secondary | ICD-10-CM | POA: Diagnosis not present

## 2017-01-04 DIAGNOSIS — H524 Presbyopia: Secondary | ICD-10-CM | POA: Diagnosis not present

## 2017-01-04 DIAGNOSIS — H52223 Regular astigmatism, bilateral: Secondary | ICD-10-CM | POA: Diagnosis not present

## 2017-05-20 ENCOUNTER — Other Ambulatory Visit: Payer: Self-pay | Admitting: Internal Medicine

## 2017-05-20 DIAGNOSIS — Z1231 Encounter for screening mammogram for malignant neoplasm of breast: Secondary | ICD-10-CM

## 2017-05-21 DIAGNOSIS — I10 Essential (primary) hypertension: Secondary | ICD-10-CM | POA: Diagnosis not present

## 2017-05-21 DIAGNOSIS — R7309 Other abnormal glucose: Secondary | ICD-10-CM | POA: Diagnosis not present

## 2017-05-21 DIAGNOSIS — E782 Mixed hyperlipidemia: Secondary | ICD-10-CM | POA: Diagnosis not present

## 2017-05-21 DIAGNOSIS — R5383 Other fatigue: Secondary | ICD-10-CM | POA: Diagnosis not present

## 2017-06-01 DIAGNOSIS — D519 Vitamin B12 deficiency anemia, unspecified: Secondary | ICD-10-CM | POA: Diagnosis not present

## 2017-06-11 DIAGNOSIS — D519 Vitamin B12 deficiency anemia, unspecified: Secondary | ICD-10-CM | POA: Diagnosis not present

## 2017-06-17 ENCOUNTER — Ambulatory Visit
Admission: RE | Admit: 2017-06-17 | Discharge: 2017-06-17 | Disposition: A | Discharge status: Home / Self Care | Payer: 59 | Source: Ambulatory Visit | Attending: Internal Medicine | Admitting: Internal Medicine

## 2017-06-17 DIAGNOSIS — Z1231 Encounter for screening mammogram for malignant neoplasm of breast: Secondary | ICD-10-CM

## 2017-06-18 DIAGNOSIS — D519 Vitamin B12 deficiency anemia, unspecified: Secondary | ICD-10-CM | POA: Diagnosis not present

## 2017-06-30 DIAGNOSIS — Z1211 Encounter for screening for malignant neoplasm of colon: Secondary | ICD-10-CM | POA: Diagnosis not present

## 2017-06-30 DIAGNOSIS — D519 Vitamin B12 deficiency anemia, unspecified: Secondary | ICD-10-CM | POA: Diagnosis not present

## 2017-06-30 DIAGNOSIS — E782 Mixed hyperlipidemia: Secondary | ICD-10-CM | POA: Diagnosis not present

## 2017-06-30 DIAGNOSIS — Z0000 Encounter for general adult medical examination without abnormal findings: Secondary | ICD-10-CM | POA: Diagnosis not present

## 2017-06-30 DIAGNOSIS — R05 Cough: Secondary | ICD-10-CM | POA: Diagnosis not present

## 2017-06-30 DIAGNOSIS — B372 Candidiasis of skin and nail: Secondary | ICD-10-CM | POA: Diagnosis not present

## 2017-07-22 DIAGNOSIS — E539 Vitamin B deficiency, unspecified: Secondary | ICD-10-CM | POA: Diagnosis not present

## 2017-07-22 DIAGNOSIS — J209 Acute bronchitis, unspecified: Secondary | ICD-10-CM | POA: Diagnosis not present

## 2017-08-27 DIAGNOSIS — D519 Vitamin B12 deficiency anemia, unspecified: Secondary | ICD-10-CM | POA: Diagnosis not present

## 2018-04-04 DIAGNOSIS — J309 Allergic rhinitis, unspecified: Secondary | ICD-10-CM

## 2018-04-04 DIAGNOSIS — R600 Localized edema: Secondary | ICD-10-CM

## 2018-04-04 DIAGNOSIS — Z6841 Body Mass Index (BMI) 40.0 and over, adult: Secondary | ICD-10-CM

## 2018-04-04 DIAGNOSIS — Z1331 Encounter for screening for depression: Secondary | ICD-10-CM

## 2018-04-06 DIAGNOSIS — Z23 Encounter for immunization: Secondary | ICD-10-CM | POA: Diagnosis not present

## 2018-05-02 ENCOUNTER — Ambulatory Visit: Payer: 59 | Admitting: Nurse Practitioner

## 2018-05-23 ENCOUNTER — Ambulatory Visit: Payer: 59 | Admitting: Nurse Practitioner

## 2018-05-23 ENCOUNTER — Encounter: Payer: Self-pay | Admitting: Nurse Practitioner

## 2018-05-23 VITALS — BP 138/84 | HR 61 | Temp 98.3°F | Ht 70.0 in | Wt 399.8 lb

## 2018-05-23 DIAGNOSIS — R609 Edema, unspecified: Secondary | ICD-10-CM

## 2018-05-23 DIAGNOSIS — B351 Tinea unguium: Secondary | ICD-10-CM

## 2018-05-23 MED ORDER — TERBINAFINE HCL 250 MG PO TABS: 250.0000 mg | ORAL_TABLET | Freq: Every day | ORAL | 0 refills | Status: AC

## 2018-05-23 NOTE — Patient Instructions (Signed)

## 2018-05-23 NOTE — Progress Notes (Signed)
Subjective:     Patient ID: Kendra Haney , female    DOB: Sep 19, 1960 , 57 y.o.   MRN: [id number]   Chief Complaint  Patient presents with  . Edema    swelling in her legs    HPI  Edema - She reports her swelling is better.  She is on HCTZ 12.5 mg daily.  Drinks approximately 90 oz water daily.   Right great toe discoloration to 2nd toenail.     Past Medical History:  Diagnosis Date  . Anxiety   . Arthritis   . Asthma    greater than 20 years ago   . Cancer ([place])    hx of skin cancer on back   . Complication of anesthesia    Nausea  & vomiting during eye surgery  . Depression   . DVT (deep venous thrombosis) ([place])    12/2011   . Family history of adverse reaction to anesthesia    sister has problems with nausea and vomiting   . GERD (gastroesophageal reflux disease)   . H/O varicella   . Hydradenitis 04/14/11  . Hypertension    hx of hypertension no longer on meds   . Menopausal symptoms 10/14/10  . Menses, irregular 07/02/10  . PONV (postoperative nausea and vomiting)   . Vulvar itching 04/14/11  . Yeast infection      Family History  Problem Relation Age of Onset  . Hypertension Paternal Grandmother   . Cancer Father   . Hypertension Mother   . Breast cancer Neg Hx      Current Outpatient Medications:  .  acetaminophen (TYLENOL) 500 MG tablet, Take 650 mg by mouth every 6 (six) hours as needed for mild pain. , Disp: , Rfl:  .  ALPRAZolam (XANAX) 1 MG tablet, Take 0.5-3 mg by mouth 3 (three) times daily as needed for anxiety. , Disp: , Rfl:  .  buPROPion (WELLBUTRIN XL) 150 MG 24 hr tablet, Take 450 mg by mouth every morning. Pt takes 3 tabs daily., Disp: , Rfl:  .  busPIRone (BUSPAR) 15 MG tablet, Take 15 mg by mouth 3 (three) times daily as needed (DEPRESSION). Patient takes on 15 mg in the am and then as needed the rest of the day., Disp: , Rfl:  .  fexofenadine (ALLEGRA) 180 MG tablet, Take 180 mg by mouth daily., Disp: , Rfl:  .  NEXIUM 40 MG capsule, TAKE  1 CAPSULE DAILY (Patient taking differently: patient takes 20 mg daily), Disp: 90 capsule, Rfl: 1 .  traZODone (DESYREL) 100 MG tablet, Take 100 mg by mouth at bedtime as needed for sleep. , Disp: , Rfl:  .  vortioxetine HBr (TRINTELLIX) 20 MG TABS tablet, Take 20 mg by mouth daily., Disp: , Rfl:    Allergies  Allergen Reactions  . Doxycycline Nausea And Vomiting  . Dust Mite Extract     UNKNOWN  . Mold Extract [Trichophyton Mentagrophyte]     UNKNOWN  . Nsaids Other (See Comments)    GI UPSET   . Tetracyclines & Related Nausea And Vomiting     Review of Systems  Respiratory: Negative.  Negative for shortness of breath.   Cardiovascular: Negative for chest pain, palpitations and leg swelling.  Genitourinary: Negative.   Musculoskeletal: Negative.   Skin:       Right 2nd toe with fungus.       Today's Vitals   05/23/18 0926  BP: 138/84  Pulse: 61  Temp: 98.3 F (36.8 C)  TempSrc: Oral  SpO2: 98%  Weight: (!) 399 lb 12.8 oz (181.3 kg)  Height: 5\' 10"  (1.778 m)  PainSc: 0-No pain   Body mass index is 57.37 kg/m.   Objective:  Physical Exam  Constitutional: She appears well-developed and well-nourished.  Cardiovascular: Normal rate, regular rhythm, normal heart sounds and normal pulses.  No edema present to bilateral lower extremities.   Pulmonary/Chest: Effort normal and breath sounds normal.        Assessment And Plan:      1. Edema, unspecified type  Improved with HCTZ  Encouraged to wear compression socks during the day  2. Onychomycosis  Will restart terbinafine 250 mg daily  Will check liver functions next month and again in 3 months.  - terbinafine (LAMISIL) 250 MG tablet; Take 1 tablet (250 mg total) by mouth daily.  Dispense: 90 tablet; Refill: 0    Minette Brine, FNP

## 2018-06-30 ENCOUNTER — Other Ambulatory Visit: Payer: Self-pay | Admitting: Nurse Practitioner

## 2018-07-01 ENCOUNTER — Ambulatory Visit: Payer: 59 | Admitting: Nurse Practitioner

## 2018-07-01 ENCOUNTER — Encounter: Payer: Self-pay | Admitting: Nurse Practitioner

## 2018-07-01 VITALS — BP 140/90 | HR 65 | Temp 98.3°F | Ht 67.2 in | Wt >= 6400 oz

## 2018-07-01 DIAGNOSIS — Z0000 Encounter for general adult medical examination without abnormal findings: Secondary | ICD-10-CM | POA: Diagnosis not present

## 2018-07-01 DIAGNOSIS — Z1211 Encounter for screening for malignant neoplasm of colon: Secondary | ICD-10-CM

## 2018-07-01 DIAGNOSIS — R059 Cough, unspecified: Secondary | ICD-10-CM | POA: Insufficient documentation

## 2018-07-01 DIAGNOSIS — Z6841 Body Mass Index (BMI) 40.0 and over, adult: Secondary | ICD-10-CM

## 2018-07-01 DIAGNOSIS — I10 Essential (primary) hypertension: Secondary | ICD-10-CM | POA: Diagnosis not present

## 2018-07-01 DIAGNOSIS — Z1231 Encounter for screening mammogram for malignant neoplasm of breast: Secondary | ICD-10-CM | POA: Diagnosis not present

## 2018-07-01 DIAGNOSIS — R05 Cough: Secondary | ICD-10-CM

## 2018-07-01 DIAGNOSIS — E559 Vitamin D deficiency, unspecified: Secondary | ICD-10-CM

## 2018-07-01 DIAGNOSIS — J3089 Other allergic rhinitis: Secondary | ICD-10-CM | POA: Insufficient documentation

## 2018-07-01 LAB — POCT URINALYSIS DIPSTICK
Bilirubin, UA: NEGATIVE
Glucose, UA: NEGATIVE
Ketones, UA: NEGATIVE
Leukocytes, UA: NEGATIVE
Nitrite, UA: NEGATIVE
Protein, UA: NEGATIVE
Spec Grav, UA: 1.025 (ref 1.010–1.025)
Urobilinogen, UA: 0.2 E.U./dL
pH, UA: 6 (ref 5.0–8.0)

## 2018-07-01 LAB — POCT UA - MICROALBUMIN
Creatinine, POC: 300 mg/dL
Microalbumin Ur, POC: 80 mg/L

## 2018-07-01 MED ORDER — TRIAMCINOLONE ACETONIDE 40 MG/ML IJ SUSP: 60.0000 mg | Freq: Once | INTRAMUSCULAR | Status: DC

## 2018-07-01 MED ORDER — ALBUTEROL SULFATE HFA 108 (90 BASE) MCG/ACT IN AERS: 2.0000 | INHALATION_SPRAY | Freq: Once | RESPIRATORY_TRACT | Status: DC

## 2018-07-01 NOTE — Patient Instructions (Signed)
Health Maintenance, Female Adopting a healthy lifestyle and getting preventive care can go a long way to promote health and wellness. Talk with your health care provider about what schedule of regular examinations is right for you. This is a good chance for you to check in with your provider about disease prevention and staying healthy. In between checkups, there are plenty of things you can do on your own. Experts have done a lot of research about which lifestyle changes and preventive measures are most likely to keep you healthy. Ask your health care provider for more information. Weight and diet Eat a healthy diet  Be sure to include plenty of vegetables, fruits, low-fat dairy products, and lean protein.  Do not eat a lot of foods high in solid fats, added sugars, or salt.  Get regular exercise. This is one of the most important things you can do for your health. ? Most adults should exercise for at least 150 minutes each week. The exercise should increase your heart rate and make you sweat (moderate-intensity exercise). ? Most adults should also do strengthening exercises at least twice a week. This is in addition to the moderate-intensity exercise. Maintain a healthy weight  Body mass index (BMI) is a measurement that can be used to identify possible weight problems. It estimates body fat based on height and weight. Your health care provider can help determine your BMI and help you achieve or maintain a healthy weight.  For females 5 years of age and older: ? A BMI below 18.5 is considered underweight. ? A BMI of 18.5 to 24.9 is normal. ? A BMI of 25 to 29.9 is considered overweight. ? A BMI of 30 and above is considered obese. Watch levels of cholesterol and blood lipids  You should start having your blood tested for lipids and cholesterol at 55 years of age, then have this test every 5 years.  You may need to have your cholesterol levels checked more often if: ? Your lipid or  cholesterol levels are high. ? You are older than 55 years of age. ? You are at high risk for heart disease. Cancer screening Lung Cancer  Lung cancer screening is recommended for adults 48-79 years old who are at high risk for lung cancer because of a history of smoking.  A yearly low-dose CT scan of the lungs is recommended for people who: ? Currently smoke. ? Have quit within the past 15 years. ? Have at least a 30-pack-year history of smoking. A pack year is smoking an average of one pack of cigarettes a day for 1 year.  Yearly screening should continue until it has been 15 years since you quit.  Yearly screening should stop if you develop a health problem that would prevent you from having lung cancer treatment. Breast Cancer  Practice breast self-awareness. This means understanding how your breasts normally appear and feel.  It also means doing regular breast self-exams. Let your health care provider know about any changes, no matter how small.  If you are in your 20s or 30s, you should have a clinical breast exam (CBE) by a health care provider every 1-3 years as part of a regular health exam.  If you are 22 or older, have a CBE every year. Also consider having a breast X-ray (mammogram) every year.  If you have a family history of breast cancer, talk to your health care provider about genetic screening.  If you are at high risk for breast cancer, talk  to your health care provider about having an MRI and a mammogram every year.  Breast cancer gene (BRCA) assessment is recommended for women who have family members with BRCA-related cancers. BRCA-related cancers include: ? Breast. ? Ovarian. ? Tubal. ? Peritoneal cancers.  Results of the assessment will determine the need for genetic counseling and BRCA1 and BRCA2 testing. Cervical Cancer Your health care provider may recommend that you be screened regularly for cancer of the pelvic organs (ovaries, uterus, and vagina).  This screening involves a pelvic examination, including checking for microscopic changes to the surface of your cervix (Pap test). You may be encouraged to have this screening done every 3 years, beginning at age 21.  For women ages 30-65, health care providers may recommend pelvic exams and Pap testing every 3 years, or they may recommend the Pap and pelvic exam, combined with testing for human papilloma virus (HPV), every 5 years. Some types of HPV increase your risk of cervical cancer. Testing for HPV may also be done on women of any age with unclear Pap test results.  Other health care providers may not recommend any screening for nonpregnant women who are considered low risk for pelvic cancer and who do not have symptoms. Ask your health care provider if a screening pelvic exam is right for you.  If you have had past treatment for cervical cancer or a condition that could lead to cancer, you need Pap tests and screening for cancer for at least 20 years after your treatment. If Pap tests have been discontinued, your risk factors (such as having a new sexual partner) need to be reassessed to determine if screening should resume. Some women have medical problems that increase the chance of getting cervical cancer. In these cases, your health care provider may recommend more frequent screening and Pap tests. Colorectal Cancer  This type of cancer can be detected and often prevented.  Routine colorectal cancer screening usually begins at 55 years of age and continues through 55 years of age.  Your health care provider may recommend screening at an earlier age if you have risk factors for colon cancer.  Your health care provider may also recommend using home test kits to check for hidden blood in the stool.  A small camera at the end of a tube can be used to examine your colon directly (sigmoidoscopy or colonoscopy). This is done to check for the earliest forms of colorectal cancer.  Routine  screening usually begins at age 50.  Direct examination of the colon should be repeated every 5-10 years through 55 years of age. However, you may need to be screened more often if early forms of precancerous polyps or small growths are found. Skin Cancer  Check your skin from head to toe regularly.  Tell your health care provider about any new moles or changes in moles, especially if there is a change in a mole's shape or color.  Also tell your health care provider if you have a mole that is larger than the size of a pencil eraser.  Always use sunscreen. Apply sunscreen liberally and repeatedly throughout the day.  Protect yourself by wearing long sleeves, pants, a wide-brimmed hat, and sunglasses whenever you are outside. Heart disease, diabetes, and high blood pressure  High blood pressure causes heart disease and increases the risk of stroke. High blood pressure is more likely to develop in: ? People who have blood pressure in the high end of the normal range (130-139/85-89 mm Hg). ? People   who are overweight or obese. ? People who are African American.  If you are 84-22 years of age, have your blood pressure checked every 3-5 years. If you are 67 years of age or older, have your blood pressure checked every year. You should have your blood pressure measured twice-once when you are at a hospital or clinic, and once when you are not at a hospital or clinic. Record the average of the two measurements. To check your blood pressure when you are not at a hospital or clinic, you can use: ? An automated blood pressure machine at a pharmacy. ? A home blood pressure monitor.  If you are between 52 years and 3 years old, ask your health care provider if you should take aspirin to prevent strokes.  Have regular diabetes screenings. This involves taking a blood sample to check your fasting blood sugar level. ? If you are at a normal weight and have a low risk for diabetes, have this test once  every three years after 55 years of age. ? If you are overweight and have a high risk for diabetes, consider being tested at a younger age or more often. Preventing infection Hepatitis B  If you have a higher risk for hepatitis B, you should be screened for this virus. You are considered at high risk for hepatitis B if: ? You were born in a country where hepatitis B is common. Ask your health care provider which countries are considered high risk. ? Your parents were born in a high-risk country, and you have not been immunized against hepatitis B (hepatitis B vaccine). ? You have HIV or AIDS. ? You use needles to inject street drugs. ? You live with someone who has hepatitis B. ? You have had sex with someone who has hepatitis B. ? You get hemodialysis treatment. ? You take certain medicines for conditions, including cancer, organ transplantation, and autoimmune conditions. Hepatitis C  Blood testing is recommended for: ? Everyone born from [birth year] through 1963. ? Anyone with known risk factors for hepatitis C. Sexually transmitted infections (STIs)  You should be screened for sexually transmitted infections (STIs) including gonorrhea and chlamydia if: ? You are sexually active and are younger than 55 years of age. ? You are older than 55 years of age and your health care provider tells you that you are at risk for this type of infection. ? Your sexual activity has changed since you were last screened and you are at an increased risk for chlamydia or gonorrhea. Ask your health care provider if you are at risk.  If you do not have HIV, but are at risk, it may be recommended that you take a prescription medicine daily to prevent HIV infection. This is called pre-exposure prophylaxis (PrEP). You are considered at risk if: ? You are sexually active and do not regularly use condoms or know the HIV status of your partner(s). ? You take drugs by injection. ? You are sexually active with a partner  who has HIV. Talk with your health care provider about whether you are at high risk of being infected with HIV. If you choose to begin PrEP, you should first be tested for HIV. You should then be tested every 3 months for as long as you are taking PrEP. Pregnancy  If you are premenopausal and you may become pregnant, ask your health care provider about preconception counseling.  If you may become pregnant, take 400 to 800 micrograms (mcg) of folic acid every  day.  If you want to prevent pregnancy, talk to your health care provider about birth control (contraception). Osteoporosis and menopause  Osteoporosis is a disease in which the bones lose minerals and strength with aging. This can result in serious bone fractures. Your risk for osteoporosis can be identified using a bone density scan.  If you are 72 years of age or older, or if you are at risk for osteoporosis and fractures, ask your health care provider if you should be screened.  Ask your health care provider whether you should take a calcium or vitamin D supplement to lower your risk for osteoporosis.  Menopause may have certain physical symptoms and risks.  Hormone replacement therapy may reduce some of these symptoms and risks. Talk to your health care provider about whether hormone replacement therapy is right for you. Follow these instructions at home:  Schedule regular health, dental, and eye exams.  Stay current with your immunizations.  Do not use any tobacco products including cigarettes, chewing tobacco, or electronic cigarettes.  If you are pregnant, do not drink alcohol.  If you are breastfeeding, limit how much and how often you drink alcohol.  Limit alcohol intake to no more than 1 drink per day for nonpregnant women. One drink equals 12 ounces of beer, 5 ounces of wine, or 1 ounces of hard liquor.  Do not use street drugs.  Do not share needles.  Ask your health care provider for help if you need support  or information about quitting drugs.  Tell your health care provider if you often feel depressed.  Tell your health care provider if you have ever been abused or do not feel safe at home. This information is not intended to replace advice given to you by your health care provider. Make sure you discuss any questions you have with your health care provider. Document Released: 01/12/2011 Document Revised: 12/05/2015 Document Reviewed: 04/02/2015 Elsevier Interactive Patient Education  2019 [employer].   Colonoscopy, Adult A colonoscopy is an exam to look at the entire large intestine. During the exam, a lubricated, flexible tube that has a camera on the end of it is inserted into the anus and then passed into the rectum, colon, and other parts of the large intestine. You may have a colonoscopy as a part of normal colorectal screening or if you have certain symptoms, such as:  Lack of red blood cells (anemia).  Diarrhea that does not go away.  Abdominal pain.  Blood in your stool (feces). A colonoscopy can help screen for and diagnose medical problems, including:  Tumors.  Polyps.  Inflammation.  Areas of bleeding. Tell a health care provider about:  Any allergies you have.  All medicines you are taking, including vitamins, herbs, eye drops, creams, and over-the-counter medicines.  Any problems you or family members have had with anesthetic medicines.  Any blood disorders you have.  Any surgeries you have had.  Any medical conditions you have.  Any problems you have had passing stool. What are the risks? Generally, this is a safe procedure. However, problems may occur, including:  Bleeding.  A tear in the intestine.  A reaction to medicines given during the exam.  Infection (rare). What happens before the procedure? Eating and drinking restrictions Follow instructions from your health care provider about eating and drinking, which may include:  A few days  before the procedure - follow a low-fiber diet. Avoid nuts, seeds, dried fruit, raw fruits, and vegetables.  1-3 days before the  procedure - follow a clear liquid diet. Drink only clear liquids, such as clear broth or bouillon, black coffee or tea, clear juice, clear soft drinks or sports drinks, gelatin dessert, and popsicles. Avoid any liquids that contain red or purple dye.  On the day of the procedure - do not eat or drink anything starting 2 hours before the procedure, or within the time period that your health care provider recommends. Up to 2 hours before the procedure, you may continue to drink clear liquids, such as water or clear fruit juice. Bowel prep If you were prescribed an oral bowel prep to clean out your colon:  Take it as told by your health care provider. Starting the day before your procedure, you will need to drink a large amount of medicated liquid. The liquid will cause you to have multiple loose stools until your stool is almost clear or light green.  If your skin or anus gets irritated from diarrhea, you may use these to relieve the irritation: ? Medicated wipes, such as adult wet wipes with aloe and vitamin E. ? A skin-soothing product like petroleum jelly.  If you vomit while drinking the bowel prep, take a break for up to 60 minutes and then begin the bowel prep again. If vomiting continues and you cannot take the bowel prep without vomiting, call your health care provider.  To clean out your colon, you may also be given: ? Laxative medicines. ? Instructions about how to use an enema. General instructions  Ask your health care provider about: ? Changing or stopping your regular medicines or supplements. This is especially important if you are taking iron supplements, diabetes medicines, or blood thinners. ? Taking medicines such as aspirin and ibuprofen. These medicines can thin your blood. Do not take these medicines before the procedure if your health care  provider tells you not to.  Plan to have someone take you home from the hospital or clinic. What happens during the procedure?   An IV may be inserted into one of your veins.  You will be given medicine to help you relax (sedative).  To reduce your risk of infection: ? Your health care team will wash or sanitize their hands. ? Your anal area will be washed with soap.  You will be asked to lie on your side with your knees bent.  Your health care provider will lubricate a long, thin, flexible tube. The tube will have a camera and a light on the end.  The tube will be inserted into your anus.  The tube will be gently eased through your rectum and colon.  Air will be delivered into your colon to keep it open. You may feel some pressure or cramping.  The camera will be used to take images during the procedure.  A small tissue sample may be removed to be examined under a microscope (biopsy).  If small polyps are found, your health care provider may remove them and have them checked for cancer cells.  When the exam is done, the tube will be removed. The procedure may vary among health care providers and hospitals. What happens after the procedure?  Your blood pressure, heart rate, breathing rate, and blood oxygen level will be monitored until the medicines you were given have worn off.  Do not drive for 24 hours after the exam.  You may have a small amount of blood in your stool.  You may pass gas and have mild abdominal cramping or bloating due to  the air that was used to inflate your colon during the exam.  It is up to you to get the results of your procedure. Ask your health care provider, or the department performing the procedure, when your results will be ready. Summary  A colonoscopy is an exam to look at the entire large intestine.  During a colonoscopy, a lubricated, flexible tube with a camera on the end of it is inserted into the anus and then passed into the colon  and other parts of the large intestine.  Follow instructions from your health care provider about eating and drinking before the procedure.  If you were prescribed an oral bowel prep to clean out your colon, take it as told by your health care provider.  After your procedure, your blood pressure, heart rate, breathing rate, and blood oxygen level will be monitored until the medicines you were given have worn off. This information is not intended to replace advice given to you by your health care provider. Make sure you discuss any questions you have with your health care provider. Document Released: 06/26/2000 Document Revised: 04/21/2017 Document Reviewed: 09/10/2015 Elsevier Interactive Patient Education  2019 [place] and Cholesterol Restricted Eating Plan Eating a diet that limits fat and cholesterol may help lower your risk for heart disease and other conditions. Your body needs fat and cholesterol for basic functions, but eating too much of these things can be harmful to your health. Your health care provider may order lab tests to check your blood fat (lipid) and cholesterol levels. This helps your health care provider understand your risk for certain conditions and whether you need to make diet changes. Work with your health care provider or dietitian to make an eating plan that is right for you. Your plan includes:  Limit your fat intake to ______% or less of your total calories a day.  Limit your saturated fat intake to ______% or less of your total calories a day.  Limit the amount of cholesterol in your diet to less than _________mg a day.  Eat ___________ g of fiber a day. What are tips for following this plan? General guidelines   If you are overweight, work with your health care provider to lose weight safely. Losing just 5-10% of your body weight can improve your overall health and help prevent diseases such as diabetes and heart disease.  Avoid: ? Foods  with added sugar. ? Fried foods. ? Foods that contain partially hydrogenated oils, including stick margarine, some tub margarines, cookies, crackers, and other baked goods.  Limit alcohol intake to no more than 1 drink a day for nonpregnant women and 2 drinks a day for men. One drink equals 12 oz of beer, 5 oz of wine, or 1 oz of hard liquor. Reading food labels  Check food labels for: ? Trans fats, partially hydrogenated oils, or high amounts of saturated fat. Avoid foods that contain saturated fat and trans fat. ? The amount of cholesterol in each serving. Try to eat no more than 200 mg of cholesterol each day. ? The amount of fiber in each serving. Try to eat at least 20-30 g of fiber each day.  Choose foods with healthy fats, such as: ? Monounsaturated and polyunsaturated fats. These include olive and canola oil, flaxseeds, walnuts, almonds, and seeds. ? Omega-3 fats. These are found in foods such as salmon, mackerel, sardines, tuna, flaxseed oil, and ground flaxseeds.  Choose grain products that have whole grains. Look for  the word "whole" as the first word in the ingredient list. Cooking  Cook foods using methods other than frying. Baking, boiling, grilling, and broiling are some healthy options.  Eat more home-cooked food and less restaurant, buffet, and fast food.  Avoid cooking using saturated fats. ? Animal sources of saturated fats include meats, butter, and cream. ? Plant sources of saturated fats include palm oil, palm kernel oil, and coconut oil. Meal planning   At meals, imagine dividing your plate into fourths: ? Fill one-half of your plate with vegetables and green salads. ? Fill one-fourth of your plate with whole grains. ? Fill one-fourth of your plate with lean protein foods.  Eat fish that is high in omega-3 fats at least two times a week.  Eat more foods that contain fiber, such as whole grains, beans, apples, broccoli, carrots, peas, and barley. These foods  help promote healthy cholesterol levels in the blood. Recommended foods Grains  Whole grains, such as whole wheat or whole grain breads, crackers, cereals, and pasta. Unsweetened oatmeal, bulgur, barley, quinoa, or brown rice. Corn or whole wheat flour tortillas. Vegetables  Fresh or frozen vegetables (raw, steamed, roasted, or grilled). Green salads. Fruits  All fresh, canned (in natural juice), or frozen fruits. Meats and other protein foods  Ground beef (85% or leaner), grass-fed beef, or beef trimmed of fat. Skinless chicken or [place]. Ground chicken or [place]. Pork trimmed of fat. All fish and seafood. Egg whites. Dried beans, peas, or lentils. Unsalted nuts or seeds. Unsalted canned beans. Natural nut butters without added sugar and oil. Dairy  Low-fat or nonfat dairy products, such as skim or 1% milk, 2% or reduced-fat cheeses, low-fat and fat-free ricotta or cottage cheese, or plain low-fat and nonfat yogurt. Fats and oils  Tub margarine without trans fats. Light or reduced-fat mayonnaise and salad dressings. Avocado. Olive, canola, sesame, or safflower oils. The items listed above may not be a complete list of recommended foods or beverages. Contact your dietitian for more options. Foods to avoid Grains  White bread. White pasta. White rice. Cornbread. Bagels, pastries, and croissants. Crackers and snack foods that contain trans fat and hydrogenated oils. Vegetables  Vegetables cooked in cheese, cream, or butter sauce. Fried vegetables. Fruits  Canned fruit in heavy syrup. Fruit in cream or butter sauce. Fried fruit. Meats and other protein foods  Fatty cuts of meat. Ribs, chicken wings, bacon, sausage, bologna, salami, chitterlings, fatback, hot dogs, bratwurst, and packaged lunch meats. Liver and organ meats. Whole eggs and egg yolks. Chicken and [place] with skin. Fried meat. Dairy  Whole or 2% milk, cream, half-and-half, and cream cheese. Whole milk cheeses. Whole-fat  or sweetened yogurt. Full-fat cheeses. Nondairy creamers and whipped toppings. Processed cheese, cheese spreads, and cheese curds. Beverages  Alcohol. Sugar-sweetened drinks such as sodas, lemonade, and fruit drinks. Fats and oils  Butter, stick margarine, lard, shortening, ghee, or bacon fat. Coconut, palm kernel, and palm oils. Sweets and desserts  Corn syrup, sugars, honey, and molasses. Candy. Jam and jelly. Syrup. Sweetened cereals. Cookies, pies, cakes, donuts, muffins, and ice cream. The items listed above may not be a complete list of foods and beverages to avoid. Contact your dietitian for more information. Summary  Your body needs fat and cholesterol for basic functions. However, eating too much of these things can be harmful to your health.  Work with your health care provider and dietitian to follow a diet low in fat and cholesterol. Doing this may help lower your  risk for heart disease and other conditions.  Choose healthy fats, such as monounsaturated and polyunsaturated fats, and foods high in omega-3 fatty acids.  Eat fiber-rich foods, such as whole grains, beans, peas, fruits, and vegetables.  Limit or avoid alcohol, fried foods, and foods high in saturated fats, partially hydrogenated oils, and sugar. This information is not intended to replace advice given to you by your health care provider. Make sure you discuss any questions you have with your health care provider. Document Released: 06/29/2005 Document Revised: 03/16/2017 Document Reviewed: 03/16/2017 Elsevier Interactive Patient Education  2019 [employer].

## 2018-07-01 NOTE — Progress Notes (Addendum)
Subjective:     Patient ID: Kendra Haney , female    DOB: Sep 11, 1960 , 57 y.o.   MRN: [id number]   Chief Complaint  Patient presents with  . Annual Exam   The patient states she uses post menopausal status for birth control. Last LMP was No LMP recorded. (Menstrual status: Perimenopausal).. Negative for Dysmenorrhea and Negative for Menorrhagia Mammogram last done 06/17/2017 Negative for: breast discharge, breast lump(s), breast pain and breast self exam.  Pertinent negatives include abnormal bleeding (hematology), anxiety, decreased libido, depression, difficulty falling sleep, dyspareunia, history of infertility, nocturia, sexual dysfunction, sleep disturbances, urinary incontinence, urinary urgency, vaginal discharge and vaginal itching. Diet regular.The patient states her exercise level is  none.      . The patient's tobacco use is:  Social History   Tobacco Use  Smoking Status Never Smoker  Smokeless Tobacco Never Used  . She has been exposed to passive smoke. The patient's alcohol use is:  Social History   Substance and Sexual Activity  Alcohol Use No  Additional information: Last pap 2016, next one scheduled for due now.   HPI  Here for health maintenance  Obesity - she is not exercising, she is ready to work on losing weight.  Hypertension  This is a chronic problem. The current episode started more than 1 year ago. The problem is unchanged. The problem is uncontrolled. Pertinent negatives include no anxiety, blurred vision, chest pain, malaise/fatigue or shortness of breath. There are no associated agents to hypertension. Risk factors for coronary artery disease include obesity, sedentary lifestyle and post-menopausal state. The current treatment provides mild improvement. Compliance problems include exercise, diet and psychosocial issues.  There is no history of angina.  Cough  This is a chronic problem. The current episode started more than 1 month ago. The problem has  been gradually worsening. The problem occurs constantly. The cough is productive of sputum. Pertinent negatives include no chest pain, chills, nasal congestion, sore throat or shortness of breath. Nothing aggravates the symptoms. She has tried nothing for the symptoms. There is no history of asthma.     Past Medical History:  Diagnosis Date  . Anxiety   . Arthritis   . Asthma    greater than 20 years ago   . Cancer ([place])    hx of skin cancer on back   . Complication of anesthesia    Nausea  & vomiting during eye surgery  . Depression   . DVT (deep venous thrombosis) ([place])    12/2011   . Family history of adverse reaction to anesthesia    sister has problems with nausea and vomiting   . GERD (gastroesophageal reflux disease)   . H/O varicella   . Hydradenitis 04/14/11  . Hypertension    hx of hypertension no longer on meds   . Menopausal symptoms 10/14/10  . Menses, irregular 07/02/10  . PONV (postoperative nausea and vomiting)   . Vulvar itching 04/14/11  . Yeast infection      Family History  Problem Relation Age of Onset  . Hypertension Paternal Grandmother   . Cancer Father   . Hypertension Mother   . Breast cancer Neg Hx      Current Outpatient Medications:  .  acetaminophen (TYLENOL) 500 MG tablet, Take 650 mg by mouth every 6 (six) hours as needed for mild pain. , Disp: , Rfl:  .  ALPRAZolam (XANAX) 1 MG tablet, Take 0.5-3 mg by mouth 3 (three) times daily as needed for anxiety. ,  Disp: , Rfl:  .  buPROPion (WELLBUTRIN XL) 150 MG 24 hr tablet, Take 450 mg by mouth every morning. Pt takes 3 tabs daily., Disp: , Rfl:  .  busPIRone (BUSPAR) 15 MG tablet, Take 15 mg by mouth 3 (three) times daily as needed (DEPRESSION). Patient takes on 15 mg in the am and then as needed the rest of the day., Disp: , Rfl:  .  fexofenadine (ALLEGRA) 180 MG tablet, Take 180 mg by mouth daily., Disp: , Rfl:  .  hydrochlorothiazide (HYDRODIURIL) 12.5 MG tablet, TAKE 1 TABLET BY MOUTH EVERY DAY,  Disp: 90 tablet, Rfl: 1 .  NEXIUM 40 MG capsule, TAKE 1 CAPSULE DAILY (Patient taking differently: patient takes 20 mg daily), Disp: 90 capsule, Rfl: 1 .  terbinafine (LAMISIL) 250 MG tablet, Take 1 tablet (250 mg total) by mouth daily., Disp: 90 tablet, Rfl: 0 .  traZODone (DESYREL) 100 MG tablet, Take 100 mg by mouth at bedtime as needed for sleep. , Disp: , Rfl:  .  vortioxetine HBr (TRINTELLIX) 20 MG TABS tablet, Take 20 mg by mouth daily., Disp: , Rfl:    Allergies  Allergen Reactions  . Doxycycline Nausea And Vomiting  . Dust Mite Extract     UNKNOWN  . Mold Extract [Trichophyton Mentagrophyte]     UNKNOWN  . Nsaids Other (See Comments)    GI UPSET   . Tetracyclines & Related Nausea And Vomiting     Review of Systems  Constitutional: Negative.  Negative for chills and malaise/fatigue.  HENT: Negative.  Negative for sore throat.   Eyes: Negative.  Negative for blurred vision.  Respiratory: Positive for cough. Negative for shortness of breath.   Cardiovascular: Negative.  Negative for chest pain.  Gastrointestinal: Negative.   Endocrine: Negative.   Genitourinary: Negative.   Musculoskeletal: Negative.   Skin: Negative.   Allergic/Immunologic: Negative.   Neurological: Negative.   Hematological: Negative.   Psychiatric/Behavioral: Negative.      Today's Vitals   07/01/18 0923  BP: 140/90  Pulse: 65  Temp: 98.3 F (36.8 C)  TempSrc: Oral  SpO2: 98%  Weight: (!) 407 lb 3.2 oz (184.7 kg)  Height: 5' 7.2" (1.707 m)  PainSc: 0-No pain   Body mass index is 63.4 kg/m.   Objective:  Physical Exam Vitals signs reviewed.  Constitutional:      General: She is not in acute distress.    Appearance: Normal appearance. She is obese.  HENT:     Head: Normocephalic and atraumatic.     Right Ear: Tympanic membrane, ear canal and external ear normal.     Left Ear: Tympanic membrane, ear canal and external ear normal.     Nose: Nose normal.     Mouth/Throat:     Mouth:  Mucous membranes are dry.  Eyes:     Pupils: Pupils are equal, round, and reactive to light.  Neck:     Musculoskeletal: Normal range of motion and neck supple.  Cardiovascular:     Rate and Rhythm: Normal rate and regular rhythm.     Pulses: Normal pulses.     Heart sounds: Normal heart sounds. No murmur.  Pulmonary:     Effort: Pulmonary effort is normal. No respiratory distress.     Breath sounds: Normal breath sounds. No wheezing.     Comments: Intermittent hacking cough noted Abdominal:     General: Bowel sounds are normal. There is no distension.     Palpations: Abdomen is soft. There is  no mass.     Comments: Rounded   Musculoskeletal: Normal range of motion.        General: No swelling or tenderness.  Skin:    General: Skin is warm and dry.     Capillary Refill: Capillary refill takes less than 2 seconds.  Neurological:     General: No focal deficit present.     Mental Status: She is alert and oriented to person, place, and time.  Psychiatric:        Mood and Affect: Mood normal.      Assessment And Plan:     1. Routine general medical examination at a health care facility . Behavior modifications discussed and diet history reviewed.   . Pt will continue to exercise regularly and modify diet with low GI, plant based foods and decrease intake of processed foods.  . Recommend intake of daily multivitamin, Vitamin D, and calcium.  . Recommend mammogram and colonoscopy for preventive screenings, as well as recommend immunizations that include influenza, TDAP  2. Encounter for screening colonoscopy  According to USPTF Colorectal cancer Screening guidelines. Colonoscopy is recommended every 10 years, starting at age 34years.  Will refer to GI for colon cancer screening. - Ambulatory referral to Gastroenterology  3. Encounter for screening mammogram for breast cancer  Pt instructed on Self Breast Exam.According to ACOG guidelines Women aged 82 and older are recommended  to get an annual mammogram. Form completed and given to patient contact the [facility] for appointment scheduing.   Pt encouraged to get annual mammogram - MM Digital Screening; Future  4. Essential hypertension . B/P is controlled.  . CMP ordered to check renal function.  . The importance of regular exercise and dietary modification was stressed to the patient.  . Stressed importance of losing ten percent of her body weight to help with B/P control.  . The weight loss would help with decreasing cardiac and cancer risk as well.  - CBC - CMP14 + Anion Gap - EKG 12-Lead - POCT Urinalysis Dipstick (81002) - POCT UA - Microalbumin  5. Vitamin D deficiency  Will check vitamin d level  Encouraged to take her vitamin d regulary - Vitamin D 1,25 Dihydroxy  6. Class 3 severe obesity due to excess calories with serious comorbidity and body mass index (BMI) of 50.0 to 59.9 in adult [facility])  Chronic  Discussed healthy diet and regular exercise options   Encouraged to exercise at least 150 minutes per week with 2 days of strength training  I have discussed with her Kirke Shaggy, she is going to think about it.   7. Non-seasonal allergic rhinitis, unspecified trigger  Symptoms worsening over the last 3 weeks,   Nasal spray and antihistamine are not effective  Will treat with kenalog 60 mg IM - triamcinolone acetonide (KENALOG-40) injection 60 mg  8. Cough  Intermittent hacking cough  Negative wheezing on exam however will refill her albuterol inhaler - albuterol (PROVENTIL HFA;VENTOLIN HFA) 108 (90 Base) MCG/ACT inhaler 2 puff - triamcinolone acetonide (KENALOG-40) injection 60 mg   Minette Brine, FNP  Patient ID: Kendra Haney, female   DOB: December 14, 1960, 57 y.o.   MRN: [id number]

## 2018-07-02 LAB — CMP14 + ANION GAP
ALBUMIN: 3.9 g/dL (ref 3.5–5.5)
ALT: 16 IU/L (ref 0–32)
AST: 26 IU/L (ref 0–40)
Albumin/Globulin Ratio: 1.4 (ref 1.2–2.2)
Alkaline Phosphatase: 83 IU/L (ref 39–117)
Anion Gap: 16 mmol/L (ref 10.0–18.0)
BUN / CREAT RATIO: 17 (ref 9–23)
BUN: 13 mg/dL (ref 6–24)
Bilirubin Total: 0.4 mg/dL (ref 0.0–1.2)
CALCIUM: 9.2 mg/dL (ref 8.7–10.2)
CO2: 20 mmol/L (ref 20–29)
CREATININE: 0.77 mg/dL (ref 0.57–1.00)
Chloride: 100 mmol/L (ref 96–106)
GFR calc non Af Amer: 87 mL/min/{1.73_m2} (ref >59)
GFR, EST AFRICAN AMERICAN: 101 mL/min/{1.73_m2} (ref >59)
Globulin, Total: 2.8 g/dL (ref 1.5–4.5)
Glucose: 85 mg/dL (ref 65–99)
Potassium: 4.8 mmol/L (ref 3.5–5.2)
Sodium: 136 mmol/L (ref 134–144)
Total Protein: 6.7 g/dL (ref 6.0–8.5)

## 2018-07-02 LAB — VITAMIN D 25 HYDROXY (VIT D DEFICIENCY, FRACTURES): Vit D, 25-Hydroxy: 11.5 ng/mL — ABNORMAL LOW (ref 30.0–100.0)

## 2018-07-02 LAB — CBC
HEMATOCRIT: 34 % (ref 34.0–46.6)
Hemoglobin: 10.3 g/dL — ABNORMAL LOW (ref 11.1–15.9)
MCH: 23.3 pg — ABNORMAL LOW (ref 26.6–33.0)
MCHC: 30.3 g/dL — ABNORMAL LOW (ref 31.5–35.7)
MCV: 77 fL — ABNORMAL LOW (ref 79–97)
Platelets: 340 10*3/uL (ref 150–450)
RBC: 4.43 x10E6/uL (ref 3.77–5.28)
RDW: 14.6 % (ref 12.3–15.4)
WBC: 5.9 10*3/uL (ref 3.4–10.8)

## 2018-07-27 ENCOUNTER — Other Ambulatory Visit: Payer: Self-pay | Admitting: Nurse Practitioner

## 2018-07-27 MED ORDER — VITAMIN D (ERGOCALCIFEROL) 1.25 MG (50000 UNIT) PO CAPS: 50000.0000 [IU] | ORAL_CAPSULE | ORAL | 0 refills | Status: DC

## 2018-09-22 ENCOUNTER — Encounter: Payer: Self-pay | Admitting: Nurse Practitioner

## 2018-09-22 ENCOUNTER — Other Ambulatory Visit: Payer: Self-pay

## 2018-09-22 ENCOUNTER — Ambulatory Visit: Payer: 59 | Admitting: Nurse Practitioner

## 2018-09-22 VITALS — BP 110/68 | Temp 102.3°F | Ht 67.0 in | Wt 398.8 lb

## 2018-09-22 DIAGNOSIS — J101 Influenza due to other identified influenza virus with other respiratory manifestations: Secondary | ICD-10-CM | POA: Diagnosis not present

## 2018-09-22 DIAGNOSIS — R110 Nausea: Secondary | ICD-10-CM

## 2018-09-22 LAB — POC INFLUENZA A&B (BINAX/QUICKVUE)
INFLUENZA A, POC: POSITIVE — AB
Influenza B, POC: NEGATIVE

## 2018-09-22 MED ORDER — ONDANSETRON HCL 4 MG PO TABS: 4.0000 mg | ORAL_TABLET | Freq: Every day | ORAL | 1 refills | Status: AC | PRN

## 2018-09-22 MED ORDER — BALOXAVIR MARBOXIL(40 MG DOSE) 2 X 20 MG PO TBPK: 2.0000 | ORAL_TABLET | Freq: Once | ORAL | 0 refills | Status: DC

## 2018-09-22 MED ORDER — BALOXAVIR MARBOXIL(40 MG DOSE) 2 X 20 MG PO TBPK: 2.0000 | ORAL_TABLET | Freq: Once | ORAL | 0 refills | Status: AC

## 2018-09-22 NOTE — Patient Instructions (Signed)
MAY TAKE TYLENOL FOR FEVER EVERY 4-6 HOURS AS       NEEDED FOR FEVER GREATER THAN 100.5 OR PAIN  BRAT DIET  Influenza, Adult Influenza is also called "the flu." It is an infection in the lungs, nose, and throat (respiratory tract). It is caused by a virus. The flu causes symptoms that are similar to symptoms of a cold. It also causes a high fever and body aches. The flu spreads easily from person to person (is contagious). Getting a flu shot (influenza vaccination) every year is the best way to prevent the flu. What are the causes? This condition is caused by the influenza virus. You can get the virus by: Breathing in droplets that are in the air from the cough or sneeze of a person who has the virus. Touching something that has the virus on it (is contaminated) and then touching your mouth, nose, or eyes. What increases the risk? Certain things may make you more likely to get the flu. These include: Not washing your hands often. Having close contact with many people during cold and flu season. Touching your mouth, eyes, or nose without first washing your hands. Not getting a flu shot every year. You may have a higher risk for the flu, along with serious problems such as a lung infection (pneumonia), if you: Are older than 65. Are pregnant. Have a weakened disease-fighting system (immune system) because of a disease or taking certain medicines. Have a long-term (chronic) illness, such as: Heart, kidney, or lung disease. Diabetes. Asthma. Have a liver disorder. Are very overweight (morbidly obese). Have anemia. This is a condition that affects your red blood cells. What are the signs or symptoms? Symptoms usually begin suddenly and last 4-14 days. They may include: Fever and chills. Headaches, body aches, or muscle aches. Sore throat. Cough. Runny or stuffy (congested) nose. Chest discomfort. Not wanting to eat as much as normal (poor appetite). Weakness or feeling tired  (fatigue). Dizziness. Feeling sick to your stomach (nauseous) or throwing up (vomiting). How is this treated? If the flu is found early, you can be treated with medicine that can help reduce how bad the illness is and how long it lasts (antiviral medicine). This may be given by mouth (orally) or through an IV tube. Taking care of yourself at home can help your symptoms get better. Your doctor may suggest: Taking over-the-counter medicines. Drinking plenty of fluids. The flu often goes away on its own. If you have very bad symptoms or other problems, you may be treated in a hospital. Follow these instructions at home:     Activity Rest as needed. Get plenty of sleep. Stay home from work or school as told by your doctor. Do not leave home until you do not have a fever for 24 hours without taking medicine. Leave home only to visit your doctor. Eating and drinking Take an ORS (oral rehydration solution). This is a drink that is sold at pharmacies and stores. Drink enough fluid to keep your pee (urine) pale yellow. Drink clear fluids in small amounts as you are able. Clear fluids include: Water. Ice chips. Fruit juice that has water added (diluted fruit juice). Low-calorie sports drinks. Eat bland, easy-to-digest foods in small amounts as you are able. These foods include: Bananas. Applesauce. Rice. Lean meats. Toast. Crackers. Do not eat or drink: Fluids that have a lot of sugar or caffeine. Alcohol. Spicy or fatty foods. General instructions Take over-the-counter and prescription medicines only as told by  your doctor. Use a cool mist humidifier to add moisture to the air in your home. This can make it easier for you to breathe. Cover your mouth and nose when you cough or sneeze. Wash your hands with soap and water often, especially after you cough or sneeze. If you cannot use soap and water, use alcohol-based hand sanitizer. Keep all follow-up visits as told by your doctor.  This is important. How is this prevented?  Get a flu shot every year. You may get the flu shot in late summer, fall, or winter. Ask your doctor when you should get your flu shot. Avoid contact with people who are sick during fall and winter (cold and flu season). Contact a doctor if: You get new symptoms. You have: Chest pain. Watery poop (diarrhea). A fever. Your cough gets worse. You start to have more mucus. You feel sick to your stomach. You throw up. Get help right away if you: Have shortness of breath. Have trouble breathing. Have skin or nails that turn a bluish color. Have very bad pain or stiffness in your neck. Get a sudden headache. Get sudden pain in your face or ear. Cannot eat or drink without throwing up. Summary Influenza ("the flu") is an infection in the lungs, nose, and throat. It is caused by a virus. Take over-the-counter and prescription medicines only as told by your doctor. Getting a flu shot every year is the best way to avoid getting the flu. This information is not intended to replace advice given to you by your health care provider. Make sure you discuss any questions you have with your health care provider. Document Released: 04/07/2008 Document Revised: 12/15/2017 Document Reviewed: 12/15/2017 Elsevier Interactive Patient Education  2019 [place] Choices to Help Relieve Diarrhea, Adult When you have diarrhea, the foods you eat and your eating habits are very important. Choosing the right foods and drinks can help:  Relieve diarrhea.  Replace lost fluids and nutrients.  Prevent dehydration. What general guidelines should I follow?  Relieving diarrhea  Choose foods with less than 2 g or .07 oz. of fiber per serving.  Limit fats to less than 8 tsp (38 g or 1.34 oz.) a day.  Avoid the following: ? Foods and beverages sweetened with high-fructose corn syrup, honey, or sugar alcohols such as xylitol, sorbitol, and mannitol. ?  Foods that contain a lot of fat or sugar. ? Fried, greasy, or spicy foods. ? High-fiber grains, breads, and cereals. ? Raw fruits and vegetables.  Eat foods that are rich in probiotics. These foods include dairy products such as yogurt and fermented milk products. They help increase healthy bacteria in the stomach and intestines (gastrointestinal tract, or GI tract).  If you have lactose intolerance, avoid dairy products. These may make your diarrhea worse.  Take medicine to help stop diarrhea (antidiarrheal medicine) only as told by your health care provider. Replacing nutrients  Eat small meals or snacks every 3-4 hours.  Eat bland foods, such as white rice, toast, or baked potato, until your diarrhea starts to get better. Gradually reintroduce nutrient-rich foods as tolerated or as told by your health care provider. This includes: ? Well-cooked protein foods. ? Peeled, seeded, and soft-cooked fruits and vegetables. ? Low-fat dairy products.  Take vitamin and mineral supplements as told by your health care provider. Preventing dehydration  Start by sipping water or a special solution to prevent dehydration (oral rehydration solution, ORS). Urine that is clear or pale yellow means  that you are getting enough fluid.  Try to drink at least 8-10 cups of fluid each day to help replace lost fluids.  You may add other liquids in addition to water, such as clear juice or decaffeinated sports drinks, as tolerated or as told by your health care provider.  Avoid drinks with caffeine, such as coffee, tea, or soft drinks.  Avoid alcohol. What foods are recommended?     The items listed may not be a complete list. Talk with your health care provider about what dietary choices are best for you. Grains White rice. White, [place], or pita breads (fresh or toasted), including plain rolls, buns, or bagels. White pasta. Saltine, soda, or graham crackers. Pretzels. Low-fiber cereal. Cooked cereals  made with water (such as cornmeal, farina, or cream cereals). Plain muffins. Matzo. Melba toast. Zwieback. Vegetables Potatoes (without the skin). Most well-cooked and canned vegetables without skins or seeds. Tender lettuce. Fruits Apple sauce. Fruits canned in juice. Cooked apricots, cherries, grapefruit, peaches, pears, or plums. Fresh bananas and cantaloupe. Meats and other protein foods Baked or boiled chicken. Eggs. Tofu. Fish. Seafood. Smooth nut butters. Ground or well-cooked tender beef, ham, veal, lamb, pork, or poultry. Dairy Plain yogurt, kefir, and unsweetened liquid yogurt. Lactose-free milk, buttermilk, skim milk, or soy milk. Low-fat or nonfat hard cheese. Beverages Water. Low-calorie sports drinks. Fruit juices without pulp. Strained tomato and vegetable juices. Decaffeinated teas. Sugar-free beverages not sweetened with sugar alcohols. Oral rehydration solutions, if approved by your health care provider. Seasoning and other foods Bouillon, broth, or soups made from recommended foods. What foods are not recommended? The items listed may not be a complete list. Talk with your health care provider about what dietary choices are best for you. Grains Whole grain, whole wheat, bran, or rye breads, rolls, pastas, and crackers. Wild or brown rice. Whole grain or bran cereals. Barley. Oats and oatmeal. Corn tortillas or taco shells. Granola. Popcorn. Vegetables Raw vegetables. Fried vegetables. Cabbage, broccoli, Brussels sprouts, artichokes, baked beans, beet greens, corn, kale, legumes, peas, sweet potatoes, and yams. Potato skins. Cooked spinach and cabbage. Fruits Dried fruit, including raisins and dates. Raw fruits. Stewed or dried prunes. Canned fruits with syrup. Meat and other protein foods Fried or fatty meats. Deli meats. Chunky nut butters. Nuts and seeds. Beans and lentils. Berniece Salines. Hot dogs. Sausage. Dairy High-fat cheeses. Whole milk, chocolate milk, and beverages made  with milk, such as milk shakes. Half-and-half. Cream. sour cream. Ice cream. Beverages Caffeinated beverages (such as coffee, tea, soda, or energy drinks). Alcoholic beverages. Fruit juices with pulp. Prune juice. Soft drinks sweetened with high-fructose corn syrup or sugar alcohols. High-calorie sports drinks. Fats and oils Butter. Cream sauces. Margarine. Salad oils. Plain salad dressings. Olives. Avocados. Mayonnaise. Sweets and desserts Sweet rolls, doughnuts, and sweet breads. Sugar-free desserts sweetened with sugar alcohols such as xylitol and sorbitol. Seasoning and other foods Honey. Hot sauce. Chili powder. Gravy. Cream-based or milk-based soups. Pancakes and waffles. Summary  When you have diarrhea, the foods you eat and your eating habits are very important.  Make sure you get at least 8-10 cups of fluid each day, or enough to keep your urine clear or pale yellow.  Eat bland foods and gradually reintroduce healthy, nutrient-rich foods as tolerated, or as told by your health care provider.  Avoid high-fiber, fried, greasy, or spicy foods. This information is not intended to replace advice given to you by your health care provider. Make sure you discuss any questions you have  with your health care provider. Document Released: 09/19/2003 Document Revised: 06/26/2016 Document Reviewed: 06/26/2016 Elsevier Interactive Patient Education  2019 [place] Diet A bland diet consists of foods that are often soft and do not have a lot of fat, fiber, or extra seasonings. Foods without fat, fiber, or seasoning are easier for the body to digest. They are also less likely to irritate your mouth, throat, stomach, and other parts of your digestive system. A bland diet is sometimes called a BRAT diet. What is my plan? Your health care provider or food and nutrition specialist (dietitian) may recommend specific changes to your diet to prevent symptoms or to treat your symptoms. These  changes may include:  Eating small meals often.  Cooking food until it is soft enough to chew easily.  Chewing your food well.  Drinking fluids slowly.  Not eating foods that are very spicy, sour, or fatty.  Not eating citrus fruits, such as oranges and grapefruit. What do I need to know about this diet?  Eat a variety of foods from the bland diet food list.  Do not follow a bland diet longer than needed.  Ask your health care provider whether you should take vitamins or supplements. What foods can I eat? Grains  Hot cereals, such as cream of wheat. Rice. Bread, crackers, or tortillas made from refined white flour. Vegetables Canned or cooked vegetables. Mashed or boiled potatoes. Fruits  Bananas. Applesauce. Other types of cooked or canned fruit with the skin and seeds removed, such as canned peaches or pears. Meats and other proteins  Scrambled eggs. Creamy peanut butter or other nut butters. Lean, well-cooked meats, such as chicken or fish. Tofu. Soups or broths. Dairy Low-fat dairy products, such as milk, cottage cheese, or yogurt. Beverages  Water. Herbal tea. Apple juice. Fats and oils Mild salad dressings. Canola or olive oil. Sweets and desserts Pudding. Custard. Fruit gelatin. Ice cream. The items listed above may not be a complete list of recommended foods and beverages. Contact a dietitian for more options. What foods are not recommended? Grains Whole grain breads and cereals. Vegetables Raw vegetables. Fruits Raw fruits, especially citrus, berries, or dried fruits. Dairy Whole fat dairy foods. Beverages Caffeinated drinks. Alcohol. Seasonings and condiments Strongly flavored seasonings or condiments. Hot sauce. Salsa. Other foods Spicy foods. Fried foods. Sour foods, such as pickled or fermented foods. Foods with high sugar content. Foods high in fiber. The items listed above may not be a complete list of foods and beverages to avoid. Contact a  dietitian for more information. Summary  A bland diet consists of foods that are often soft and do not have a lot of fat, fiber, or extra seasonings.  Foods without fat, fiber, or seasoning are easier for the body to digest.  Check with your health care provider to see how long you should follow this diet plan. It is not meant to be followed for long periods. This information is not intended to replace advice given to you by your health care provider. Make sure you discuss any questions you have with your health care provider. Document Released: 10/21/2015 Document Revised: 07/28/2017 Document Reviewed: 07/28/2017 Elsevier Interactive Patient Education  2019 [employer].

## 2018-09-22 NOTE — Progress Notes (Addendum)
Subjective:     Patient ID: Kendra Haney , female    DOB: Jan 07, 1961 , 58 y.o.   MRN: [id number]   Chief Complaint  Patient presents with  . Influenza    HPI  Influenza  This is a new problem. The current episode started yesterday. Associated symptoms include fatigue, headaches and vomiting. Pertinent negatives include no abdominal pain, myalgias, nausea or sore throat. Nothing aggravates the symptoms. Treatments tried: mucinex yesterday. The treatment provided no relief.     Past Medical History:  Diagnosis Date  . Anxiety   . Arthritis   . Asthma    greater than 20 years ago   . Cancer ([place])    hx of skin cancer on back   . Complication of anesthesia    Nausea  & vomiting during eye surgery  . Depression   . DVT (deep venous thrombosis) ([place])    12/2011   . Family history of adverse reaction to anesthesia    sister has problems with nausea and vomiting   . GERD (gastroesophageal reflux disease)   . H/O varicella   . Hydradenitis 04/14/11  . Hypertension    hx of hypertension no longer on meds   . Menopausal symptoms 10/14/10  . Menses, irregular 07/02/10  . PONV (postoperative nausea and vomiting)   . Vulvar itching 04/14/11  . Yeast infection      Family History  Problem Relation Age of Onset  . Hypertension Paternal Grandmother   . Cancer Father   . Hypertension Mother   . Breast cancer Neg Hx      Current Outpatient Medications:  .  acetaminophen (TYLENOL) 500 MG tablet, Take 650 mg by mouth every 6 (six) hours as needed for mild pain. , Disp: , Rfl:  .  buPROPion (WELLBUTRIN XL) 150 MG 24 hr tablet, Take 450 mg by mouth every morning. Pt takes 3 tabs daily., Disp: , Rfl:  .  busPIRone (BUSPAR) 15 MG tablet, Take 15 mg by mouth 3 (three) times daily as needed (DEPRESSION). Patient takes on 15 mg in the am and then as needed the rest of the day., Disp: , Rfl:  .  fexofenadine (ALLEGRA) 180 MG tablet, Take 180 mg by mouth daily., Disp: , Rfl:  .  traZODone  (DESYREL) 100 MG tablet, Take 100 mg by mouth at bedtime as needed for sleep. , Disp: , Rfl:  .  Vitamin D, Ergocalciferol, (DRISDOL) 1.25 MG (50000 UT) CAPS capsule, Take 1 capsule (50,000 Units total) by mouth every 7 (seven) days., Disp: 24 capsule, Rfl: 0 .  vortioxetine HBr (TRINTELLIX) 20 MG TABS tablet, Take 20 mg by mouth daily., Disp: , Rfl:  .  Baloxavir Marboxil,40 MG Dose, (XOFLUZA) 2 x 20 MG TBPK, Take 2 tablets by mouth once for 1 dose., Disp: 1 each, Rfl: 0  Current Facility-Administered Medications:  .  albuterol (PROVENTIL HFA;VENTOLIN HFA) 108 (90 Base) MCG/ACT inhaler 2 puff, 2 puff, Inhalation, Once, Minette Brine, FNP .  triamcinolone acetonide (KENALOG-40) injection 60 mg, 60 mg, Intramuscular, Once, Minette Brine, FNP   Allergies  Allergen Reactions  . Doxycycline Nausea And Vomiting  . Dust Mite Extract     UNKNOWN  . Mold Extract [Trichophyton Mentagrophyte]     UNKNOWN  . Nsaids Other (See Comments)    GI UPSET   . Tetracyclines & Related Nausea And Vomiting     Review of Systems  Constitutional: Positive for fatigue.  HENT: Negative for sore throat.   Gastrointestinal: Positive  for vomiting. Negative for abdominal pain and nausea.  Musculoskeletal: Negative for myalgias.  Neurological: Positive for headaches.     Today's Vitals   09/22/18 1617  BP: 110/68  Temp: (!) 102.3 F (39.1 C)  TempSrc: Oral  Weight: (!) 398 lb 12.8 oz (180.9 kg)  Height: 5\' 7"  (1.702 m)   Body mass index is 62.46 kg/m.   Objective:  Physical Exam Constitutional:      Appearance: Normal appearance. She is obese. She is ill-appearing.  HENT:     Head: Normocephalic and atraumatic.     Right Ear: Tympanic membrane, ear canal and external ear normal. There is no impacted cerumen.     Left Ear: Tympanic membrane, ear canal and external ear normal. There is no impacted cerumen.     Nose: Nose normal.     Mouth/Throat:     Mouth: Mucous membranes are moist.  Eyes:      Pupils: Pupils are equal, round, and reactive to light.  Cardiovascular:     Rate and Rhythm: Normal rate.     Pulses: Normal pulses.     Heart sounds: Normal heart sounds. No murmur.  Pulmonary:     Effort: Pulmonary effort is normal. No respiratory distress.     Breath sounds: Normal breath sounds. No wheezing.  Neurological:     General: No focal deficit present.     Mental Status: She is alert and oriented to person, place, and time.  Psychiatric:        Mood and Affect: Mood normal.        Behavior: Behavior normal.        Thought Content: Thought content normal.        Judgment: Judgment normal.         Assessment And Plan:     1. Influenza A  She is positive for Influenza A  Will treat with xofluza   Symptomatic treatment for fever, pain, cough and nausea  No work until March 18th  - Baloxavir Marboxil,40 MG Dose, (XOFLUZA) 2 x 20 MG TBPK; Take 2 tablets by mouth once for 1 dose.  Dispense: 1 each; Refill: 0        Minette Brine, FNP

## 2018-09-27 ENCOUNTER — Encounter: Payer: Self-pay | Admitting: Nurse Practitioner

## 2018-09-27 ENCOUNTER — Other Ambulatory Visit: Payer: Self-pay | Admitting: Nurse Practitioner

## 2018-09-27 DIAGNOSIS — R05 Cough: Secondary | ICD-10-CM

## 2018-09-27 DIAGNOSIS — R059 Cough, unspecified: Secondary | ICD-10-CM

## 2018-09-27 MED ORDER — BENZONATATE 100 MG PO CAPS: 100.0000 mg | ORAL_CAPSULE | Freq: Four times a day (QID) | ORAL | 1 refills | Status: DC | PRN

## 2019-03-07 ENCOUNTER — Ambulatory Visit: Payer: 59 | Admitting: Nurse Practitioner

## 2019-03-24 ENCOUNTER — Other Ambulatory Visit: Payer: Self-pay | Admitting: Gastroenterology

## 2019-06-21 ENCOUNTER — Ambulatory Visit (INDEPENDENT_AMBULATORY_CARE_PROVIDER_SITE_OTHER): Payer: 59 | Admitting: Nurse Practitioner

## 2019-06-21 ENCOUNTER — Encounter: Payer: Self-pay | Admitting: Nurse Practitioner

## 2019-06-21 ENCOUNTER — Other Ambulatory Visit: Payer: Self-pay

## 2019-06-21 VITALS — BP 128/86 | HR 70 | Temp 98.0°F | Ht 69.4 in | Wt >= 6400 oz

## 2019-06-21 DIAGNOSIS — Z0000 Encounter for general adult medical examination without abnormal findings: Secondary | ICD-10-CM | POA: Diagnosis not present

## 2019-06-21 DIAGNOSIS — E559 Vitamin D deficiency, unspecified: Secondary | ICD-10-CM

## 2019-06-21 DIAGNOSIS — R6889 Other general symptoms and signs: Secondary | ICD-10-CM

## 2019-06-21 DIAGNOSIS — Z6841 Body Mass Index (BMI) 40.0 and over, adult: Secondary | ICD-10-CM

## 2019-06-21 DIAGNOSIS — Z1211 Encounter for screening for malignant neoplasm of colon: Secondary | ICD-10-CM

## 2019-06-21 DIAGNOSIS — Z23 Encounter for immunization: Secondary | ICD-10-CM

## 2019-06-21 DIAGNOSIS — I10 Essential (primary) hypertension: Secondary | ICD-10-CM | POA: Diagnosis not present

## 2019-06-21 DIAGNOSIS — Z113 Encounter for screening for infections with a predominantly sexual mode of transmission: Secondary | ICD-10-CM

## 2019-06-21 DIAGNOSIS — R4189 Other symptoms and signs involving cognitive functions and awareness: Secondary | ICD-10-CM

## 2019-06-21 DIAGNOSIS — D509 Iron deficiency anemia, unspecified: Secondary | ICD-10-CM

## 2019-06-21 LAB — POCT URINALYSIS DIPSTICK
Bilirubin, UA: NEGATIVE
Glucose, UA: NEGATIVE
Ketones, UA: NEGATIVE
Leukocytes, UA: NEGATIVE
Nitrite, UA: NEGATIVE
Protein, UA: NEGATIVE
Spec Grav, UA: 1.015 (ref 1.010–1.025)
Urobilinogen, UA: 0.2 E.U./dL
pH, UA: 6.5 (ref 5.0–8.0)

## 2019-06-21 LAB — POCT UA - MICROALBUMIN
Albumin/Creatinine Ratio, Urine, POC: <30
Creatinine, POC: 200 mg/dL
Microalbumin Ur, POC: 30 mg/L

## 2019-06-21 MED ORDER — FERROUS SULFATE 325 (65 FE) MG PO TBEC: 325.0000 mg | DELAYED_RELEASE_TABLET | Freq: Three times a day (TID) | ORAL | 3 refills | Status: DC

## 2019-06-21 NOTE — Progress Notes (Signed)
Subjective:     Patient ID: Kendra Haney , female    DOB: 1960-10-17 , 58 y.o.   MRN: [id number]    Chief Complaint  Patient presents with  . Annual Exam   The patient states she uses post menopausal status for birth control. Negative for Dysmenorrhea and Negative for Menorrhagia Mammogram last done 06/17/2017 Negative for: breast discharge, breast lump(s), breast pain and breast self exam.  Pertinent negatives include abnormal bleeding (hematology), anxiety, decreased libido, depression, difficulty falling sleep, dyspareunia, history of infertility, nocturia, sexual dysfunction, sleep disturbances, urinary incontinence, urinary urgency, vaginal discharge and vaginal itching. Diet regular.The patient states her exercise level is  none.      . The patient's tobacco use is:  Social History   Tobacco Use  Smoking Status Never Smoker  Smokeless Tobacco Never Used  . She has been exposed to passive smoke. The patient's alcohol use is:  Social History   Substance and Sexual Activity  Alcohol Use No  Additional information: Last pap 2016, next one scheduled for due now.   HPI  Here for health maintenance  Obesity - she is not exercising, she is ready to work on losing weight.  Wt Readings from Last 3 Encounters: 06/21/19 : (!) 423 lb 6.4 oz (192.1 kg) 09/22/18 : (!) 398 lb 12.8 oz (180.9 kg) 07/01/18 : (!) 407 lb 3.2 oz (184.7 kg)    Hypertension This is a chronic problem. The current episode started more than 1 year ago. The problem is unchanged. The problem is uncontrolled. Pertinent negatives include no anxiety, blurred vision or malaise/fatigue. There are no associated agents to hypertension. Risk factors for coronary artery disease include obesity, sedentary lifestyle and post-menopausal state. The current treatment provides mild improvement. Compliance problems include exercise, diet and psychosocial issues.  There is no history of angina or kidney disease. There is no history  of chronic renal disease.     The patient states she is post menopausal status for birth control.  Mammogram last done 06/17/2017.  Negative for: breast discharge, breast lump(s), breast pain and breast self exam.  Pertinent negatives include abnormal bleeding (hematology), anxiety, decreased libido, depression, difficulty falling sleep, dyspareunia, history of infertility, nocturia, sexual dysfunction, sleep disturbances, urinary incontinence, urinary urgency, vaginal discharge and vaginal itching. Diet regular. The patient states her exercise level is minimal.      The patient's tobacco use is:  Social History   Tobacco Use  Smoking Status Never Smoker  Smokeless Tobacco Never Used   She has been exposed to passive smoke. The patient's alcohol use is:  Social History   Substance and Sexual Activity  Alcohol Use No   Additional information: She is due for her PAP at [facility]  Past Medical History:  Diagnosis Date  . Anxiety   . Arthritis   . Asthma    greater than 20 years ago   . Cancer ([place])    hx of skin cancer on back   . Complication of anesthesia    Nausea  & vomiting during eye surgery  . Depression   . DVT (deep venous thrombosis) ([place])    12/2011   . Family history of adverse reaction to anesthesia    sister has problems with nausea and vomiting   . GERD (gastroesophageal reflux disease)   . H/O varicella   . Hydradenitis 04/14/11  . Hypertension    hx of hypertension no longer on meds   . Menopausal symptoms 10/14/10  . Menses, irregular 07/02/10  .  PONV (postoperative nausea and vomiting)   . Vulvar itching 04/14/11  . Yeast infection      Family History  Problem Relation Age of Onset  . Hypertension Paternal Grandmother   . Cancer Father   . Hypertension Mother   . Breast cancer Neg Hx      Current Outpatient Medications:  .  acetaminophen (TYLENOL) 500 MG tablet, Take 650 mg by mouth every 6 (six) hours as needed for mild pain. , Disp: , Rfl:   .  buPROPion (WELLBUTRIN XL) 150 MG 24 hr tablet, Take 450 mg by mouth every morning. Pt takes 3 tabs daily., Disp: , Rfl:  .  fexofenadine (ALLEGRA) 180 MG tablet, Take 180 mg by mouth daily., Disp: , Rfl:  .  ondansetron (ZOFRAN) 4 MG tablet, Take 1 tablet (4 mg total) by mouth daily as needed for nausea or vomiting., Disp: 30 tablet, Rfl: 1 .  traZODone (DESYREL) 100 MG tablet, Take 100 mg by mouth at bedtime as needed for sleep. , Disp: , Rfl:  .  vortioxetine HBr (TRINTELLIX) 20 MG TABS tablet, Take 20 mg by mouth daily., Disp: , Rfl:   Current Facility-Administered Medications:  .  albuterol (PROVENTIL HFA;VENTOLIN HFA) 108 (90 Base) MCG/ACT inhaler 2 puff, 2 puff, Inhalation, Once, Minette Brine, FNP .  triamcinolone acetonide (KENALOG-40) injection 60 mg, 60 mg, Intramuscular, Once, Minette Brine, FNP   Allergies  Allergen Reactions  . Doxycycline Nausea And Vomiting  . Dust Mite Extract     UNKNOWN  . Mold Extract [Trichophyton Mentagrophyte]     UNKNOWN  . Nsaids Other (See Comments)    GI UPSET   . Tetracyclines & Related Nausea And Vomiting     Review of Systems  Constitutional: Negative.  Negative for malaise/fatigue.  HENT: Negative.   Eyes: Negative.  Negative for blurred vision.  Respiratory: Negative.   Cardiovascular: Negative.   Gastrointestinal: Negative.   Endocrine: Negative.   Genitourinary: Negative.   Musculoskeletal: Negative.   Skin: Negative.   Allergic/Immunologic: Negative.   Neurological: Negative.   Hematological: Negative.   Psychiatric/Behavioral: Negative.      Today's Vitals   06/21/19 1002  BP: 128/86  Pulse: 70  Temp: 98 F (36.7 C)  TempSrc: Oral  Weight: (!) 423 lb 6.4 oz (192.1 kg)  Height: 5' 9.4" (1.763 m)  PainSc: 0-No pain   Body mass index is 61.81 kg/m.   Objective:  Physical Exam Vitals reviewed.  Constitutional:      General: She is not in acute distress.    Appearance: Normal appearance.     Comments: Super  morbid obese  HENT:     Head: Normocephalic and atraumatic.     Right Ear: Tympanic membrane, ear canal and external ear normal. There is no impacted cerumen.     Left Ear: Tympanic membrane, ear canal and external ear normal. There is no impacted cerumen.     Mouth/Throat:     Mouth: Mucous membranes are moist.  Eyes:     Pupils: Pupils are equal, round, and reactive to light.  Cardiovascular:     Rate and Rhythm: Normal rate and regular rhythm.     Pulses: Normal pulses.     Heart sounds: Normal heart sounds. No murmur.  Pulmonary:     Effort: Pulmonary effort is normal. No respiratory distress.     Breath sounds: Normal breath sounds. No wheezing.  Abdominal:     General: Bowel sounds are normal. There is no distension.  Palpations: Abdomen is soft. There is no mass.     Comments: Rounded   Musculoskeletal:        General: No swelling or tenderness. Normal range of motion.     Cervical back: Normal range of motion and neck supple.  Skin:    General: Skin is warm and dry.     Capillary Refill: Capillary refill takes less than 2 seconds.  Neurological:     General: No focal deficit present.     Mental Status: She is alert and oriented to person, place, and time.  Psychiatric:        Mood and Affect: Mood normal.        Behavior: Behavior normal.        Thought Content: Thought content normal.        Judgment: Judgment normal.      Assessment And Plan:     1. Routine general medical examination at a health care facility . Behavior modifications discussed and diet history reviewed.   . Pt will continue to exercise regularly and modify diet with low GI, plant based foods and decrease intake of processed foods.  . Recommend intake of daily multivitamin, Vitamin D, and calcium.  . Recommend mammogram and colonoscopy for preventive screenings, as well as recommend immunizations that include influenza, TDAP - CMP14+EGFR - Lipid Profile - CBC with Differential/Platelet  2.  Encounter for screening colonoscopy  According to USPTF Colorectal cancer Screening guidelines. Colonoscopy is recommended every 10 years, starting at age 54years.  Will refer to GI for colon cancer screening.  I have stressed with her the importance of having this done as she is past due - Ambulatory referral to Gastroenterology  3. Need for influenza vaccination  Influenza vaccine given in office  Advised to take Tylenol as needed for muscle aches or fever - Flu Vaccine QUAD 6+ mos PF IM (Fluarix Quad PF)  4. Screening for STD (sexually transmitted disease)  Will check HIV - HIV antibody (with reflex)  5. Essential hypertension . B/P is controlled.  . CMP ordered to check renal function.  . The importance of regular exercise and dietary modification was stressed to the patient.  . Stressed importance of losing ten percent of her body weight to help with B/P control.  - POCT Urinalysis Dipstick (81002) - POCT UA - Microalbumin - EKG 12-Lead - CMP14+EGFR  6. Class 3 severe obesity due to excess calories with serious comorbidity and body mass index (BMI) of 50.0 to 59.9 in adult [facility]) Chronic Discussed healthy diet and regular exercise options  Encouraged to exercise at least 150 minutes per week with 2 days of strength training as tolerated She is unable to take several weight loss medications due to the medications she takes for her mood. I will check her insulin level for insulin resistance She would like to wait until early next year to talk further about weight loss medications - Insulin, random(561)  7. Vitamin D deficiency  Will check vitamin D level and supplement as needed.     Also encouraged to spend 15 minutes in the sun daily.  - Vitamin D (25 hydroxy) - Vitamin D, Ergocalciferol, (DRISDOL) 1.25 MG (50000 UT) CAPS capsule; Take 1 capsule (50,000 Units total) by mouth 2 (two) times a week.  Dispense: 24 capsule; Refill: 1  8. Forgetfulness  She feels like  she is having more periods of forgetfulness and would like her vitamin B12 checked, has taken injections in the past - Vitamin B12  9. Iron deficiency anemia, unspecified iron deficiency anemia type  Previous history of iron deficiency anemia, I will check her levels because this could be affecting her memory as well. - Iron, TIBC and Ferritin Panel - ferrous sulfate 325 (65 FE) MG EC tablet; Take 1 tablet (325 mg total) by mouth 3 (three) times daily with meals.  Dispense: 90 tablet; Refill: [place], FNP  Patient ID: Kendra Haney, female   DOB: 06-10-1961, 58 y.o.   MRN: [id number]

## 2019-06-21 NOTE — Patient Instructions (Addendum)
Health Maintenance  Topic Date Due  . PAP SMEAR-Modifier  03/15/1984  . COLONOSCOPY  03/15/2013  . MAMMOGRAM  06/18/2019  . TETANUS/TDAP  05/19/2020  . INFLUENZA VACCINE  Completed  . Hepatitis C Screening  Completed  . HIV Screening  Completed    Health Maintenance, Female Adopting a healthy lifestyle and getting preventive care are important in promoting health and wellness. Ask your health care provider about:  The right schedule for you to have regular tests and exams.  Things you can do on your own to prevent diseases and keep yourself healthy. What should I know about diet, weight, and exercise? Eat a healthy diet   Eat a diet that includes plenty of vegetables, fruits, low-fat dairy products, and lean protein.  Do not eat a lot of foods that are high in solid fats, added sugars, or sodium. Maintain a healthy weight Body mass index (BMI) is used to identify weight problems. It estimates body fat based on height and weight. Your health care provider can help determine your BMI and help you achieve or maintain a healthy weight. Get regular exercise Get regular exercise. This is one of the most important things you can do for your health. Most adults should:  Exercise for at least 150 minutes each week. The exercise should increase your heart rate and make you sweat (moderate-intensity exercise).  Do strengthening exercises at least twice a week. This is in addition to the moderate-intensity exercise.  Spend less time sitting. Even light physical activity can be beneficial. Watch cholesterol and blood lipids Have your blood tested for lipids and cholesterol at 56 years of age, then have this test every 5 years. Have your cholesterol levels checked more often if:  Your lipid or cholesterol levels are high.  You are older than 56 years of age.  You are at high risk for heart disease. What should I know about cancer screening? Depending on your health history and family  history, you may need to have cancer screening at various ages. This may include screening for:  Breast cancer.  Cervical cancer.  Colorectal cancer.  Skin cancer.  Lung cancer. What should I know about heart disease, diabetes, and high blood pressure? Blood pressure and heart disease  High blood pressure causes heart disease and increases the risk of stroke. This is more likely to develop in people who have high blood pressure readings, are of African descent, or are overweight.  Have your blood pressure checked: ? Every 3-5 years if you are 64-79 years of age. ? Every year if you are 36 years old or older. Diabetes Have regular diabetes screenings. This checks your fasting blood sugar level. Have the screening done:  Once every three years after age 78 if you are at a normal weight and have a low risk for diabetes.  More often and at a younger age if you are overweight or have a high risk for diabetes. What should I know about preventing infection? Hepatitis B If you have a higher risk for hepatitis B, you should be screened for this virus. Talk with your health care provider to find out if you are at risk for hepatitis B infection. Hepatitis C Testing is recommended for:  Everyone born from [birth year] through 1963.  Anyone with known risk factors for hepatitis C. Sexually transmitted infections (STIs)  Get screened for STIs, including gonorrhea and chlamydia, if: ? You are sexually active and are younger than 56 years of age. ? You are older  than 56 years of age and your health care provider tells you that you are at risk for this type of infection. ? Your sexual activity has changed since you were last screened, and you are at increased risk for chlamydia or gonorrhea. Ask your health care provider if you are at risk.  Ask your health care provider about whether you are at high risk for HIV. Your health care provider may recommend a prescription medicine to help prevent HIV  infection. If you choose to take medicine to prevent HIV, you should first get tested for HIV. You should then be tested every 3 months for as long as you are taking the medicine. Pregnancy  If you are about to stop having your period (premenopausal) and you may become pregnant, seek counseling before you get pregnant.  Take 400 to 800 micrograms (mcg) of folic acid every day if you become pregnant.  Ask for birth control (contraception) if you want to prevent pregnancy. Osteoporosis and menopause Osteoporosis is a disease in which the bones lose minerals and strength with aging. This can result in bone fractures. If you are 27 years old or older, or if you are at risk for osteoporosis and fractures, ask your health care provider if you should:  Be screened for bone loss.  Take a calcium or vitamin D supplement to lower your risk of fractures.  Be given hormone replacement therapy (HRT) to treat symptoms of menopause. Follow these instructions at home: Lifestyle  Do not use any products that contain nicotine or tobacco, such as cigarettes, e-cigarettes, and chewing tobacco. If you need help quitting, ask your health care provider.  Do not use street drugs.  Do not share needles.  Ask your health care provider for help if you need support or information about quitting drugs. Alcohol use  Do not drink alcohol if: ? Your health care provider tells you not to drink. ? You are pregnant, may be pregnant, or are planning to become pregnant.  If you drink alcohol: ? Limit how much you use to 0-1 drink a day. ? Limit intake if you are breastfeeding.  Be aware of how much alcohol is in your drink. In the [place] one drink equals one 12 oz bottle of beer (355 mL), one 5 oz glass of wine (148 mL), or one 1 oz glass of hard liquor (44 mL). General instructions  Schedule regular health, dental, and eye exams.  Stay current with your vaccines.  Tell your health care provider if: ? You  often feel depressed. ? You have ever been abused or do not feel safe at home. Summary  Adopting a healthy lifestyle and getting preventive care are important in promoting health and wellness.  Follow your health care provider's instructions about healthy diet, exercising, and getting tested or screened for diseases.  Follow your health care provider's instructions on monitoring your cholesterol and blood pressure. This information is not intended to replace advice given to you by your health care provider. Make sure you discuss any questions you have with your health care provider. Document Released: 01/12/2011 Document Revised: 06/22/2018 Document Reviewed: 06/22/2018 Elsevier Patient Education  2020 [employer].

## 2019-06-22 LAB — LIPID PANEL
Chol/HDL Ratio: 2.4 ratio (ref 0.0–4.4)
Cholesterol, Total: 173 mg/dL (ref 100–199)
HDL: 73 mg/dL (ref >39)
LDL Chol Calc (NIH): 86 mg/dL (ref 0–99)
Triglycerides: 73 mg/dL (ref 0–149)
VLDL Cholesterol Cal: 14 mg/dL (ref 5–40)

## 2019-06-22 LAB — CMP14+EGFR
ALT: 20 IU/L (ref 0–32)
AST: 25 IU/L (ref 0–40)
Albumin/Globulin Ratio: 1.7 (ref 1.2–2.2)
Albumin: 4.3 g/dL (ref 3.8–4.9)
Alkaline Phosphatase: 88 IU/L (ref 39–117)
BUN/Creatinine Ratio: 12 (ref 9–23)
BUN: 12 mg/dL (ref 6–24)
Bilirubin Total: 0.6 mg/dL (ref 0.0–1.2)
CO2: 24 mmol/L (ref 20–29)
Calcium: 9.2 mg/dL (ref 8.7–10.2)
Chloride: 100 mmol/L (ref 96–106)
Creatinine, Ser: 0.98 mg/dL (ref 0.57–1.00)
GFR calc Af Amer: 75 mL/min/{1.73_m2} (ref >59)
GFR calc non Af Amer: 65 mL/min/{1.73_m2} (ref >59)
Globulin, Total: 2.6 g/dL (ref 1.5–4.5)
Glucose: 103 mg/dL — ABNORMAL HIGH (ref 65–99)
Potassium: 4.8 mmol/L (ref 3.5–5.2)
Sodium: 139 mmol/L (ref 134–144)
Total Protein: 6.9 g/dL (ref 6.0–8.5)

## 2019-06-22 LAB — VITAMIN D 25 HYDROXY (VIT D DEFICIENCY, FRACTURES): Vit D, 25-Hydroxy: 7.9 ng/mL — ABNORMAL LOW (ref 30.0–100.0)

## 2019-06-22 LAB — VITAMIN B12: Vitamin B-12: 180 pg/mL — ABNORMAL LOW (ref 232–1245)

## 2019-06-22 LAB — IRON,TIBC AND FERRITIN PANEL
Ferritin: 17 ng/mL (ref 15–150)
Iron Saturation: 18 % (ref 15–55)
Iron: 68 ug/dL (ref 27–159)
Total Iron Binding Capacity: 379 ug/dL (ref 250–450)
UIBC: 311 ug/dL (ref 131–425)

## 2019-06-22 LAB — CBC WITH DIFFERENTIAL/PLATELET
Basophils Absolute: 0 10*3/uL (ref 0.0–0.2)
Basos: 1 %
EOS (ABSOLUTE): 0.1 10*3/uL (ref 0.0–0.4)
Eos: 2 %
Hematocrit: 39.6 % (ref 34.0–46.6)
Hemoglobin: 12.5 g/dL (ref 11.1–15.9)
Immature Grans (Abs): 0 10*3/uL (ref 0.0–0.1)
Immature Granulocytes: 0 %
Lymphocytes Absolute: 1.3 10*3/uL (ref 0.7–3.1)
Lymphs: 25 %
MCH: 26.2 pg — ABNORMAL LOW (ref 26.6–33.0)
MCHC: 31.6 g/dL (ref 31.5–35.7)
MCV: 83 fL (ref 79–97)
Monocytes Absolute: 0.4 10*3/uL (ref 0.1–0.9)
Monocytes: 7 %
Neutrophils Absolute: 3.3 10*3/uL (ref 1.4–7.0)
Neutrophils: 65 %
Platelets: 276 10*3/uL (ref 150–450)
RBC: 4.77 x10E6/uL (ref 3.77–5.28)
RDW: 14.6 % (ref 11.7–15.4)
WBC: 5.1 10*3/uL (ref 3.4–10.8)

## 2019-06-22 LAB — HIV ANTIBODY (ROUTINE TESTING W REFLEX): HIV Screen 4th Generation wRfx: NONREACTIVE

## 2019-06-22 LAB — INSULIN, RANDOM: INSULIN: 15.6 u[IU]/mL (ref 2.6–24.9)

## 2019-06-22 MED ORDER — VITAMIN D (ERGOCALCIFEROL) 1.25 MG (50000 UNIT) PO CAPS: 50000.0000 [IU] | ORAL_CAPSULE | ORAL | 1 refills | Status: DC

## 2019-06-28 ENCOUNTER — Encounter: Payer: Self-pay | Admitting: Nurse Practitioner

## 2019-06-28 ENCOUNTER — Ambulatory Visit: Payer: 59

## 2019-06-29 ENCOUNTER — Encounter (HOSPITAL_COMMUNITY): Payer: Self-pay

## 2019-06-29 ENCOUNTER — Ambulatory Visit (HOSPITAL_COMMUNITY): Admit: 2019-06-29 | Payer: 59 | Admitting: Gastroenterology

## 2019-06-29 SURGERY — ESOPHAGOGASTRODUODENOSCOPY (EGD) WITH PROPOFOL
Anesthesia: Monitor Anesthesia Care

## 2019-07-11 ENCOUNTER — Ambulatory Visit: Payer: 59

## 2019-07-11 ENCOUNTER — Ambulatory Visit (INDEPENDENT_AMBULATORY_CARE_PROVIDER_SITE_OTHER): Payer: 59

## 2019-07-11 ENCOUNTER — Other Ambulatory Visit: Payer: Self-pay

## 2019-07-11 VITALS — Temp 98.5°F | Ht 69.4 in

## 2019-07-11 DIAGNOSIS — D518 Other vitamin B12 deficiency anemias: Secondary | ICD-10-CM

## 2019-07-11 DIAGNOSIS — E538 Deficiency of other specified B group vitamins: Secondary | ICD-10-CM | POA: Diagnosis not present

## 2019-07-11 MED ORDER — CYANOCOBALAMIN 1000 MCG/ML IJ SOLN
1000.0000 ug | Freq: Once | INTRAMUSCULAR | Status: AC
  Administered 2019-07-11: 1000 ug via INTRAMUSCULAR

## 2019-07-12 ENCOUNTER — Encounter: Payer: 59 | Admitting: Nurse Practitioner

## 2019-07-18 ENCOUNTER — Other Ambulatory Visit: Payer: Self-pay

## 2019-07-18 ENCOUNTER — Ambulatory Visit (INDEPENDENT_AMBULATORY_CARE_PROVIDER_SITE_OTHER): Payer: 59

## 2019-07-18 VITALS — BP 136/76 | HR 85 | Ht 69.4 in | Wt >= 6400 oz

## 2019-07-18 DIAGNOSIS — E538 Deficiency of other specified B group vitamins: Secondary | ICD-10-CM | POA: Diagnosis not present

## 2019-07-18 MED ORDER — CYANOCOBALAMIN 1000 MCG/ML IJ SOLN
1000.0000 ug | Freq: Once | INTRAMUSCULAR | Status: AC
  Administered 2019-07-18: 1000 ug via INTRAMUSCULAR

## 2019-07-18 NOTE — Progress Notes (Signed)
Pt was seen today to get b12 injection. Injection given and pt is fine

## 2019-07-25 ENCOUNTER — Other Ambulatory Visit: Payer: Self-pay

## 2019-07-25 ENCOUNTER — Ambulatory Visit (INDEPENDENT_AMBULATORY_CARE_PROVIDER_SITE_OTHER): Payer: 59

## 2019-07-25 VITALS — BP 138/84 | HR 64 | Temp 98.4°F | Ht 69.4 in | Wt >= 6400 oz

## 2019-07-25 DIAGNOSIS — E538 Deficiency of other specified B group vitamins: Secondary | ICD-10-CM | POA: Diagnosis not present

## 2019-07-25 MED ORDER — CYANOCOBALAMIN 1000 MCG/ML IJ SOLN
1000.0000 ug | Freq: Once | INTRAMUSCULAR | Status: AC
  Administered 2019-07-25: 1000 ug via INTRAMUSCULAR

## 2019-07-25 NOTE — Progress Notes (Signed)
Pt presents today for b-12 shot

## 2019-07-30 NOTE — Progress Notes (Addendum)
This visit occurred during the SARS-CoV-2 public health emergency.  Safety protocols were in place, including screening questions prior to the visit, additional usage of staff PPE, and extensive cleaning of exam room while observing appropriate contact time as indicated for disinfecting solutions.  Subjective:     Patient ID: Kendra Haney , female    DOB: March 06, 1961 , 59 y.o.   MRN: [id number]   Chief Complaint  Patient presents with  . Weight Check    HPI  Here for weight check  She is not taking any medications.  Wt Readings from Last 3 Encounters: 07/31/19 : (!) 424 lb 13.2 oz (192.7 kg) 07/25/19 : (!) 424 lb 12.8 oz (192.7 kg) 07/18/19 : (!) 422 lb 12.8 oz (191.8 kg)  In December weight was 423 lbs.    Goals are to be more physically active, enjoys wanting to go to the gym but due to the pandemic and being a mouth breather she does not want to go to the gym.  She lost one of her best friends at the end of the year. She is a comfort eater.  She is thinking about 15 minutes two times a day.  She reports having a safe neighborhood to walk in.  She is being seen by a psychiatry provider who is managing well.     Good day - she eats fresh fruits and vegetables.  Not a meat eater. Does not eat beef or chicken.  Likes fish okay but does not typically eat.  She is looking into the [employer].        Past Medical History:  Diagnosis Date  . Anxiety   . Arthritis   . Asthma    greater than 20 years ago   . Cancer ([place])    hx of skin cancer on back   . Complication of anesthesia    Nausea  & vomiting during eye surgery  . Depression   . DVT (deep venous thrombosis) ([place])    12/2011   . Family history of adverse reaction to anesthesia    sister has problems with nausea and vomiting   . GERD (gastroesophageal reflux disease)   . H/O varicella   . Hydradenitis 04/14/11  . Hypertension    hx of hypertension no longer on meds   . Menopausal symptoms 10/14/10  . Menses,  irregular 07/02/10  . PONV (postoperative nausea and vomiting)   . Vulvar itching 04/14/11  . Yeast infection      Family History  Problem Relation Age of Onset  . Hypertension Paternal Grandmother   . Cancer Father   . Hypertension Mother   . Breast cancer Neg Hx      Current Outpatient Medications:  .  acetaminophen (TYLENOL) 500 MG tablet, Take 650 mg by mouth every 6 (six) hours as needed for mild pain. , Disp: , Rfl:  .  buPROPion (WELLBUTRIN XL) 150 MG 24 hr tablet, Take 450 mg by mouth every morning. Pt takes 3 tabs daily., Disp: , Rfl:  .  ferrous sulfate 325 (65 FE) MG EC tablet, Take 1 tablet (325 mg total) by mouth 3 (three) times daily with meals., Disp: 90 tablet, Rfl: 3 .  fexofenadine (ALLEGRA) 180 MG tablet, Take 180 mg by mouth daily., Disp: , Rfl:  .  ondansetron (ZOFRAN) 4 MG tablet, Take 1 tablet (4 mg total) by mouth daily as needed for nausea or vomiting., Disp: 30 tablet, Rfl: 1 .  traZODone (DESYREL) 100 MG tablet, Take 100  mg by mouth at bedtime as needed for sleep. , Disp: , Rfl:  .  Vitamin D, Ergocalciferol, (DRISDOL) 1.25 MG (50000 UT) CAPS capsule, Take 1 capsule (50,000 Units total) by mouth 2 (two) times a week., Disp: 24 capsule, Rfl: 1 .  vortioxetine HBr (TRINTELLIX) 20 MG TABS tablet, Take 20 mg by mouth daily., Disp: , Rfl:  .  Phentermine-Topiramate (QSYMIA) 3.75-23 MG CP24, Take 1 tablet by mouth daily., Disp: 30 capsule, Rfl: 2  Current Facility-Administered Medications:  .  albuterol (PROVENTIL HFA;VENTOLIN HFA) 108 (90 Base) MCG/ACT inhaler 2 puff, 2 puff, Inhalation, Once, Minette Brine, FNP .  triamcinolone acetonide (KENALOG-40) injection 60 mg, 60 mg, Intramuscular, Once, Minette Brine, FNP   Allergies  Allergen Reactions  . Doxycycline Nausea And Vomiting  . Dust Mite Extract     UNKNOWN  . Mold Extract [Trichophyton Mentagrophyte]     UNKNOWN  . Nsaids Other (See Comments)    GI UPSET   . Tetracyclines & Related Nausea And Vomiting      Review of Systems  Constitutional: Negative.  Negative for fatigue and fever.  Respiratory: Negative.   Cardiovascular: Negative.  Negative for chest pain, palpitations and leg swelling.  Gastrointestinal: Negative.   Musculoskeletal: Negative.   Neurological: Negative for dizziness and headaches.  Psychiatric/Behavioral: Negative.  Negative for agitation and confusion.     Today's Vitals   07/31/19 1046  BP: 134/86  Pulse: 64  Temp: 97.9 F (36.6 C)  TempSrc: Oral  Weight: (!) 424 lb 13.2 oz (192.7 kg)  Height: 5' 9.41" (1.763 m)  PainSc: 0-No pain   Body mass index is 62 kg/m.   Objective:  Physical Exam Vitals reviewed.  Constitutional:      General: She is not in acute distress.    Appearance: Normal appearance. She is obese.  Cardiovascular:     Rate and Rhythm: Normal rate and regular rhythm.     Pulses: Normal pulses.     Heart sounds: Normal heart sounds. No murmur.  Pulmonary:     Effort: Pulmonary effort is normal. No respiratory distress.     Breath sounds: Normal breath sounds.  Skin:    Capillary Refill: Capillary refill takes less than 2 seconds.  Neurological:     General: No focal deficit present.     Mental Status: She is alert and oriented to person, place, and time.     Cranial Nerves: No cranial nerve deficit.  Psychiatric:        Mood and Affect: Mood normal.        Behavior: Behavior normal.        Thought Content: Thought content normal.        Judgment: Judgment normal.         Assessment And Plan:     1. Class 3 severe obesity due to excess calories without serious comorbidity with body mass index (BMI) of 60.0 to 69.9 in adult [facility])  She would like to take qsymia  Goal to lose 10 lbs by next visit  Encouraged to walk 10-15 minutes per day - Phentermine-Topiramate (QSYMIA) 3.75-23 MG CP24; Take 1 tablet by mouth daily.  Dispense: 30 capsule; Refill: 2  2. Vitamin B12 deficiency  Will check vitamin B12 levels today and  give 4/4   weekly vitamin b12 injection  Pending results will do monthly or bi-weekly vitamin b12. - Vitamin B12   Minette Brine, FNP    THE PATIENT IS ENCOURAGED TO PRACTICE SOCIAL DISTANCING DUE TO  THE COVID-19 PANDEMIC.

## 2019-07-31 ENCOUNTER — Encounter: Payer: Self-pay | Admitting: Nurse Practitioner

## 2019-07-31 ENCOUNTER — Other Ambulatory Visit: Payer: Self-pay

## 2019-07-31 ENCOUNTER — Ambulatory Visit: Payer: 59 | Admitting: Nurse Practitioner

## 2019-07-31 VITALS — BP 134/86 | HR 64 | Temp 97.9°F | Ht 69.41 in | Wt >= 6400 oz

## 2019-07-31 DIAGNOSIS — E538 Deficiency of other specified B group vitamins: Secondary | ICD-10-CM | POA: Diagnosis not present

## 2019-07-31 DIAGNOSIS — Z6841 Body Mass Index (BMI) 40.0 and over, adult: Secondary | ICD-10-CM

## 2019-07-31 MED ORDER — QSYMIA 3.75-23 MG PO CP24: 1.0000 | ORAL_CAPSULE | Freq: Every day | ORAL | 2 refills | Status: DC

## 2019-07-31 MED ORDER — CYANOCOBALAMIN 1000 MCG/ML IJ SOLN
1000.0000 ug | Freq: Once | INTRAMUSCULAR | Status: AC
  Administered 2019-07-31: 12:00:00 1000 ug via INTRAMUSCULAR

## 2019-07-31 NOTE — Patient Instructions (Signed)

## 2019-08-01 LAB — VITAMIN B12: Vitamin B-12: 510 pg/mL (ref 232–1245)

## 2019-08-24 ENCOUNTER — Encounter: Payer: Self-pay | Admitting: Nurse Practitioner

## 2019-08-31 ENCOUNTER — Ambulatory Visit: Payer: Self-pay | Admitting: Nurse Practitioner

## 2019-09-19 ENCOUNTER — Encounter: Payer: Self-pay | Admitting: Nurse Practitioner

## 2019-09-28 ENCOUNTER — Ambulatory Visit: Payer: 59 | Admitting: Nurse Practitioner

## 2020-05-24 ENCOUNTER — Other Ambulatory Visit: Payer: Self-pay | Admitting: Internal Medicine

## 2020-05-24 DIAGNOSIS — Z1231 Encounter for screening mammogram for malignant neoplasm of breast: Secondary | ICD-10-CM

## 2020-05-29 ENCOUNTER — Encounter: Payer: Self-pay | Admitting: Nurse Practitioner

## 2020-06-24 ENCOUNTER — Encounter: Payer: Self-pay | Admitting: Nurse Practitioner

## 2020-06-28 ENCOUNTER — Encounter: Payer: Self-pay | Admitting: Nurse Practitioner

## 2020-07-01 ENCOUNTER — Encounter: Payer: Self-pay | Admitting: Nurse Practitioner

## 2020-07-03 ENCOUNTER — Ambulatory Visit
Admission: RE | Admit: 2020-07-03 | Discharge: 2020-07-03 | Disposition: A | Discharge status: Home / Self Care | Payer: Self-pay | Source: Ambulatory Visit | Attending: Internal Medicine | Admitting: Internal Medicine

## 2020-07-03 ENCOUNTER — Other Ambulatory Visit: Payer: Self-pay

## 2020-07-03 DIAGNOSIS — Z1231 Encounter for screening mammogram for malignant neoplasm of breast: Secondary | ICD-10-CM

## 2020-07-08 ENCOUNTER — Ambulatory Visit: Payer: 59 | Admitting: Nurse Practitioner

## 2020-07-16 ENCOUNTER — Encounter: Payer: Self-pay | Admitting: Nurse Practitioner

## 2020-07-16 ENCOUNTER — Telehealth (INDEPENDENT_AMBULATORY_CARE_PROVIDER_SITE_OTHER): Payer: 59 | Admitting: Nurse Practitioner

## 2020-07-16 ENCOUNTER — Other Ambulatory Visit: Payer: Self-pay

## 2020-07-16 VITALS — Ht 70.0 in | Wt >= 6400 oz

## 2020-07-16 DIAGNOSIS — R059 Cough, unspecified: Secondary | ICD-10-CM

## 2020-07-16 DIAGNOSIS — U071 COVID-19: Secondary | ICD-10-CM | POA: Diagnosis not present

## 2020-07-16 MED ORDER — ALBUTEROL SULFATE HFA 108 (90 BASE) MCG/ACT IN AERS: 2.0000 | INHALATION_SPRAY | Freq: Four times a day (QID) | RESPIRATORY_TRACT | 2 refills | Status: DC | PRN

## 2020-07-16 MED ORDER — PREDNISONE 5 MG (21) PO TBPK: ORAL_TABLET | ORAL | 0 refills | Status: DC

## 2020-07-16 NOTE — Progress Notes (Signed)
Virtual Visit via MyChart   This visit type was conducted due to national recommendations for restrictions regarding the COVID-19 Pandemic (e.g. social distancing) in an effort to limit this patient's exposure and mitigate transmission in our community.  Due to her co-morbid illnesses, this patient is at least at moderate risk for complications without adequate follow up.  This format is felt to be most appropriate for this patient at this time.  All issues noted in this document were discussed and addressed.  A limited physical exam was performed with this format.    This visit type was conducted due to national recommendations for restrictions regarding the COVID-19 Pandemic (e.g. social distancing) in an effort to limit this patient's exposure and mitigate transmission in our community.  Patients identity confirmed using two different identifiers.  This format is felt to be most appropriate for this patient at this time.  All issues noted in this document were discussed and addressed.  No physical exam was performed (except for noted visual exam findings with Video Visits).    Date:  08/04/2020   ID:  Kendra Haney, DOB [birth year], MRN [id number]  Patient Location:  Home  Provider location:   Office    Chief Complaint:    History of Present Illness:    Kendra Haney is a 60 y.o. female who presents via video conferencing for a telehealth visit today.    The patient does have symptoms concerning for COVID-19 infection (fever, chills, cough, or new shortness of breath).   She was tested with a rapid covid test at home the Thursday before Christmas, she had allergy symptoms with some fatigue. She thinks her mother has it.  she is fully vaccinated.  She is day 10 of covid, she will get winded and have a dry cough.  Feels like everything closes up. She had been better, she lost her sense of taste and smell. Occurs anytime of the day. A short walk will cause her to cough. She works a split  schedule working from home and going in person.      Past Medical History:  Diagnosis Date  . Anxiety   . Arthritis   . Asthma    greater than 20 years ago   . Cancer (HCC)    hx of skin cancer on back   . Complication of anesthesia    Nausea  & vomiting during eye surgery  . Depression   . DVT (deep venous thrombosis) (HCC)    12/2011   . Family history of adverse reaction to anesthesia    sister has problems with nausea and vomiting   . GERD (gastroesophageal reflux disease)   . H/O varicella   . Hydradenitis 04/14/11  . Hypertension    hx of hypertension no longer on meds   . Menopausal symptoms 10/14/10  . Menses, irregular 07/02/10  . PONV (postoperative nausea and vomiting)   . Vulvar itching 04/14/11  . Yeast infection    Past Surgical History:  Procedure Laterality Date  . 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 TONSILLECTOMY     60 years old  . TOTAL KNEE ARTHROPLASTY Bilateral 10/24/2014   Procedure: TOTAL KNEE BILATERAL;  Surgeon: Ollen Gross, MD;  Location: [facility];  Service: Orthopedics;  Laterality: Bilateral;  . WISDOM TOOTH EXTRACTION       Current Meds  Medication Sig  . acetaminophen (TYLENOL) 500 MG tablet Take 650 mg by mouth every 6 (six) hours  as needed for mild pain.   Marland Kitchen albuterol (VENTOLIN HFA) 108 (90 Base) MCG/ACT inhaler Inhale 2 puffs into the lungs every 6 (six) hours as needed for wheezing or shortness of breath.  Marland Kitchen buPROPion (WELLBUTRIN XL) 150 MG 24 hr tablet Take 450 mg by mouth every morning. Pt takes 3 tabs daily.  . fexofenadine (ALLEGRA) 180 MG tablet Take 180 mg by mouth daily.  Marland Kitchen omeprazole (PRILOSEC) 20 MG capsule Take 20 mg by mouth daily.  . predniSONE (STERAPRED UNI-PAK 21 TAB) 5 MG (21) TBPK tablet Take as directed  . Probiotic Product (PROBIOTIC DAILY PO) Take 1 capsule by mouth daily at 12 noon.  . traZODone (DESYREL) 100 MG tablet Take 100 mg by mouth at bedtime as needed for sleep.   Marland Kitchen vortioxetine HBr  (TRINTELLIX) 20 MG TABS tablet Take 20 mg by mouth daily.   Current Facility-Administered Medications for the 07/16/20 encounter (Video Visit) with Minette Brine, FNP  Medication  . albuterol (PROVENTIL HFA;VENTOLIN HFA) 108 (90 Base) MCG/ACT inhaler 2 puff  . triamcinolone acetonide (KENALOG-40) injection 60 mg     Allergies:   Doxycycline, Dust mite extract, Mold extract [trichophyton mentagrophyte], Nsaids, and Tetracyclines & related   Social History   Tobacco Use  . Smoking status: Never Smoker  . Smokeless tobacco: Never Used  Substance Use Topics  . Alcohol use: No  . Drug use: No     Family Hx: The patient's family history includes Cancer in her father; Hypertension in her mother and paternal grandmother. There is no history of Breast cancer.  ROS:   Please see the history of present illness.    ROS  All other systems reviewed and are negative.   Labs/Other Tests and Data Reviewed:    Recent Labs: No results found for requested labs within last 8760 hours.   Recent Lipid Panel Lab Results  Component Value Date/Time   CHOL 173 06/21/2019 03:08 PM   TRIG 73 06/21/2019 03:08 PM   HDL 73 06/21/2019 03:08 PM   CHOLHDL 2.4 06/21/2019 03:08 PM   LDLCALC 86 06/21/2019 03:08 PM    Wt Readings from Last 3 Encounters:  07/16/20 (!) 400 lb (181.4 kg)  07/31/19 (!) 424 lb 13.2 oz (192.7 kg)  07/25/19 (!) 424 lb 12.8 oz (192.7 kg)     Exam:    Vital Signs:  Ht 5\' 10"  (1.778 m)   Wt (!) 400 lb (181.4 kg)   BMI 57.39 kg/m     Physical Exam Constitutional:      General: She is not in acute distress.    Appearance: She is obese.  Pulmonary:     Effort: Pulmonary effort is normal. No respiratory distress.  Neurological:     General: No focal deficit present.     Mental Status: She is alert and oriented to person, place, and time.     Cranial Nerves: No cranial nerve deficit.  Psychiatric:        Mood and Affect: Mood normal.        Behavior: Behavior normal.         Thought Content: Thought content normal.        Judgment: Judgment normal.     ASSESSMENT & PLAN:    1. Cough  Continues to have a cough since being positive for covid, will treat with prednisone and albuterol inhaler - predniSONE (STERAPRED UNI-PAK 21 TAB) 5 MG (21) TBPK tablet; Take as directed  Dispense: 21 tablet; Refill: 0 - albuterol (VENTOLIN  HFA) 108 (90 Base) MCG/ACT inhaler; Inhale 2 puffs into the lungs every 6 (six) hours as needed for wheezing or shortness of breath.  Dispense: 8 g; Refill: 2  2. Lab test positive for detection of COVID-19 virus    COVID-19 Education: The signs and symptoms of COVID-19 were discussed with the patient and how to seek care for testing (follow up with PCP or arrange E-visit).  The importance of social distancing was discussed today.  Patient Risk:   After full review of this patients clinical status, I feel that they are at least moderate risk at this time.  Time:   Today, I have spent 12 minutes/ seconds with the patient with telehealth technology discussing above diagnoses.     Medication Adjustments/Labs and Tests Ordered: Current medicines are reviewed at length with the patient today.  Concerns regarding medicines are outlined above.   Tests Ordered: No orders of the defined types were placed in this encounter.   Medication Changes: Meds ordered this encounter  Medications  . predniSONE (STERAPRED UNI-PAK 21 TAB) 5 MG (21) TBPK tablet    Sig: Take as directed    Dispense:  21 tablet    Refill:  0  . albuterol (VENTOLIN HFA) 108 (90 Base) MCG/ACT inhaler    Sig: Inhale 2 puffs into the lungs every 6 (six) hours as needed for wheezing or shortness of breath.    Dispense:  8 g    Refill:  2    Disposition:  Follow up prn  Signed, Arnette Felts, FNP

## 2020-07-24 ENCOUNTER — Other Ambulatory Visit: Payer: Self-pay | Admitting: Nurse Practitioner

## 2020-07-24 ENCOUNTER — Encounter: Payer: Self-pay | Admitting: Nurse Practitioner

## 2020-07-24 DIAGNOSIS — R059 Cough, unspecified: Secondary | ICD-10-CM

## 2020-07-26 ENCOUNTER — Ambulatory Visit: Payer: 59

## 2020-07-26 ENCOUNTER — Encounter: Payer: Self-pay | Admitting: Nurse Practitioner

## 2020-07-26 ENCOUNTER — Other Ambulatory Visit: Payer: Self-pay

## 2020-07-26 MED ORDER — BENZONATATE 100 MG PO CAPS: 100.0000 mg | ORAL_CAPSULE | Freq: Four times a day (QID) | ORAL | 1 refills | Status: DC | PRN

## 2020-07-30 ENCOUNTER — Ambulatory Visit: Payer: 59 | Admitting: Nurse Practitioner

## 2020-07-30 VITALS — HR 52 | Temp 97.7°F

## 2020-07-30 DIAGNOSIS — R059 Cough, unspecified: Secondary | ICD-10-CM

## 2020-07-30 DIAGNOSIS — U071 COVID-19: Secondary | ICD-10-CM | POA: Insufficient documentation

## 2020-07-30 MED ORDER — AZITHROMYCIN 250 MG PO TABS: ORAL_TABLET | ORAL | 0 refills | Status: DC

## 2020-07-30 MED ORDER — BUDESONIDE-FORMOTEROL FUMARATE 80-4.5 MCG/ACT IN AERO: 2.0000 | INHALATION_SPRAY | Freq: Two times a day (BID) | RESPIRATORY_TRACT | 3 refills | Status: DC

## 2020-07-30 NOTE — Patient Instructions (Addendum)
History of Covid 19:   Stay well hydrated  Stay active  Deep breathing exercises  May take tylenol or fever or pain  May take mucinex DM twice daily  May start Pepcid twice daily  Will order chest x ray:  [facility] [address], [place] [address] [phone] MON - FRI 8:00 AM - 4:00 PM - WALK IN  Cough:  Continue gastroesophageal reflux disease treatment with elevating the head your bed and taking antacids Continue taking over-the-counter antihistamines to help with allergic rhinitis You need to try to suppress your cough to allow your larynx (voice box) to heal.  For three days don't talk, laugh, sing, or clear your throat. Do everything you can to suppress the cough during this time. Use hard candies (sugarless Jolly Ranchers) or non-mint or non-menthol containing cough drops during this time to soothe your throat.   Take sips of water when you feel the need to cough   Follow up:  Follow up in 2 weeks or sooner if needed

## 2020-07-30 NOTE — Progress Notes (Signed)
@Patient  ID: Kendra Haney, female    DOB: August 20, 1960, 60 y.o.   MRN: [id number]  Chief Complaint  Patient presents with  . Covid Positive    Cough     Referring provider: Minette Brine, FNP   60 year old female with history of hypertension, asthma, GERD  HPI  Patient presents today for post-COVID care clinic visit.  She was diagnosed with COVID on 07/06/2020.  She has been seen by her primary care physician through video visit and has finished a round of prednisone.  She was also prescribed albuterol inhaler as needed and Tessalon Perles.  Patient states that she continues to have significant cough with exertion.  She does notice herself wheezing at times.  She does have remote history of asthma.  She has not needed to use inhaler in several years.  She is trying to stay active and well-hydrated.  Patient has not had any imaging done since diagnosed with COVID. Denies f/c/s, n/v/d, hemoptysis, PND, chest pain or edema.       Allergies  Allergen Reactions  . Doxycycline Nausea And Vomiting  . Dust Mite Extract     UNKNOWN  . Mold Extract [Trichophyton Mentagrophyte]     UNKNOWN  . Nsaids Other (See Comments)    GI UPSET   . Tetracyclines & Related Nausea And Vomiting    Immunization History  Administered Date(s) Administered  . Influenza,inj,Quad PF,6+ Mos 06/21/2019  . Influenza-Unspecified 04/09/2018  . PFIZER(Purple Top)SARS-COV-2 Vaccination 09/30/2019, 10/27/2019  . Tdap 05/19/2010    Past Medical History:  Diagnosis Date  . Anxiety   . Arthritis   . Asthma    greater than 20 years ago   . Cancer ([place])    hx of skin cancer on back   . Complication of anesthesia    Nausea  & vomiting during eye surgery  . Depression   . DVT (deep venous thrombosis) ([place])    12/2011   . Family history of adverse reaction to anesthesia    sister has problems with nausea and vomiting   . GERD (gastroesophageal reflux disease)   . H/O varicella   . Hydradenitis 04/14/11  .  Hypertension    hx of hypertension no longer on meds   . Menopausal symptoms 10/14/10  . Menses, irregular 07/02/10  . PONV (postoperative nausea and vomiting)   . Vulvar itching 04/14/11  . Yeast infection     Tobacco History: Social History   Tobacco Use  Smoking Status Never Smoker  Smokeless Tobacco Never Used   Counseling given: Yes   Outpatient Encounter Medications as of 07/30/2020  Medication Sig  . azithromycin (ZITHROMAX) 250 MG tablet Take 2 tablets (500 mg) on day 1, then take 1 tablet (250 mg) on days 2-5  . budesonide-formoterol (SYMBICORT) 80-4.5 MCG/ACT inhaler Inhale 2 puffs into the lungs 2 (two) times daily.  Marland Kitchen acetaminophen (TYLENOL) 500 MG tablet Take 650 mg by mouth every 6 (six) hours as needed for mild pain.   Marland Kitchen albuterol (VENTOLIN HFA) 108 (90 Base) MCG/ACT inhaler Inhale 2 puffs into the lungs every 6 (six) hours as needed for wheezing or shortness of breath.  . benzonatate (TESSALON PERLES) 100 MG capsule Take 1 capsule (100 mg total) by mouth every 6 (six) hours as needed for cough.  Marland Kitchen buPROPion (WELLBUTRIN XL) 150 MG 24 hr tablet Take 450 mg by mouth every morning. Pt takes 3 tabs daily.  . fexofenadine (ALLEGRA) 180 MG tablet Take 180 mg by mouth daily.  Marland Kitchen  omeprazole (PRILOSEC) 20 MG capsule Take 20 mg by mouth daily.  . predniSONE (STERAPRED UNI-PAK 21 TAB) 5 MG (21) TBPK tablet Take as directed  . Probiotic Product (PROBIOTIC DAILY PO) Take 1 capsule by mouth daily at 12 noon.  . traZODone (DESYREL) 100 MG tablet Take 100 mg by mouth at bedtime as needed for sleep.   Marland Kitchen vortioxetine HBr (TRINTELLIX) 20 MG TABS tablet Take 20 mg by mouth daily.   Facility-Administered Encounter Medications as of 07/30/2020  Medication  . albuterol (PROVENTIL HFA;VENTOLIN HFA) 108 (90 Base) MCG/ACT inhaler 2 puff  . triamcinolone acetonide (KENALOG-40) injection 60 mg     Review of Systems  Review of Systems  Constitutional: Negative.  Negative for fatigue and  fever.  HENT: Negative.   Respiratory: Positive for cough and wheezing. Negative for shortness of breath.   Cardiovascular: Negative.  Negative for chest pain, palpitations and leg swelling.  Gastrointestinal: Negative.   Allergic/Immunologic: Negative.   Neurological: Negative.   Psychiatric/Behavioral: Negative.        Physical Exam  Pulse (!) 52   Temp 97.7 F (36.5 C)   SpO2 98% Comment: RA  Wt Readings from Last 5 Encounters:  07/16/20 (!) 400 lb (181.4 kg)  07/31/19 (!) 424 lb 13.2 oz (192.7 kg)  07/25/19 (!) 424 lb 12.8 oz (192.7 kg)  07/18/19 (!) 422 lb 12.8 oz (191.8 kg)  06/21/19 (!) 423 lb 6.4 oz (192.1 kg)     Physical Exam Vitals and nursing note reviewed.  Constitutional:      General: She is not in acute distress.    Appearance: She is well-developed and well-nourished.  Cardiovascular:     Rate and Rhythm: Normal rate and regular rhythm.  Pulmonary:     Effort: Pulmonary effort is normal.     Breath sounds: Normal breath sounds.  Musculoskeletal:     Right lower leg: No edema.     Left lower leg: No edema.  Neurological:     Mental Status: She is alert and oriented to person, place, and time.  Psychiatric:        Mood and Affect: Mood and affect and mood normal.        Behavior: Behavior normal.      Imaging: MM DIGITAL SCREENING BILATERAL  Result Date: 07/08/2020 CLINICAL DATA:  Screening. EXAM: DIGITAL SCREENING BILATERAL MAMMOGRAM WITH CAD COMPARISON:  Previous exam(s). ACR Breast Density Category a: The breast tissue is almost entirely fatty. FINDINGS: There are no findings suspicious for malignancy. Images were processed with CAD. IMPRESSION: No mammographic evidence of malignancy. A result letter of this screening mammogram will be mailed directly to the patient. RECOMMENDATION: Screening mammogram in one year. (Code:SM-B-01Y) BI-RADS CATEGORY  1: Negative. Electronically Signed   By: Lajean Manes [name]   On: 07/08/2020 15:53      Assessment & Plan:   COVID-19 Stay well hydrated  Stay active  Deep breathing exercises  May take tylenol or fever or pain  May take mucinex DM twice daily  May start Pepcid twice daily  Will order chest x ray:  [facility] [address],  [address] [phone] MON - FRI 8:00 AM - 4:00 PM - WALK IN  Cough:  Continue gastroesophageal reflux disease treatment with elevating the head your bed and taking antacids Continue taking over-the-counter antihistamines to help with allergic rhinitis You need to try to suppress your cough to allow your larynx (voice box) to heal.  For three days don't talk,  laugh, sing, or clear your throat. Do everything you can to suppress the cough during this time. Use hard candies (sugarless Jolly Ranchers) or non-mint or non-menthol containing cough drops during this time to soothe your throat.   Take sips of water when you feel the need to cough       Fenton Foy, NP 07/30/2020

## 2020-07-30 NOTE — Assessment & Plan Note (Signed)
Stay well hydrated  Stay active  Deep breathing exercises  May take tylenol or fever or pain  May take mucinex DM twice daily  May start Pepcid twice daily  Will order chest x ray:  [facility] [address], [place] [address] [phone] MON - FRI 8:00 AM - 4:00 PM - WALK IN  Cough:  Continue gastroesophageal reflux disease treatment with elevating the head your bed and taking antacids Continue taking over-the-counter antihistamines to help with allergic rhinitis You need to try to suppress your cough to allow your larynx (voice box) to heal.  For three days don't talk, laugh, sing, or clear your throat. Do everything you can to suppress the cough during this time. Use hard candies (sugarless Jolly Ranchers) or non-mint or non-menthol containing cough drops during this time to soothe your throat.   Take sips of water when you feel the need to cough

## 2020-07-31 ENCOUNTER — Ambulatory Visit
Admission: RE | Admit: 2020-07-31 | Discharge: 2020-07-31 | Disposition: A | Discharge status: Home / Self Care | Payer: 59 | Source: Ambulatory Visit | Attending: Nurse Practitioner | Admitting: Nurse Practitioner

## 2020-07-31 ENCOUNTER — Encounter: Payer: Self-pay | Admitting: Nurse Practitioner

## 2020-07-31 ENCOUNTER — Other Ambulatory Visit: Payer: Self-pay

## 2020-07-31 DIAGNOSIS — R059 Cough, unspecified: Secondary | ICD-10-CM

## 2020-08-02 ENCOUNTER — Other Ambulatory Visit: Payer: Self-pay | Admitting: Nurse Practitioner

## 2020-08-02 DIAGNOSIS — I700 Atherosclerosis of aorta: Secondary | ICD-10-CM

## 2020-08-02 DIAGNOSIS — R9389 Abnormal findings on diagnostic imaging of other specified body structures: Secondary | ICD-10-CM

## 2020-08-04 ENCOUNTER — Encounter: Payer: Self-pay | Admitting: Nurse Practitioner

## 2020-08-16 ENCOUNTER — Encounter: Payer: Self-pay | Admitting: Nurse Practitioner

## 2020-08-19 ENCOUNTER — Other Ambulatory Visit: Payer: Self-pay

## 2020-08-19 MED ORDER — BENZONATATE 100 MG PO CAPS: 100.0000 mg | ORAL_CAPSULE | Freq: Four times a day (QID) | ORAL | 1 refills | Status: DC | PRN

## 2020-09-08 NOTE — Progress Notes (Deleted)
Cardiology Office Note:    Date:  09/08/2020   ID:  Kendra Haney, DOB [birth year], MRN [id number]  PCP:  Minette Brine, FNP  Cardiologist:  No primary care provider on file.  Electrophysiologist:  None   Referring MD: Minette Brine, FNP   No chief complaint on file. ***  History of Present Illness:    Kendra Haney is a 60 y.o. female with a hx of asthma, DVT, hypertension, COVID-19 infection December 2021 who is referred by Minette Brine, FNP for evaluation of aortic calcification.  Chest x-ray on 07/31/2020 showed no active cardiopulmonary disease but borderline cardiomegaly and aortic calcification noted.  Past Medical History:  Diagnosis Date  . Anxiety   . Arthritis   . Asthma    greater than 20 years ago   . Cancer ([place])    hx of skin cancer on back   . Complication of anesthesia    Nausea  & vomiting during eye surgery  . Depression   . DVT (deep venous thrombosis) ([place])    12/2011   . Family history of adverse reaction to anesthesia    sister has problems with nausea and vomiting   . GERD (gastroesophageal reflux disease)   . H/O varicella   . Hydradenitis 04/14/11  . Hypertension    hx of hypertension no longer on meds   . Menopausal symptoms 10/14/10  . Menses, irregular 07/02/10  . PONV (postoperative nausea and vomiting)   . Vulvar itching 04/14/11  . Yeast infection     Past Surgical History:  Procedure Laterality Date  . 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 TONSILLECTOMY     60 years old  . TOTAL KNEE ARTHROPLASTY Bilateral 10/24/2014   Procedure: TOTAL KNEE BILATERAL;  Surgeon: Gaynelle Arabian, MD;  Location: [facility];  Service: Orthopedics;  Laterality: Bilateral;  . WISDOM TOOTH EXTRACTION      Current Medications: No outpatient medications have been marked as taking for the 09/09/20 encounter (Appointment) with Donato Heinz, MD.   Current Facility-Administered Medications for the 09/09/20 encounter (Appointment) with Donato Heinz, MD  Medication  . albuterol (PROVENTIL HFA;VENTOLIN HFA) 108 (90 Base) MCG/ACT inhaler 2 puff  . triamcinolone acetonide (KENALOG-40) injection 60 mg     Allergies:   Doxycycline, Dust mite extract, Mold extract [trichophyton mentagrophyte], Nsaids, and Tetracyclines & related   Social History   Socioeconomic History  . Marital status: Single    Spouse name: Not on file  . Number of children: Not on file  . Years of education: Not on file  . Highest education level: Not on file  Occupational History  . Not on file  Tobacco Use  . Smoking status: Never Smoker  . Smokeless tobacco: Never Used  Substance and Sexual Activity  . Alcohol use: No  . Drug use: No  . Sexual activity: Never    Birth control/protection: Abstinence, None  Other Topics Concern  . Not on file  Social History Narrative  . Not on file   Social Determinants of Health   Financial Resource Strain: Not on file  Food Insecurity: Not on file  Transportation Needs: Not on file  Physical Activity: Not on file  Stress: Not on file  Social Connections: Not on file     Family History: The patient's ***family history includes Cancer in her father; Hypertension in her mother and paternal grandmother. There is no history of Breast cancer.  ROS:   Please  see the history of present illness.    *** All other systems reviewed and are negative.  EKGs/Labs/Other Studies Reviewed:    The following studies were reviewed today: ***  EKG:  EKG is *** ordered today.  The ekg ordered today demonstrates ***  Recent Labs: No results found for requested labs within last 8760 hours.  Recent Lipid Panel    Component Value Date/Time   CHOL 173 06/21/2019 1508   TRIG 73 06/21/2019 1508   HDL 73 06/21/2019 1508   CHOLHDL 2.4 06/21/2019 1508   LDLCALC 86 06/21/2019 1508    Physical Exam:    VS:  There were no vitals taken for this visit.    Wt Readings from Last 3 Encounters:  07/16/20 (!) 400 lb  (181.4 kg)  07/31/19 (!) 424 lb 13.2 oz (192.7 kg)  07/25/19 (!) 424 lb 12.8 oz (192.7 kg)     GEN: *** Well nourished, well developed in no acute distress HEENT: Normal NECK: No JVD; No carotid bruits LYMPHATICS: No lymphadenopathy CARDIAC: ***RRR, no murmurs, rubs, gallops RESPIRATORY:  Clear to auscultation without rales, wheezing or rhonchi  ABDOMEN: Soft, non-tender, non-distended MUSCULOSKELETAL:  No edema; No deformity  SKIN: Warm and dry NEUROLOGIC:  Alert and oriented x 3 PSYCHIATRIC:  Normal affect   ASSESSMENT:    No diagnosis found. PLAN:    In order of problems listed above:  1. ***   Medication Adjustments/Labs and Tests Ordered: Current medicines are reviewed at length with the patient today.  Concerns regarding medicines are outlined above.  No orders of the defined types were placed in this encounter.  No orders of the defined types were placed in this encounter.   There are no Patient Instructions on file for this visit.   Signed, Donato Heinz, MD  09/08/2020 11:21 PM    [place]

## 2020-09-09 ENCOUNTER — Ambulatory Visit (INDEPENDENT_AMBULATORY_CARE_PROVIDER_SITE_OTHER): Payer: 59 | Admitting: Nurse Practitioner

## 2020-09-09 ENCOUNTER — Encounter: Payer: Self-pay | Admitting: Nurse Practitioner

## 2020-09-09 ENCOUNTER — Ambulatory Visit: Payer: 59 | Admitting: Cardiology

## 2020-09-09 VITALS — BP 130/80 | HR 78 | Temp 97.1°F

## 2020-09-09 DIAGNOSIS — R197 Diarrhea, unspecified: Secondary | ICD-10-CM | POA: Diagnosis not present

## 2020-09-09 DIAGNOSIS — R059 Cough, unspecified: Secondary | ICD-10-CM

## 2020-09-09 DIAGNOSIS — R112 Nausea with vomiting, unspecified: Secondary | ICD-10-CM

## 2020-09-09 LAB — POC COVID19 BINAXNOW: SARS Coronavirus 2 Ag: NEGATIVE

## 2020-09-09 LAB — POCT INFLUENZA A/B
Influenza A, POC: NEGATIVE
Influenza B, POC: NEGATIVE

## 2020-09-09 MED ORDER — ONDANSETRON HCL 4 MG PO TABS: 4.0000 mg | ORAL_TABLET | Freq: Every day | ORAL | 1 refills | Status: DC | PRN

## 2020-09-09 NOTE — Progress Notes (Signed)
This visit occurred during the SARS-CoV-2 public health emergency.  Safety protocols were in place, including screening questions prior to the visit, additional usage of staff PPE, and extensive cleaning of exam room while observing appropriate contact time as indicated for disinfecting solutions.  Subjective:     Patient ID: Kendra Haney , female    DOB: June 09, 1961 , 60 y.o.   MRN: [id number]   Chief Complaint  Patient presents with  . Headache  . Nausea  . Diarrhea    HPI  Emesis  This is a recurrent problem. The current episode started yesterday. The problem occurs 5 to 10 times per day. The problem has been unchanged. The emesis has an appearance of bile. Associated symptoms include coughing. Pertinent negatives include no abdominal pain, chest pain, dizziness or headaches. Risk factors: unsure she had chicken and [place] the day before on Saturday.     Past Medical History:  Diagnosis Date  . Anxiety   . Arthritis   . Asthma    greater than 20 years ago   . Cancer ([place])    hx of skin cancer on back   . Complication of anesthesia    Nausea  & vomiting during eye surgery  . Depression   . DVT (deep venous thrombosis) ([place])    12/2011   . Family history of adverse reaction to anesthesia    sister has problems with nausea and vomiting   . GERD (gastroesophageal reflux disease)   . H/O varicella   . Hydradenitis 04/14/11  . Hypertension    hx of hypertension no longer on meds   . Menopausal symptoms 10/14/10  . Menses, irregular 07/02/10  . PONV (postoperative nausea and vomiting)   . Vulvar itching 04/14/11  . Yeast infection      Family History  Problem Relation Age of Onset  . Hypertension Paternal Grandmother   . Cancer Father   . Hypertension Mother   . Breast cancer Neg Hx      Current Outpatient Medications:  .  acetaminophen (TYLENOL) 500 MG tablet, Take 650 mg by mouth every 6 (six) hours as needed for mild pain. , Disp: , Rfl:  .  benzonatate (TESSALON  PERLES) 100 MG capsule, Take 1 capsule (100 mg total) by mouth every 6 (six) hours as needed for cough., Disp: 30 capsule, Rfl: 1 .  budesonide-formoterol (SYMBICORT) 80-4.5 MCG/ACT inhaler, Inhale 2 puffs into the lungs 2 (two) times daily., Disp: 1 each, Rfl: 3 .  buPROPion (WELLBUTRIN XL) 150 MG 24 hr tablet, Take 450 mg by mouth every morning. Pt takes 3 tabs daily., Disp: , Rfl:  .  fexofenadine (ALLEGRA) 180 MG tablet, Take 180 mg by mouth daily., Disp: , Rfl:  .  omeprazole (PRILOSEC) 20 MG capsule, Take 20 mg by mouth daily., Disp: , Rfl:  .  ondansetron (ZOFRAN) 4 MG tablet, Take 1 tablet (4 mg total) by mouth daily as needed for nausea or vomiting., Disp: 30 tablet, Rfl: 1 .  Probiotic Product (PROBIOTIC DAILY PO), Take 1 capsule by mouth daily at 12 noon., Disp: , Rfl:  .  traZODone (DESYREL) 100 MG tablet, Take 100 mg by mouth at bedtime as needed for sleep. , Disp: , Rfl:  .  vortioxetine HBr (TRINTELLIX) 20 MG TABS tablet, Take 20 mg by mouth daily., Disp: , Rfl:  .  ALPRAZolam (XANAX) 1 MG tablet, alprazolam 1 mg tablet, Disp: , Rfl:  .  amLODipine (NORVASC) 5 MG tablet, Take 1 tablet (5 mg  total) by mouth daily., Disp: 90 tablet, Rfl: 3 .  VRAYLAR capsule, Take 1.5 mg by mouth daily., Disp: , Rfl:   Current Facility-Administered Medications:  .  albuterol (PROVENTIL HFA;VENTOLIN HFA) 108 (90 Base) MCG/ACT inhaler 2 puff, 2 puff, Inhalation, Once, Minette Brine, FNP .  triamcinolone acetonide (KENALOG-40) injection 60 mg, 60 mg, Intramuscular, Once, Minette Brine, FNP   Allergies  Allergen Reactions  . Doxycycline Nausea And Vomiting  . Dust Mite Extract     UNKNOWN  . Mold Extract [Trichophyton Mentagrophyte]     UNKNOWN  . Nsaids Other (See Comments)    GI UPSET   . Tetracyclines & Related Nausea And Vomiting     Review of Systems  Constitutional: Negative.   Respiratory: Positive for cough and wheezing. Negative for shortness of breath.   Cardiovascular: Negative for  chest pain, palpitations and leg swelling.  Gastrointestinal: Positive for vomiting. Negative for abdominal pain.  Neurological: Negative for dizziness and headaches.     Today's Vitals   09/09/20 1200  BP: 130/80  Pulse: 78  Temp: (!) 97.1 F (36.2 C)  TempSrc: Oral   There is no height or weight on file to calculate BMI.   Objective:  Physical Exam Constitutional:      General: She is not in acute distress.    Appearance: She is well-developed. She is obese.  Cardiovascular:     Heart sounds: Normal heart sounds. No murmur heard.   Pulmonary:     Effort: Pulmonary effort is normal. No respiratory distress.     Breath sounds: Normal breath sounds. No wheezing.  Neurological:     Mental Status: She is alert.  Psychiatric:        Mood and Affect: Mood normal. Mood is not anxious.        Speech: Speech normal.        Behavior: Behavior normal.         Assessment And Plan:     1. Cough  Will check her for covid  She is to remain in isolation until she has negative results  Take cough syrup as needed  Negative for flu and rapid covid - Novel Coronavirus, NAA ([facility]) - POC COVID-19 - POCT Influenza A/B  2. Non-intractable vomiting with nausea, unspecified vomiting type  This is improving, will provide Zofran for nausea   BRAT diet, avoid greasy foods - ondansetron (ZOFRAN) 4 MG tablet; Take 1 tablet (4 mg total) by mouth daily as needed for nausea or vomiting.  Dispense: 30 tablet; Refill: 1  3. Diarrhea, unspecified type  Improving, last time was over 24 hours prior to visit     Patient was given opportunity to ask questions. Patient verbalized understanding of the plan and was able to repeat key elements of the plan. All questions were answered to their satisfaction.  Minette Brine, FNP   I, Minette Brine, FNP, have reviewed all documentation for this visit. The documentation on 09/09/20 for the exam, diagnosis, procedures, and orders are all accurate  and complete.   THE PATIENT IS ENCOURAGED TO PRACTICE SOCIAL DISTANCING DUE TO THE COVID-19 PANDEMIC.

## 2020-09-10 LAB — NOVEL CORONAVIRUS, NAA: SARS-CoV-2, NAA: NOT DETECTED

## 2020-09-10 LAB — SARS-COV-2, NAA 2 DAY TAT

## 2020-09-23 ENCOUNTER — Encounter: Payer: Self-pay | Admitting: Cardiology

## 2020-09-23 ENCOUNTER — Other Ambulatory Visit: Payer: Self-pay | Admitting: *Deleted

## 2020-09-23 ENCOUNTER — Ambulatory Visit: Payer: 59 | Admitting: Cardiology

## 2020-09-23 ENCOUNTER — Other Ambulatory Visit: Payer: Self-pay

## 2020-09-23 VITALS — BP 157/85 | HR 71 | Ht 70.0 in | Wt >= 6400 oz

## 2020-09-23 DIAGNOSIS — R0683 Snoring: Secondary | ICD-10-CM | POA: Diagnosis not present

## 2020-09-23 DIAGNOSIS — Z79899 Other long term (current) drug therapy: Secondary | ICD-10-CM

## 2020-09-23 DIAGNOSIS — I517 Cardiomegaly: Secondary | ICD-10-CM

## 2020-09-23 DIAGNOSIS — E785 Hyperlipidemia, unspecified: Secondary | ICD-10-CM | POA: Diagnosis not present

## 2020-09-23 DIAGNOSIS — I10 Essential (primary) hypertension: Secondary | ICD-10-CM | POA: Diagnosis not present

## 2020-09-23 MED ORDER — LOSARTAN POTASSIUM 50 MG PO TABS: 50.0000 mg | ORAL_TABLET | Freq: Every day | ORAL | 3 refills | Status: DC

## 2020-09-23 NOTE — Patient Instructions (Addendum)
Medication Instructions:  START Losartan 50 mg once daily  *If you need a refill on your cardiac medications before your next appointment, please call your pharmacy*   Lab Work: CMET, CBC, TSH, Lipid today   AND  Please return for labs in 2 weeks (BMET)  Our in office lab hours are Monday-Friday 8:00-4:00, closed for lunch 12:45-1:45 pm.  No appointment needed.  Testing/Procedures: Your physician has requested that you have an echocardiogram. Echocardiography is a painless test that uses sound waves to create images of your heart. It provides your doctor with information about the size and shape of your heart and how well your heart's chambers and valves are working. This procedure takes approximately one hour. There are no restrictions for this procedure. This will be done at our [facility] location:  [address]  CT coronary calcium score. This test is done at [address]. This is $99 out of pocket.   Coronary CalciumScan A coronary calcium scan is an imaging test used to look for deposits of calcium and other fatty materials (plaques) in the inner lining of the blood vessels of the heart (coronary arteries). These deposits of calcium and plaques can partly clog and narrow the coronary arteries without producing any symptoms or warning signs. This puts a person at risk for a heart attack. This test can detect these deposits before symptoms develop. Tell a health care provider about:  Any allergies you have.  All medicines you are taking, including vitamins, herbs, eye drops, creams, and over-the-counter medicines.  Any problems you or family members have had with anesthetic medicines.  Any blood disorders you have.  Any surgeries you have had.  Any medical conditions you have.  Whether you are pregnant or may be pregnant. What are the risks? Generally, this is a safe procedure. However, problems may occur, including:  Harm to a  pregnant woman and her unborn baby. This test involves the use of radiation. Radiation exposure can be dangerous to a pregnant woman and her unborn baby. If you are pregnant, you generally should not have this procedure done.  Slight increase in the risk of cancer. This is because of the radiation involved in the test. What happens before the procedure? No preparation is needed for this procedure. What happens during the procedure?  You will undress and remove any jewelry around your neck or chest.  You will put on a hospital gown.  Sticky electrodes will be placed on your chest. The electrodes will be connected to an electrocardiogram (ECG) machine to record a tracing of the electrical activity of your heart.  A CT scanner will take pictures of your heart. During this time, you will be asked to lie still and hold your breath for 2-3 seconds while a picture of your heart is being taken. The procedure may vary among health care providers and hospitals. What happens after the procedure?  You can get dressed.  You can return to your normal activities.  It is up to you to get the results of your test. Ask your health care provider, or the department that is doing the test, when your results will be ready. Summary  A coronary calcium scan is an imaging test used to look for deposits of calcium and other fatty materials (plaques) in the inner lining of the blood vessels of the heart (coronary arteries).  Generally, this is a safe procedure. Tell your health care provider if you are pregnant  or may be pregnant.  No preparation is needed for this procedure.  A CT scanner will take pictures of your heart.  You can return to your normal activities after the scan is done. This information is not intended to replace advice given to you by your health care provider. Make sure you discuss any questions you have with your health care provider. Document Released: 12/26/2007 Document Revised:  05/18/2016 Document Reviewed: 05/18/2016 Elsevier Interactive Patient Education  2017 [place] physician has recommended that you have a sleep study. This test records several body functions during sleep, including: brain activity, eye movement, oxygen and carbon dioxide blood levels, heart rate and rhythm, breathing rate and rhythm, the flow of air through your mouth and nose, snoring, body muscle movements, and chest and belly movement. --this must be approved by insurance prior to scheduling.   Follow-Up: At [facility], you and your health needs are our priority.  As part of our continuing mission to provide you with exceptional heart care, we have created designated Provider Care Teams.  These Care Teams include your primary Cardiologist (physician) and Advanced Practice Providers (APPs -  Physician Assistants and Nurse Practitioners) who all work together to provide you with the care you need, when you need it.  We recommend signing up for the patient portal called "MyChart".  Sign up information is provided on this After Visit Summary.  MyChart is used to connect with patients for Virtual Visits (Telemedicine).  Patients are able to view lab/test results, encounter notes, upcoming appointments, etc.  Non-urgent messages can be sent to your provider as well.   To learn more about what you can do with MyChart, go to [email or web address].    Your next appointment:   3 month(s)  The format for your next appointment:   In Person  Provider:   Oswaldo Milian, MD   Other Instructions Please check your blood pressure at home daily, write it down.  Call the office or send message via Mychart with the readings in 2 weeks for Dr. Gardiner Rhyme to review.

## 2020-09-23 NOTE — Progress Notes (Signed)
Cardiology Office Note:    Date:  09/23/2020   ID:  Kendra Haney, DOB [birth year], MRN [id number]  PCP:  Minette Brine, FNP  Cardiologist:  No primary care provider on file.  Electrophysiologist:  None   Referring MD: Minette Brine, FNP   Chief Complaint  Patient presents with  . Hypertension    History of Present Illness:    Kendra Haney is a 60 y.o. female with a hx of asthma,  DVT, GERD, hypertension no longer on medications who is referred by Minette Brine, NP for evaluation of aortic atherosclerosis.  Chest x-ray was done on 07/31/2020 and noted borderline cardiomegaly with aortic atherosclerotic calcifications.  She was referred to cardiology for further evaluation.  Had [place] in December and had continued coughing so chest x-ray was done with findings as above. Denies any chest pain, dyspnea, lightheadedness, syncope, lower extremity edema, or palpitations.  Reports was previously on antihypertensive from 2009 until 2013 but lost weight and was able to come off medication.  Reports BP has been in 140s recently.  Never smoked.  Mother has CHF (EF 30%) and Afib.      Past Medical History:  Diagnosis Date  . Anxiety   . Arthritis   . Asthma    greater than 20 years ago   . Cancer ([place])    hx of skin cancer on back   . Complication of anesthesia    Nausea  & vomiting during eye surgery  . Depression   . DVT (deep venous thrombosis) ([place])    12/2011   . Family history of adverse reaction to anesthesia    sister has problems with nausea and vomiting   . GERD (gastroesophageal reflux disease)   . H/O varicella   . Hydradenitis 04/14/11  . Hypertension    hx of hypertension no longer on meds   . Menopausal symptoms 10/14/10  . Menses, irregular 07/02/10  . PONV (postoperative nausea and vomiting)   . Vulvar itching 04/14/11  . Yeast infection     Past Surgical History:  Procedure Laterality Date  . 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 TONSILLECTOMY      60 years old  . TOTAL KNEE ARTHROPLASTY Bilateral 10/24/2014   Procedure: TOTAL KNEE BILATERAL;  Surgeon: Gaynelle Arabian, MD;  Location: [facility];  Service: Orthopedics;  Laterality: Bilateral;  . WISDOM TOOTH EXTRACTION      Current Medications: Current Meds  Medication Sig  . acetaminophen (TYLENOL) 500 MG tablet Take 650 mg by mouth every 6 (six) hours as needed for mild pain.   . benzonatate (TESSALON PERLES) 100 MG capsule Take 1 capsule (100 mg total) by mouth every 6 (six) hours as needed for cough.  . budesonide-formoterol (SYMBICORT) 80-4.5 MCG/ACT inhaler Inhale 2 puffs into the lungs 2 (two) times daily.  Marland Kitchen buPROPion (WELLBUTRIN XL) 150 MG 24 hr tablet Take 450 mg by mouth every morning. Pt takes 3 tabs daily.  . fexofenadine (ALLEGRA) 180 MG tablet Take 180 mg by mouth daily.  Marland Kitchen losartan (COZAAR) 50 MG tablet Take 1 tablet (50 mg total) by mouth daily.  Marland Kitchen omeprazole (PRILOSEC) 20 MG capsule Take 20 mg by mouth daily.  . ondansetron (ZOFRAN) 4 MG tablet Take 1 tablet (4 mg total) by mouth daily as needed for nausea or vomiting.  . Probiotic Product (PROBIOTIC DAILY PO) Take 1 capsule by mouth daily at 12 noon.  . traZODone (DESYREL) 100 MG tablet Take 100  mg by mouth at bedtime as needed for sleep.   Marland Kitchen vortioxetine HBr (TRINTELLIX) 20 MG TABS tablet Take 20 mg by mouth daily.  . [DISCONTINUED] albuterol (VENTOLIN HFA) 108 (90 Base) MCG/ACT inhaler Inhale 2 puffs into the lungs every 6 (six) hours as needed for wheezing or shortness of breath.  . [DISCONTINUED] azithromycin (ZITHROMAX) 250 MG tablet Take 2 tablets (500 mg) on day 1, then take 1 tablet (250 mg) on days 2-5  . [DISCONTINUED] predniSONE (STERAPRED UNI-PAK 21 TAB) 5 MG (21) TBPK tablet Take as directed   Current Facility-Administered Medications for the 09/23/20 encounter (Office Visit) with Donato Heinz, MD  Medication  . albuterol (PROVENTIL HFA;VENTOLIN HFA) 108 (90 Base) MCG/ACT inhaler 2 puff  .  triamcinolone acetonide (KENALOG-40) injection 60 mg     Allergies:   Doxycycline, Dust mite extract, Mold extract [trichophyton mentagrophyte], Nsaids, and Tetracyclines & related   Social History   Socioeconomic History  . Marital status: Single    Spouse name: Not on file  . Number of children: Not on file  . Years of education: Not on file  . Highest education level: Not on file  Occupational History  . Not on file  Tobacco Use  . Smoking status: Never Smoker  . Smokeless tobacco: Never Used  Substance and Sexual Activity  . Alcohol use: No  . Drug use: No  . Sexual activity: Never    Birth control/protection: Abstinence, None  Other Topics Concern  . Not on file  Social History Narrative  . Not on file   Social Determinants of Health   Financial Resource Strain: Not on file  Food Insecurity: Not on file  Transportation Needs: Not on file  Physical Activity: Not on file  Stress: Not on file  Social Connections: Not on file    Family History: The patient's family history includes Cancer in her father; Hypertension in her mother and paternal grandmother. There is no history of Breast cancer.  ROS:   Please see the history of present illness.     All other systems reviewed and are negative.  EKGs/Labs/Other Studies Reviewed:    The following studies were reviewed today:   EKG:  EKG is  ordered today.  The ekg ordered today demonstrates sinus rhythm with PACs, rate 71, no ST abnormalities  Recent Labs: No results found for requested labs within last 8760 hours.  Recent Lipid Panel    Component Value Date/Time   CHOL 173 06/21/2019 1508   TRIG 73 06/21/2019 1508   HDL 73 06/21/2019 1508   CHOLHDL 2.4 06/21/2019 1508   LDLCALC 86 06/21/2019 1508    Physical Exam:    VS:  BP (!) 157/85   Pulse 71   Ht 5\' 10"  (1.778 m)   Wt (!) 400 lb 9.6 oz (181.7 kg)   BMI 57.48 kg/m     Wt Readings from Last 3 Encounters:  09/23/20 (!) 400 lb 9.6 oz (181.7 kg)   07/16/20 (!) 400 lb (181.4 kg)  07/31/19 (!) 424 lb 13.2 oz (192.7 kg)     GEN:in no acute distress HEENT: Normal NECK: No JVD; No carotid bruits LYMPHATICS: No lymphadenopathy CARDIAC: RRR, no murmurs, rubs, gallops RESPIRATORY:  Clear to auscultation without rales, wheezing or rhonchi  ABDOMEN: Soft, non-tender, non-distended MUSCULOSKELETAL:  No edema; No deformity  SKIN: Warm and dry NEUROLOGIC:  Alert and oriented x 3 PSYCHIATRIC:  Normal affect   ASSESSMENT:    1. Cardiomegaly   2. Hyperlipidemia,  unspecified hyperlipidemia type   3. Essential hypertension   4. Snoring   5. Morbid obesity ([place])    PLAN:    Cardiomegaly: Noted on chest x-ray.  Will evaluate further with echocardiogram.  Aortic atherosclerosis: Noted on chest x-ray.  LDL 86 on 06/21/2019.  Will check lipid panel.  Check calcium score to guide how aggressive to be in lowering cholesterol.  Hypertension: Not currently on any medications.  BP appears elevated.  Will start losartan 50 mg daily.  No recent labs, will check CMP, TSH, CBC, lipid panel.  Repeat BMP 2 weeks after starting losartan  Snoring:  Recommend sleep study to evaluate for OSA  Morbid obesity: Body mass index is 57.48 kg/m.  Discussed referral to healthy weight and wellness, but she would like to hold off at this time  RTC in 3 months   Medication Adjustments/Labs and Tests Ordered: Current medicines are reviewed at length with the patient today.  Concerns regarding medicines are outlined above.  Orders Placed This Encounter  Procedures  . CT CARDIAC SCORING (SELF PAY ONLY)  . Comprehensive metabolic panel  . CBC  . Lipid panel  . TSH  . EKG 12-Lead  . ECHOCARDIOGRAM COMPLETE  . Split night study   Meds ordered this encounter  Medications  . losartan (COZAAR) 50 MG tablet    Sig: Take 1 tablet (50 mg total) by mouth daily.    Dispense:  90 tablet    Refill:  3    Patient Instructions  Medication Instructions:  START  Losartan 50 mg once daily  *If you need a refill on your cardiac medications before your next appointment, please call your pharmacy*   Lab Work: CMET, CBC, TSH, Lipid today   AND  Please return for labs in 2 weeks (BMET)  Our in office lab hours are Monday-Friday 8:00-4:00, closed for lunch 12:45-1:45 pm.  No appointment needed.  Testing/Procedures: Your physician has requested that you have an echocardiogram. Echocardiography is a painless test that uses sound waves to create images of your heart. It provides your doctor with information about the size and shape of your heart and how well your heart's chambers and valves are working. This procedure takes approximately one hour. There are no restrictions for this procedure. This will be done at our [facility] location:  [address]  CT coronary calcium score. This test is done at [address]. This is $99 out of pocket.   Coronary CalciumScan A coronary calcium scan is an imaging test used to look for deposits of calcium and other fatty materials (plaques) in the inner lining of the blood vessels of the heart (coronary arteries). These deposits of calcium and plaques can partly clog and narrow the coronary arteries without producing any symptoms or warning signs. This puts a person at risk for a heart attack. This test can detect these deposits before symptoms develop. Tell a health care provider about:  Any allergies you have.  All medicines you are taking, including vitamins, herbs, eye drops, creams, and over-the-counter medicines.  Any problems you or family members have had with anesthetic medicines.  Any blood disorders you have.  Any surgeries you have had.  Any medical conditions you have.  Whether you are pregnant or may be pregnant. What are the risks? Generally, this is a safe procedure. However, problems may occur, including:  Harm to a pregnant woman and her unborn baby.  This test involves the use  of radiation. Radiation exposure can be dangerous to a pregnant woman and her unborn baby. If you are pregnant, you generally should not have this procedure done.  Slight increase in the risk of cancer. This is because of the radiation involved in the test. What happens before the procedure? No preparation is needed for this procedure. What happens during the procedure?  You will undress and remove any jewelry around your neck or chest.  You will put on a hospital gown.  Sticky electrodes will be placed on your chest. The electrodes will be connected to an electrocardiogram (ECG) machine to record a tracing of the electrical activity of your heart.  A CT scanner will take pictures of your heart. During this time, you will be asked to lie still and hold your breath for 2-3 seconds while a picture of your heart is being taken. The procedure may vary among health care providers and hospitals. What happens after the procedure?  You can get dressed.  You can return to your normal activities.  It is up to you to get the results of your test. Ask your health care provider, or the department that is doing the test, when your results will be ready. Summary  A coronary calcium scan is an imaging test used to look for deposits of calcium and other fatty materials (plaques) in the inner lining of the blood vessels of the heart (coronary arteries).  Generally, this is a safe procedure. Tell your health care provider if you are pregnant or may be pregnant.  No preparation is needed for this procedure.  A CT scanner will take pictures of your heart.  You can return to your normal activities after the scan is done. This information is not intended to replace advice given to you by your health care provider. Make sure you discuss any questions you have with your health care provider. Document Released: 12/26/2007 Document Revised: 05/18/2016 Document Reviewed:  05/18/2016 Elsevier Interactive Patient Education  2017 [place] physician has recommended that you have a sleep study. This test records several body functions during sleep, including: brain activity, eye movement, oxygen and carbon dioxide blood levels, heart rate and rhythm, breathing rate and rhythm, the flow of air through your mouth and nose, snoring, body muscle movements, and chest and belly movement. --this must be approved by insurance prior to scheduling.   Follow-Up: At [facility], you and your health needs are our priority.  As part of our continuing mission to provide you with exceptional heart care, we have created designated Provider Care Teams.  These Care Teams include your primary Cardiologist (physician) and Advanced Practice Providers (APPs -  Physician Assistants and Nurse Practitioners) who all work together to provide you with the care you need, when you need it.  We recommend signing up for the patient portal called "MyChart".  Sign up information is provided on this After Visit Summary.  MyChart is used to connect with patients for Virtual Visits (Telemedicine).  Patients are able to view lab/test results, encounter notes, upcoming appointments, etc.  Non-urgent messages can be sent to your provider as well.   To learn more about what you can do with MyChart, go to [email or web address].    Your next appointment:   3 month(s)  The format for your next appointment:   In Person  Provider:   Oswaldo Milian, MD   Other Instructions Please check your blood pressure at home daily, write it down.  Call the office or  send message via Mychart with the readings in 2 weeks for Dr. Gardiner Rhyme to review.       Signed, Donato Heinz, MD  09/23/2020 3:39 PM    [place]

## 2020-09-24 ENCOUNTER — Telehealth: Payer: Self-pay | Admitting: Cardiology

## 2020-09-24 LAB — CBC
Hematocrit: 42 % (ref 34.0–46.6)
Hemoglobin: 13.2 g/dL (ref 11.1–15.9)
MCH: 26.5 pg — ABNORMAL LOW (ref 26.6–33.0)
MCHC: 31.4 g/dL — ABNORMAL LOW (ref 31.5–35.7)
MCV: 84 fL (ref 79–97)
Platelets: 320 10*3/uL (ref 150–450)
RBC: 4.98 x10E6/uL (ref 3.77–5.28)
RDW: 13.2 % (ref 11.7–15.4)
WBC: 6.5 10*3/uL (ref 3.4–10.8)

## 2020-09-24 LAB — LIPID PANEL
Chol/HDL Ratio: 2.4 ratio (ref 0.0–4.4)
Cholesterol, Total: 192 mg/dL (ref 100–199)
HDL: 79 mg/dL (ref >39)
LDL Chol Calc (NIH): 100 mg/dL — ABNORMAL HIGH (ref 0–99)
Triglycerides: 72 mg/dL (ref 0–149)
VLDL Cholesterol Cal: 13 mg/dL (ref 5–40)

## 2020-09-24 LAB — COMPREHENSIVE METABOLIC PANEL
ALT: 37 IU/L — ABNORMAL HIGH (ref 0–32)
AST: 34 IU/L (ref 0–40)
Albumin/Globulin Ratio: 1.7 (ref 1.2–2.2)
Albumin: 4.4 g/dL (ref 3.8–4.9)
Alkaline Phosphatase: 87 IU/L (ref 44–121)
BUN/Creatinine Ratio: 17 (ref 9–23)
BUN: 19 mg/dL (ref 6–24)
Bilirubin Total: 0.5 mg/dL (ref 0.0–1.2)
CO2: 21 mmol/L (ref 20–29)
Calcium: 9.9 mg/dL (ref 8.7–10.2)
Chloride: 103 mmol/L (ref 96–106)
Creatinine, Ser: 1.15 mg/dL — ABNORMAL HIGH (ref 0.57–1.00)
Globulin, Total: 2.6 g/dL (ref 1.5–4.5)
Glucose: 95 mg/dL (ref 65–99)
Potassium: 5.1 mmol/L (ref 3.5–5.2)
Sodium: 141 mmol/L (ref 134–144)
Total Protein: 7 g/dL (ref 6.0–8.5)
eGFR: 56 mL/min/{1.73_m2} — ABNORMAL LOW (ref >59)

## 2020-09-24 LAB — TSH: TSH: 1.91 u[IU]/mL (ref 0.450–4.500)

## 2020-09-24 MED ORDER — AMLODIPINE BESYLATE 5 MG PO TABS: 5.0000 mg | ORAL_TABLET | Freq: Every day | ORAL | 3 refills | Status: DC

## 2020-09-24 NOTE — Telephone Encounter (Signed)
Pt called in returning [facility] called about her lab results   Best number for patient -  [phone]

## 2020-09-24 NOTE — Telephone Encounter (Signed)
Pt advised and verbalized understanding of her lab results and will stop the losartan and start Amlodipine..she has Echo 10/18/20.   She will call if she has any problems or questions.

## 2020-09-25 ENCOUNTER — Other Ambulatory Visit: Payer: Self-pay | Admitting: *Deleted

## 2020-09-27 ENCOUNTER — Telehealth: Payer: Self-pay | Admitting: *Deleted

## 2020-09-27 NOTE — Telephone Encounter (Signed)
Patient notified of sleep study appointment scheduled on 11/28/20.

## 2020-10-07 ENCOUNTER — Other Ambulatory Visit: Payer: Self-pay | Admitting: Nurse Practitioner

## 2020-10-08 ENCOUNTER — Other Ambulatory Visit: Payer: Self-pay | Admitting: Cardiology

## 2020-10-09 LAB — BASIC METABOLIC PANEL
BUN/Creatinine Ratio: 23 (ref 9–23)
BUN: 18 mg/dL (ref 6–24)
CO2: 23 mmol/L (ref 20–29)
Calcium: 9 mg/dL (ref 8.7–10.2)
Chloride: 102 mmol/L (ref 96–106)
Creatinine, Ser: 0.8 mg/dL (ref 0.57–1.00)
Glucose: 84 mg/dL (ref 65–99)
Potassium: 4.6 mmol/L (ref 3.5–5.2)
Sodium: 139 mmol/L (ref 134–144)
eGFR: 86 mL/min/{1.73_m2} (ref >59)

## 2020-10-17 ENCOUNTER — Other Ambulatory Visit: Payer: Self-pay | Admitting: Nurse Practitioner

## 2020-10-18 ENCOUNTER — Other Ambulatory Visit: Payer: Self-pay

## 2020-10-18 ENCOUNTER — Ambulatory Visit (INDEPENDENT_AMBULATORY_CARE_PROVIDER_SITE_OTHER)
Admission: RE | Admit: 2020-10-18 | Discharge: 2020-10-18 | Disposition: A | Discharge status: Home / Self Care | Payer: Self-pay | Source: Ambulatory Visit | Attending: Cardiology | Admitting: Cardiology

## 2020-10-18 ENCOUNTER — Ambulatory Visit (HOSPITAL_COMMUNITY): Payer: 59 | Attending: Cardiology

## 2020-10-18 DIAGNOSIS — I517 Cardiomegaly: Secondary | ICD-10-CM | POA: Diagnosis present

## 2020-10-18 DIAGNOSIS — I10 Essential (primary) hypertension: Secondary | ICD-10-CM | POA: Diagnosis present

## 2020-10-18 LAB — ECHOCARDIOGRAM COMPLETE
Area-P 1/2: 3.02 cm2
S' Lateral: 3.1 cm

## 2020-11-28 ENCOUNTER — Encounter (HOSPITAL_BASED_OUTPATIENT_CLINIC_OR_DEPARTMENT_OTHER): Payer: 59 | Admitting: Cardiovascular Disease

## 2020-12-24 ENCOUNTER — Ambulatory Visit: Payer: 59 | Admitting: Cardiology

## 2021-01-17 ENCOUNTER — Encounter: Payer: Self-pay | Admitting: Nurse Practitioner

## 2021-01-23 ENCOUNTER — Ambulatory Visit: Payer: 59 | Admitting: Nurse Practitioner

## 2021-01-27 ENCOUNTER — Other Ambulatory Visit: Payer: Self-pay

## 2021-01-27 ENCOUNTER — Encounter: Payer: Self-pay | Admitting: Nurse Practitioner

## 2021-01-27 ENCOUNTER — Ambulatory Visit: Payer: 59 | Admitting: Nurse Practitioner

## 2021-01-27 VITALS — BP 130/84 | HR 70 | Temp 98.6°F | Ht 70.0 in | Wt >= 6400 oz

## 2021-01-27 DIAGNOSIS — R5383 Other fatigue: Secondary | ICD-10-CM | POA: Diagnosis not present

## 2021-01-27 DIAGNOSIS — Z6841 Body Mass Index (BMI) 40.0 and over, adult: Secondary | ICD-10-CM

## 2021-01-27 DIAGNOSIS — E538 Deficiency of other specified B group vitamins: Secondary | ICD-10-CM

## 2021-01-27 NOTE — Progress Notes (Signed)
I,Vernadine Coombs,acting as a Education administrator for Minette Brine, FNP.,have documented all relevant documentation on the behalf of Minette Brine, FNP,as directed by  Minette Brine, FNP while in the presence of Minette Brine, [place].  This visit occurred during the SARS-CoV-2 public health emergency.  Safety protocols were in place, including screening questions prior to the visit, additional usage of staff PPE, and extensive cleaning of exam room while observing appropriate contact time as indicated for disinfecting solutions.  Subjective:     Patient ID: Kendra Haney , female    DOB: [birth year] , 60 y.o.   MRN: [id number]   Chief Complaint  Patient presents with   Memory Loss     HPI  Pt presents today with memory loss, and  complaining of being fatigue. She would like to get her blood drawn to check her B12. Pt also states her mother is currently ill, so she is dealing with a lot of stress right now. Which is why she feels as if her memory is going quicker than usually. Pt states no specific time period for memory loss, but it does happen every other year. Pt is also followed by GYN, she has not seen her in a while.  When talking in  a conversation if she is interrupted she will have difficulty with remembering what she wanted.  Her mother is in an apt with care givers. She has started therapy and agreed to walk and join the [facility]. She has also had a sleep study done by her cardiologist and will discuss the results next month.   Wt Readings from Last 3 Encounters: 01/27/21 : (!) 425 lb (192.8 kg) 09/23/20 : (!) 400 lb 9.6 oz (181.7 kg) 07/16/20 : (!) 400 lb (181.4 kg)      Past Medical History:  Diagnosis Date   Anxiety    Arthritis    Asthma    greater than 20 years ago    Cancer ([place])    hx of skin cancer on back    Complication of anesthesia    Nausea  & vomiting during eye surgery   Depression    DVT (deep venous thrombosis) ([place])    12/2011    Family history of adverse reaction to anesthesia     sister has problems with nausea and vomiting    GERD (gastroesophageal reflux disease)    H/O varicella    Hydradenitis 04/14/11   Hypertension    hx of hypertension no longer on meds    Menopausal symptoms 10/14/10   Menses, irregular 07/02/10   PONV (postoperative nausea and vomiting)    Vulvar itching 04/14/11   Yeast infection      Family History  Problem Relation Age of Onset   Hypertension Paternal Grandmother    Cancer Father    Hypertension Mother    Breast cancer Neg Hx      Current Outpatient Medications:    acetaminophen (TYLENOL) 500 MG tablet, Take 650 mg by mouth every 6 (six) hours as needed for mild pain. , Disp: , Rfl:    ALPRAZolam (XANAX) 1 MG tablet, alprazolam 1 mg tablet, Disp: , Rfl:    amLODipine (NORVASC) 5 MG tablet, Take 1 tablet (5 mg total) by mouth daily., Disp: 90 tablet, Rfl: 3   benzonatate (TESSALON) 100 MG capsule, TAKE 1 CAPSULE (100 MG TOTAL) BY MOUTH EVERY 6 (SIX) HOURS AS NEEDED FOR COUGH, Disp: 30 capsule, Rfl: 1   buPROPion (WELLBUTRIN XL) 150 MG 24 hr tablet, Take 450 mg by  mouth every morning. Pt takes 3 tabs daily., Disp: , Rfl:    fexofenadine (ALLEGRA) 180 MG tablet, Take 180 mg by mouth daily., Disp: , Rfl:    omeprazole (PRILOSEC) 20 MG capsule, Take 20 mg by mouth daily., Disp: , Rfl:    ondansetron (ZOFRAN) 4 MG tablet, Take 1 tablet (4 mg total) by mouth daily as needed for nausea or vomiting., Disp: 30 tablet, Rfl: 1   Probiotic Product (PROBIOTIC DAILY PO), Take 1 capsule by mouth daily at 12 noon., Disp: , Rfl:    traZODone (DESYREL) 100 MG tablet, Take 100 mg by mouth at bedtime as needed for sleep. , Disp: , Rfl:    vortioxetine HBr (TRINTELLIX) 20 MG TABS tablet, Take 20 mg by mouth daily., Disp: , Rfl:    VRAYLAR capsule, Take 1.5 mg by mouth daily., Disp: , Rfl:    budesonide-formoterol (SYMBICORT) 80-4.5 MCG/ACT inhaler, Inhale 2 puffs into the lungs 2 (two) times daily. (Patient not taking: Reported on 01/27/2021), Disp: 1  each, Rfl: 3  Current Facility-Administered Medications:    albuterol (PROVENTIL HFA;VENTOLIN HFA) 108 (90 Base) MCG/ACT inhaler 2 puff, 2 puff, Inhalation, Once, Minette Brine, FNP   triamcinolone acetonide (KENALOG-40) injection 60 mg, 60 mg, Intramuscular, Once, Minette Brine, FNP   Allergies  Allergen Reactions   Doxycycline Nausea And Vomiting   Dust Mite Extract     UNKNOWN   Mold Extract [Trichophyton Mentagrophyte]     UNKNOWN   Nsaids Other (See Comments)    GI UPSET    Tetracyclines & Related Nausea And Vomiting     Review of Systems  Constitutional: Negative.   Respiratory: Negative.    Cardiovascular: Negative.   Neurological: Negative.   Psychiatric/Behavioral: Negative.      Today's Vitals   01/27/21 1640  BP: 130/84  Pulse: 70  Temp: 98.6 F (37 C)  Weight: (!) 425 lb (192.8 kg)  Height: 5\' 10"  (1.778 m)   Body mass index is 60.98 kg/m.  Wt Readings from Last 3 Encounters:  01/27/21 (!) 425 lb (192.8 kg)  09/23/20 (!) 400 lb 9.6 oz (181.7 kg)  07/16/20 (!) 400 lb (181.4 kg)    Objective:  Physical Exam Vitals reviewed.  Constitutional:      General: She is not in acute distress.    Appearance: Normal appearance.  Cardiovascular:     Rate and Rhythm: Normal rate and regular rhythm.     Pulses: Normal pulses.     Heart sounds: Normal heart sounds. No murmur heard. Pulmonary:     Effort: Pulmonary effort is normal. No respiratory distress.     Breath sounds: Normal breath sounds. No wheezing.  Neurological:     General: No focal deficit present.     Mental Status: She is alert and oriented to person, place, and time.     Cranial Nerves: No cranial nerve deficit.     Motor: No weakness.  Psychiatric:        Mood and Affect: Mood normal.        Behavior: Behavior normal.        Thought Content: Thought content normal.        Judgment: Judgment normal.        Assessment And Plan:     1. Vitamin B12 deficiency Will check her vitamin B12,  she was given a vitamin B12 injection today pending her results.  - Vitamin B12 - CBC  2. Fatigue, unspecified type Will check metabolic causes She has  had a sleep study done by her cardiologist and is awaiting her results - Iron, TIBC and Ferritin Panel - TSH  3. Class 3 severe obesity due to excess calories without serious comorbidity with body mass index (BMI) of 60.0 to 69.9 in adult [facility])  Chronic Discussed healthy diet and regular exercise options  Encouraged to exercise at least 150 minutes per week with 2 days of strength training She has gained approximately 25 lbs since her last visit  Patient was given opportunity to ask questions. Patient verbalized understanding of the plan and was able to repeat key elements of the plan. All questions were answered to their satisfaction.  Minette Brine, FNP   I, Minette Brine, FNP, have reviewed all documentation for this visit. The documentation on 01/27/21 for the exam, diagnosis, procedures, and orders are all accurate and complete.   IF YOU HAVE BEEN REFERRED TO A SPECIALIST, IT MAY TAKE 1-2 WEEKS TO SCHEDULE/PROCESS THE REFERRAL. IF YOU HAVE NOT HEARD FROM US/SPECIALIST IN TWO WEEKS, PLEASE GIVE [place] A CALL AT [phone].   THE PATIENT IS ENCOURAGED TO PRACTICE SOCIAL DISTANCING DUE TO THE COVID-19 PANDEMIC.

## 2021-01-27 NOTE — Patient Instructions (Signed)

## 2021-01-28 LAB — CBC
Hematocrit: 39.8 % (ref 34.0–46.6)
Hemoglobin: 12.9 g/dL (ref 11.1–15.9)
MCH: 27.7 pg (ref 26.6–33.0)
MCHC: 32.4 g/dL (ref 31.5–35.7)
MCV: 85 fL (ref 79–97)
Platelets: 273 10*3/uL (ref 150–450)
RBC: 4.66 x10E6/uL (ref 3.77–5.28)
RDW: 13.2 % (ref 11.7–15.4)
WBC: 6.3 10*3/uL (ref 3.4–10.8)

## 2021-01-28 LAB — TSH: TSH: 2.33 u[IU]/mL (ref 0.450–4.500)

## 2021-01-28 LAB — VITAMIN B12: Vitamin B-12: 213 pg/mL — ABNORMAL LOW (ref 232–1245)

## 2021-01-28 LAB — IRON,TIBC AND FERRITIN PANEL
Ferritin: 36 ng/mL (ref 15–150)
Iron Saturation: 17 % (ref 15–55)
Iron: 64 ug/dL (ref 27–159)
Total Iron Binding Capacity: 387 ug/dL (ref 250–450)
UIBC: 323 ug/dL (ref 131–425)

## 2021-02-04 ENCOUNTER — Other Ambulatory Visit: Payer: Self-pay

## 2021-02-04 ENCOUNTER — Ambulatory Visit (INDEPENDENT_AMBULATORY_CARE_PROVIDER_SITE_OTHER): Payer: 59

## 2021-02-04 VITALS — BP 132/78 | HR 66 | Temp 97.7°F | Ht 70.0 in | Wt >= 6400 oz

## 2021-02-04 DIAGNOSIS — E538 Deficiency of other specified B group vitamins: Secondary | ICD-10-CM | POA: Diagnosis not present

## 2021-02-04 MED ORDER — CYANOCOBALAMIN 1000 MCG/ML IJ SOLN
1000.0000 ug | Freq: Once | INTRAMUSCULAR | Status: AC
  Administered 2021-02-04: 1000 ug via INTRAMUSCULAR

## 2021-02-04 NOTE — Progress Notes (Signed)
Pt here for weekly B12 injection per   B12 1060mg given IM, and pt tolerated injection well.  Next B12 injection scheduled for 02/11/2021.

## 2021-02-11 ENCOUNTER — Other Ambulatory Visit: Payer: Self-pay

## 2021-02-11 ENCOUNTER — Ambulatory Visit (INDEPENDENT_AMBULATORY_CARE_PROVIDER_SITE_OTHER): Payer: 59

## 2021-02-11 VITALS — BP 124/80 | HR 86 | Ht 66.4 in | Wt >= 6400 oz

## 2021-02-11 DIAGNOSIS — E538 Deficiency of other specified B group vitamins: Secondary | ICD-10-CM | POA: Diagnosis not present

## 2021-02-11 MED ORDER — CYANOCOBALAMIN 1000 MCG/ML IJ SOLN
1000.0000 ug | Freq: Once | INTRAMUSCULAR | Status: AC
  Administered 2021-02-11: 1000 ug via INTRAMUSCULAR

## 2021-02-11 NOTE — Progress Notes (Signed)
Patient presents today for her 2nd vitamin B12 injection. YL,RMA

## 2021-02-18 ENCOUNTER — Ambulatory Visit (INDEPENDENT_AMBULATORY_CARE_PROVIDER_SITE_OTHER): Payer: 59

## 2021-02-18 ENCOUNTER — Other Ambulatory Visit: Payer: Self-pay

## 2021-02-18 VITALS — BP 134/88 | HR 71 | Temp 98.4°F | Ht 67.2 in | Wt >= 6400 oz

## 2021-02-18 DIAGNOSIS — E538 Deficiency of other specified B group vitamins: Secondary | ICD-10-CM

## 2021-02-18 MED ORDER — CYANOCOBALAMIN 1000 MCG/ML IJ SOLN
1000.0000 ug | Freq: Once | INTRAMUSCULAR | Status: AC
Start: 2021-02-18 — End: 2021-02-18
  Administered 2021-02-18: 1000 ug via INTRAMUSCULAR

## 2021-02-18 NOTE — Progress Notes (Signed)
Patient presents today for her 3rd vitamin B12. YL,RMA

## 2021-02-24 NOTE — Progress Notes (Signed)
Cardiology Office Note:    Date:  02/26/2021   ID:  Kendra Haney, DOB September 27, 1960, MRN [id number]  PCP:  Kendra Brine, FNP  Cardiologist:  None  Electrophysiologist:  None   Referring MD: Kendra Brine, FNP   Chief Complaint  Patient presents with   Hypertension     History of Present Illness:    Kendra Haney is a 60 y.o. female with a hx of asthma,  DVT, GERD, hypertension no longer on medications who presents for follow-up.  She was referred by Kendra Brine, NP for evaluation of aortic atherosclerosis, initially seen on 09/23/20.  Chest x-ray was done on 07/31/2020 and noted borderline cardiomegaly with aortic atherosclerotic calcifications.  She was referred to cardiology for further evaluation.  Had COVID in December 2021 and had continued coughing so chest x-ray was done with findings as above. Denies any chest pain, dyspnea, lightheadedness, syncope, lower extremity edema, or palpitations.  Reports was previously on antihypertensive from 2009 until 2013 but lost weight and was able to come off medication.  Reports BP has been in 140s recently.  Never smoked.  Mother has CHF (EF 30%) and Afib.    Echocardiogram on 10/18/20 showed normal biventricular function, no significant valvular disease.  Calcium score 0 on 10/18/20.  Reports BP 120/130s/70-80s when checks at home.  Since last clinic visit, she reports that she has been doing well.  Denies any chest pain, dyspnea, lightheadedness, syncope, lower extremity edema, or palpitations.  Started at [facility] doing [employer].  Denies any exertional symptoms.   Wt Readings from Last 3 Encounters:  02/26/21 (!) 424 lb 3.2 oz (192.4 kg)  02/18/21 (!) 420 lb (190.5 kg)  02/11/21 (!) 419 lb (190.1 kg)      Past Medical History:  Diagnosis Date   Anxiety    Arthritis    Asthma    greater than 20 years ago    Cancer ([place])    hx of skin cancer on back    Complication of anesthesia    Nausea  & vomiting during eye surgery    Depression    DVT (deep venous thrombosis) ([place])    12/2011    Family history of adverse reaction to anesthesia    sister has problems with nausea and vomiting    GERD (gastroesophageal reflux disease)    H/O varicella    Hydradenitis 04/14/11   Hypertension    hx of hypertension no longer on meds    Menopausal symptoms 10/14/10   Menses, irregular 07/02/10   PONV (postoperative nausea and vomiting)    Vulvar itching 04/14/11   Yeast infection     Past Surgical History:  Procedure Laterality Date   EYE MUSCLE SURGERY     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    ALPRAZolam (XANAX) 1 MG tablet Take 1 mg by mouth daily as needed.   buPROPion (WELLBUTRIN XL) 150 MG 24 hr tablet Take 450 mg by mouth every morning. Patient takes 2 tablets daily.   fexofenadine (ALLEGRA) 180 MG tablet Take 180 mg by mouth  daily.   methylphenidate 36 MG PO CR tablet Take 36 mg by mouth daily.   omeprazole (PRILOSEC) 20 MG capsule Take 20 mg by mouth daily.   ondansetron (ZOFRAN) 4 MG tablet Take 1 tablet (4 mg total) by mouth daily as needed for nausea or vomiting.   Probiotic Product (PROBIOTIC DAILY PO) Take 1 capsule by mouth daily at 12 noon.   traZODone (DESYREL) 100 MG tablet Take 100 mg by mouth at bedtime as needed for sleep.    vortioxetine HBr (TRINTELLIX) 20 MG TABS tablet Take 20 mg by mouth daily.   [DISCONTINUED] amLODipine (NORVASC) 5 MG tablet Take 1 tablet (5 mg total) by mouth daily.   Current Facility-Administered Medications for the 02/26/21 encounter (Office Visit) with Donato Heinz, MD   Medication   albuterol (PROVENTIL HFA;VENTOLIN HFA) 108 (90 Base) MCG/ACT inhaler 2 puff   triamcinolone acetonide (KENALOG-40) injection 60 mg     Allergies:   Doxycycline, Dust mite extract, Mold extract [trichophyton mentagrophyte], Nsaids, and Tetracyclines & related   Social History   Socioeconomic History   Marital status: Single    Spouse name: Not on file   Number of children: Not on file   Years of education: Not on file   Highest education level: Not on file  Occupational History   Not on file  Tobacco Use   Smoking status: Never   Smokeless tobacco: Never  Substance and Sexual Activity   Alcohol use: No   Drug use: No   Sexual activity: Never    Birth control/protection: Abstinence, None  Other Topics Concern   Not on file  Social History Narrative   Not on file   Social Determinants of Health   Financial Resource Strain: Not on file  Food Insecurity: Not on file  Transportation Needs: Not on file  Physical Activity: Not on file  Stress: Not on file  Social Connections: Not on file    Family History: The patient's family history includes Cancer in her father; Hypertension in her mother and paternal grandmother. There is no history of Breast cancer.  ROS:   Please see the history of present illness.     All other systems reviewed and are negative.  EKGs/Labs/Other Studies Reviewed:    The following studies were reviewed today:   EKG:  EKG is  ordered today.  The ekg ordered today demonstrates sinus rhythm with PACs, rate 71, no ST abnormalities  Recent Labs: 09/23/2020: ALT 37 10/08/2020: BUN 18; Creatinine, Ser 0.80; Potassium 4.6; Sodium 139 01/27/2021: Hemoglobin 12.9; Platelets 273; TSH 2.330  Recent Lipid Panel    Component Value Date/Time   CHOL 192 09/23/2020 1546   TRIG 72 09/23/2020 1546   HDL 79 09/23/2020 1546   CHOLHDL 2.4 09/23/2020 1546   LDLCALC 100 (H) 09/23/2020 1546    Physical Exam:    VS:  BP (!) 161/88 (BP Location:  Right Arm, Patient Position: Sitting, Cuff Size: Normal) Comment (BP Location): Forearm  Pulse 77   Ht '5\' 10"'$  (1.778 m)   Wt (!) 424 lb 3.2 oz (192.4 kg)   SpO2 99%   BMI 60.87 kg/m     Wt Readings from Last 3 Encounters:  02/26/21 (!) 424 lb 3.2 oz (192.4 kg)  02/18/21 (!) 420 lb (190.5 kg)  02/11/21 (!) 419 lb (190.1 kg)     GEN:in no acute distress HEENT: Normal NECK: No JVD; No carotid bruits LYMPHATICS: No lymphadenopathy CARDIAC: RRR, no murmurs, rubs, gallops RESPIRATORY:  Clear  to auscultation without rales, wheezing or rhonchi  ABDOMEN: Soft, non-tender, non-distended MUSCULOSKELETAL:  No edema; No deformity  SKIN: Warm and dry NEUROLOGIC:  Alert and oriented x 3 PSYCHIATRIC:  Normal affect   ASSESSMENT:    1. Morbid obesity ([place])   2. Essential hypertension   3. Cardiomegaly   4. Snoring     PLAN:    Possible cardiomegaly: Noted on chest x-ray.  Echocardiogram on 10/18/20 showed normal biventricular size and function, no significant valvular disease.  Aortic atherosclerosis: Noted on chest x-ray.  LDL 86 on 06/21/2019.  Calcium score 0 on 10/18/20.  Hypertension: On amlodipine 5 mg daily.  BP elevated, will increase to 10 mg daily  Snoring:  Recommend sleep study to evaluate for OSA  Morbid obesity: Body mass index is 60.87 kg/m.  Will refer to to healthy weight and wellness,  RTC in 1 year   Medication Adjustments/Labs and Tests Ordered: Current medicines are reviewed at length with the patient today.  Concerns regarding medicines are outlined above.  Orders Placed This Encounter  Procedures   Ambulatory referral to [place] sleep test    Meds ordered this encounter  Medications   amLODipine (NORVASC) 10 MG tablet    Sig: Take 1 tablet (10 mg total) by mouth daily.    Dispense:  90 tablet    Refill:  3    Dose increase     Patient Instructions  Medication Instructions:  INCREASE amlodipine to 10 mg daily  *If you need a  refill on your cardiac medications before your next appointment, please call your pharmacy*  Testing/Procedures: Your physician has recommended that you have a sleep study. This test records several body functions during sleep, including: brain activity, eye movement, oxygen and carbon dioxide blood levels, heart rate and rhythm, breathing rate and rhythm, the flow of air through your mouth and nose, snoring, body muscle movements, and chest and belly movement.  Follow-Up: At [facility], you and your health needs are our priority.  As part of our continuing mission to provide you with exceptional heart care, we have created designated Provider Care Teams.  These Care Teams include your primary Cardiologist (physician) and Advanced Practice Providers (APPs -  Physician Assistants and Nurse Practitioners) who all work together to provide you with the care you need, when you need it.  We recommend signing up for the patient portal called "MyChart".  Sign up information is provided on this After Visit Summary.  MyChart is used to connect with patients for Virtual Visits (Telemedicine).  Patients are able to view lab/test results, encounter notes, upcoming appointments, etc.  Non-urgent messages can be sent to your provider as well.   To learn more about what you can do with MyChart, go to [email or web address].    Your next appointment:   12 month(s)  The format for your next appointment:   In Person  Provider:   Oswaldo Milian, MD   Other Instructions You have been referred to: Healthy Weight and Wellness    Signed, Donato Heinz, MD  02/26/2021 4:50 PM    [place]

## 2021-02-26 ENCOUNTER — Other Ambulatory Visit: Payer: Self-pay

## 2021-02-26 ENCOUNTER — Encounter: Payer: Self-pay | Admitting: Cardiology

## 2021-02-26 ENCOUNTER — Ambulatory Visit: Payer: 59 | Admitting: Cardiology

## 2021-02-26 DIAGNOSIS — I517 Cardiomegaly: Secondary | ICD-10-CM | POA: Diagnosis not present

## 2021-02-26 DIAGNOSIS — I10 Essential (primary) hypertension: Secondary | ICD-10-CM

## 2021-02-26 DIAGNOSIS — R0683 Snoring: Secondary | ICD-10-CM

## 2021-02-26 MED ORDER — AMLODIPINE BESYLATE 10 MG PO TABS: 10.0000 mg | ORAL_TABLET | Freq: Every day | ORAL | 3 refills | Status: DC

## 2021-02-26 NOTE — Patient Instructions (Signed)
Medication Instructions:  INCREASE amlodipine to 10 mg daily  *If you need a refill on your cardiac medications before your next appointment, please call your pharmacy*  Testing/Procedures: Your physician has recommended that you have a sleep study. This test records several body functions during sleep, including: brain activity, eye movement, oxygen and carbon dioxide blood levels, heart rate and rhythm, breathing rate and rhythm, the flow of air through your mouth and nose, snoring, body muscle movements, and chest and belly movement.  Follow-Up: At [facility], you and your health needs are our priority.  As part of our continuing mission to provide you with exceptional heart care, we have created designated Provider Care Teams.  These Care Teams include your primary Cardiologist (physician) and Advanced Practice Providers (APPs -  Physician Assistants and Nurse Practitioners) who all work together to provide you with the care you need, when you need it.  We recommend signing up for the patient portal called "MyChart".  Sign up information is provided on this After Visit Summary.  MyChart is used to connect with patients for Virtual Visits (Telemedicine).  Patients are able to view lab/test results, encounter notes, upcoming appointments, etc.  Non-urgent messages can be sent to your provider as well.   To learn more about what you can do with MyChart, go to [email or web address].    Your next appointment:   12 month(s)  The format for your next appointment:   In Person  Provider:   Oswaldo Milian, MD   Other Instructions You have been referred to: Healthy Weight and Wellness

## 2021-04-07 ENCOUNTER — Encounter (HOSPITAL_BASED_OUTPATIENT_CLINIC_OR_DEPARTMENT_OTHER): Payer: 59 | Admitting: Cardiovascular Disease

## 2021-04-29 ENCOUNTER — Ambulatory Visit: Payer: 59 | Admitting: Nurse Practitioner

## 2021-05-19 ENCOUNTER — Encounter: Payer: Self-pay | Admitting: Nurse Practitioner

## 2021-05-20 ENCOUNTER — Other Ambulatory Visit: Payer: Self-pay

## 2021-05-20 ENCOUNTER — Encounter: Payer: Self-pay | Admitting: Nurse Practitioner

## 2021-05-20 ENCOUNTER — Ambulatory Visit: Payer: 59 | Admitting: Nurse Practitioner

## 2021-05-20 VITALS — BP 122/86 | HR 80 | Temp 98.1°F | Ht 70.0 in | Wt >= 6400 oz

## 2021-05-20 DIAGNOSIS — E66813 Obesity, class 3: Secondary | ICD-10-CM

## 2021-05-20 DIAGNOSIS — R319 Hematuria, unspecified: Secondary | ICD-10-CM | POA: Diagnosis not present

## 2021-05-20 DIAGNOSIS — Z6841 Body Mass Index (BMI) 40.0 and over, adult: Secondary | ICD-10-CM

## 2021-05-20 DIAGNOSIS — R300 Dysuria: Secondary | ICD-10-CM | POA: Diagnosis not present

## 2021-05-20 DIAGNOSIS — Z23 Encounter for immunization: Secondary | ICD-10-CM

## 2021-05-20 DIAGNOSIS — J302 Other seasonal allergic rhinitis: Secondary | ICD-10-CM

## 2021-05-20 LAB — POCT URINALYSIS DIPSTICK
Bilirubin, UA: NEGATIVE
Glucose, UA: NEGATIVE
Ketones, UA: NEGATIVE
Nitrite, UA: NEGATIVE
Protein, UA: POSITIVE — AB
Spec Grav, UA: 1.015 (ref 1.010–1.025)
Urobilinogen, UA: 0.2 E.U./dL
pH, UA: 6 (ref 5.0–8.0)

## 2021-05-20 MED ORDER — NITROFURANTOIN MONOHYD MACRO 100 MG PO CAPS: 100.0000 mg | ORAL_CAPSULE | Freq: Two times a day (BID) | ORAL | 0 refills | Status: AC

## 2021-05-20 NOTE — Progress Notes (Signed)
I,Victoria T Hamilton,acting as a Education administrator for Minette Brine, FNP.,have documented all relevant documentation on the behalf of Minette Brine, FNP,as directed by  Minette Brine, FNP while in the presence of Minette Brine, [place].  This visit occurred during the SARS-CoV-2 public health emergency.  Safety protocols were in place, including screening questions prior to the visit, additional usage of staff PPE, and extensive cleaning of exam room while observing appropriate contact time as indicated for disinfecting solutions.  Subjective:     Patient ID: Kendra Haney , female    DOB: June 05, 1961 , 60 y.o.   MRN: [id number]   Chief Complaint  Patient presents with   Dysuria    HPI  Pt here for UTI. She reports she does have a burning sensation, discomfort, heaviness feeling in bladder along with smell.   She would like to get an kenalog shot, she is extremely sensitive to smells, experiencing runny nose & cough, headache as well. She reports a new symptoms of having runny eyes and felt like her throat was going to close. She takes zyrtec daily, no nasal spray. She has tried flonase in the past but has been a while. Also reports having some wheezing at times.  She has not seen an allergist in quite some time.   Wt Readings from Last 3 Encounters: 05/20/21 : (!) 406 lb 6.4 oz (184.3 kg) 02/26/21 : (!) 424 lb 3.2 oz (192.4 kg) 02/18/21 : (!) 420 lb (190.5 kg)    Urinary Frequency  This is a new problem. The current episode started yesterday. The problem occurs every urination. The problem has been gradually worsening. The quality of the pain is described as burning. The pain is moderate. There has been no fever. She is Not sexually active. There is No history of pyelonephritis. Associated symptoms include frequency, hesitancy and urgency. Pertinent negatives include no chills, discharge, flank pain, nausea, sweats or vomiting. She has tried increased fluids (Was going to get AZO) for the symptoms. There  is no history of kidney stones.    Past Medical History:  Diagnosis Date   Anxiety    Arthritis    Asthma    greater than 20 years ago    Cancer ([place])    hx of skin cancer on back    Complication of anesthesia    Nausea  & vomiting during eye surgery   Depression    DVT (deep venous thrombosis) ([place])    12/2011    Family history of adverse reaction to anesthesia    sister has problems with nausea and vomiting    GERD (gastroesophageal reflux disease)    H/O varicella    Hydradenitis 04/14/11   Hypertension    hx of hypertension no longer on meds    Menopausal symptoms 10/14/10   Menses, irregular 07/02/10   PONV (postoperative nausea and vomiting)    Vulvar itching 04/14/11   Yeast infection      Family History  Problem Relation Age of Onset   Hypertension Paternal Grandmother    Cancer Father    Hypertension Mother    Breast cancer Neg Hx      Current Outpatient Medications:    acetaminophen (TYLENOL) 500 MG tablet, Take 650 mg by mouth every 6 (six) hours as needed for mild pain. , Disp: , Rfl:    ALPRAZolam (XANAX) 1 MG tablet, Take 1 mg by mouth daily as needed., Disp: , Rfl:    amLODipine (NORVASC) 10 MG tablet, Take 1 tablet (10 mg total)  by mouth daily., Disp: 90 tablet, Rfl: 3   buPROPion (WELLBUTRIN XL) 150 MG 24 hr tablet, Take 450 mg by mouth every morning. Patient takes 2 tablets daily., Disp: , Rfl:    methylphenidate 36 MG PO CR tablet, Take 36 mg by mouth daily., Disp: , Rfl:    omeprazole (PRILOSEC) 20 MG capsule, Take 20 mg by mouth daily., Disp: , Rfl:    Probiotic Product (PROBIOTIC DAILY PO), Take 1 capsule by mouth daily at 12 noon., Disp: , Rfl:    traZODone (DESYREL) 100 MG tablet, Take 100 mg by mouth at bedtime as needed for sleep. , Disp: , Rfl:    vortioxetine HBr (TRINTELLIX) 20 MG TABS tablet, Take 20 mg by mouth daily., Disp: , Rfl:    benzonatate (TESSALON) 100 MG capsule, TAKE 1 CAPSULE (100 MG TOTAL) BY MOUTH EVERY 6 (SIX) HOURS AS NEEDED  FOR COUGH (Patient not taking: Reported on 02/26/2021), Disp: 30 capsule, Rfl: 1   budesonide-formoterol (SYMBICORT) 80-4.5 MCG/ACT inhaler, Inhale 2 puffs into the lungs 2 (two) times daily. (Patient not taking: Reported on 02/26/2021), Disp: 1 each, Rfl: 3   fexofenadine (ALLEGRA) 180 MG tablet, Take 180 mg by mouth daily. (Patient not taking: Reported on 05/20/2021), Disp: , Rfl:    ondansetron (ZOFRAN) 4 MG tablet, TAKE 1 TABLET BY MOUTH DAILY AS NEEDED FOR NAUSEA OR VOMITING., Disp: 9 tablet, Rfl: 6   sulfamethoxazole-trimethoprim (BACTRIM DS) 800-160 MG tablet, Take 1 tablet by mouth 2 (two) times daily., Disp: 10 tablet, Rfl: 0  Current Facility-Administered Medications:    albuterol (PROVENTIL HFA;VENTOLIN HFA) 108 (90 Base) MCG/ACT inhaler 2 puff, 2 puff, Inhalation, Once, Minette Brine, FNP   triamcinolone acetonide (KENALOG-40) injection 60 mg, 60 mg, Intramuscular, Once, Minette Brine, FNP   Allergies  Allergen Reactions   Doxycycline Nausea And Vomiting   Dust Mite Extract     UNKNOWN   Mold Extract [Trichophyton Mentagrophyte]     UNKNOWN   Nsaids Other (See Comments)    GI UPSET    Tetracyclines & Related Nausea And Vomiting     Review of Systems  Constitutional: Negative.  Negative for chills.  Respiratory: Negative.    Cardiovascular: Negative.   Gastrointestinal:  Negative for nausea and vomiting.  Genitourinary:  Positive for frequency, hesitancy and urgency. Negative for flank pain.  Neurological: Negative.   Psychiatric/Behavioral: Negative.      Today's Vitals   05/20/21 1431  BP: 122/86  Pulse: 80  Temp: 98.1 F (36.7 C)  Weight: (!) 406 lb 6.4 oz (184.3 kg)  Height: 5\' 10"  (1.778 m)  PainSc: 0-No pain   Body mass index is 58.31 kg/m.  Wt Readings from Last 3 Encounters:  05/20/21 (!) 406 lb 6.4 oz (184.3 kg)  02/26/21 (!) 424 lb 3.2 oz (192.4 kg)  02/18/21 (!) 420 lb (190.5 kg)    Objective:  Physical Exam Vitals reviewed.  Constitutional:       General: She is not in acute distress.    Appearance: Normal appearance.  Cardiovascular:     Rate and Rhythm: Normal rate and regular rhythm.     Pulses: Normal pulses.     Heart sounds: Normal heart sounds. No murmur heard. Pulmonary:     Effort: Pulmonary effort is normal. No respiratory distress.     Breath sounds: Normal breath sounds. No wheezing.  Neurological:     General: No focal deficit present.     Mental Status: She is alert and oriented to person,  place, and time.     Cranial Nerves: No cranial nerve deficit.     Motor: No weakness.  Psychiatric:        Mood and Affect: Mood normal.        Behavior: Behavior normal.        Thought Content: Thought content normal.        Judgment: Judgment normal.        Assessment And Plan:     1. Dysuria Comments: Urinalysis is positive for blood, will send for culture due to symptoms.  Will treat with antibiotics pending culture - POCT Urinalysis Dipstick (81002) - nitrofurantoin, macrocrystal-monohydrate, (MACROBID) 100 MG capsule; Take 1 capsule (100 mg total) by mouth 2 (two) times daily for 5 days.  Dispense: 10 capsule; Refill: 0  2. Hematuria, unspecified type - Urine Culture - nitrofurantoin, macrocrystal-monohydrate, (MACROBID) 100 MG capsule; Take 1 capsule (100 mg total) by mouth 2 (two) times daily for 5 days.  Dispense: 10 capsule; Refill: 0  3. Seasonal allergies Comments: Pending labs of urine culture will treat wtih kenalog 60 mg injection  4. Immunization due  5. Class 3 severe obesity due to excess calories with serious comorbidity and body mass index (BMI) of 50.0 to 59.9 in adult [facility]) Comments: Congratulated on losing approximately 18 lbs, continue healthy diet and regular excercise.     Patient was given opportunity to ask questions. Patient verbalized understanding of the plan and was able to repeat key elements of the plan. All questions were answered to their satisfaction.  Minette Brine, FNP   I,  Minette Brine, FNP, have reviewed all documentation for this visit. The documentation on 06/10/21 for the exam, diagnosis, procedures, and orders are all accurate and complete.   IF YOU HAVE BEEN REFERRED TO A SPECIALIST, IT MAY TAKE 1-2 WEEKS TO SCHEDULE/PROCESS THE REFERRAL. IF YOU HAVE NOT HEARD FROM US/SPECIALIST IN TWO WEEKS, PLEASE GIVE [place] A CALL AT [phone].   THE PATIENT IS ENCOURAGED TO PRACTICE SOCIAL DISTANCING DUE TO THE COVID-19 PANDEMIC.

## 2021-05-21 ENCOUNTER — Ambulatory Visit: Payer: 59 | Admitting: Nurse Practitioner

## 2021-05-22 ENCOUNTER — Other Ambulatory Visit: Payer: Self-pay | Admitting: Nurse Practitioner

## 2021-05-22 DIAGNOSIS — R112 Nausea with vomiting, unspecified: Secondary | ICD-10-CM

## 2021-05-25 LAB — URINE CULTURE

## 2021-05-26 ENCOUNTER — Encounter: Payer: Self-pay | Admitting: Nurse Practitioner

## 2021-06-02 ENCOUNTER — Other Ambulatory Visit: Payer: Self-pay | Admitting: Nurse Practitioner

## 2021-06-02 DIAGNOSIS — Z1231 Encounter for screening mammogram for malignant neoplasm of breast: Secondary | ICD-10-CM

## 2021-06-03 ENCOUNTER — Telehealth: Payer: Self-pay

## 2021-06-03 ENCOUNTER — Encounter: Payer: Self-pay | Admitting: Nurse Practitioner

## 2021-06-03 NOTE — Telephone Encounter (Signed)
Letter has been sent to patient instructing them to call us if they are still interested in completing their sleep study. If we have not received a response from the patient within 30 days of this notice, the order will be cancelled and they will need to discuss the need for a sleep study at their next office visit.  ° °

## 2021-06-04 ENCOUNTER — Other Ambulatory Visit (INDEPENDENT_AMBULATORY_CARE_PROVIDER_SITE_OTHER): Payer: 59

## 2021-06-04 ENCOUNTER — Other Ambulatory Visit: Payer: Self-pay | Admitting: Nurse Practitioner

## 2021-06-04 ENCOUNTER — Other Ambulatory Visit: Payer: Self-pay

## 2021-06-04 DIAGNOSIS — N390 Urinary tract infection, site not specified: Secondary | ICD-10-CM

## 2021-06-04 DIAGNOSIS — R350 Frequency of micturition: Secondary | ICD-10-CM

## 2021-06-04 LAB — POCT URINALYSIS DIPSTICK
Bilirubin, UA: NEGATIVE
Glucose, UA: NEGATIVE
Nitrite, UA: POSITIVE
Protein, UA: POSITIVE — AB
Spec Grav, UA: 1.02 (ref 1.010–1.025)
Urobilinogen, UA: 1 E.U./dL
pH, UA: 6 (ref 5.0–8.0)

## 2021-06-04 MED ORDER — SULFAMETHOXAZOLE-TRIMETHOPRIM 800-160 MG PO TABS: 1.0000 | ORAL_TABLET | Freq: Two times a day (BID) | ORAL | 0 refills | Status: DC

## 2021-06-04 NOTE — Progress Notes (Signed)
Did repeat urine due to urinary symptoms worsening, has small leuk, positive nitrites, trace blood and ketones. Ordered bactrim and will send for culture.

## 2021-06-07 LAB — URINE CULTURE

## 2021-06-09 ENCOUNTER — Encounter: Payer: Self-pay | Admitting: Nurse Practitioner

## 2021-06-10 ENCOUNTER — Encounter: Payer: Self-pay | Admitting: Nurse Practitioner

## 2021-06-15 IMAGING — CT CT CARDIAC CORONARY ARTERY CALCIUM SCORE
3 series · 14 of 20 positions shown, 15 images · non-contrast
Comparison: None.
COMPARISON: None.

Addendum:
EXAM:
OVER-READ INTERPRETATION  CT CHEST

The following report is an over-read performed by radiologist Dr.
over-read does not include interpretation of cardiac or coronary
anatomy or pathology. The coronary calcium score interpretation by
the cardiologist is attached.
CLINICAL DATA: Risk stratification: 59 Year-old White Female
Coronary Calcium Score
TECHNIQUE: The patient was scanned on a Siemens Force scanner. Axial
non-contrast 3 mm slices were carried out through the heart. The
data set was analyzed on a dedicated work station and scored using
the Agatson method.

[Series 2: casc 3.0 bv41 2 bestdiast 74 % · axial · 0.49mm/px · z∈[-260,-176]mm · 4 of 48 slices shown, 5 images]
[im 10/48  vessel]
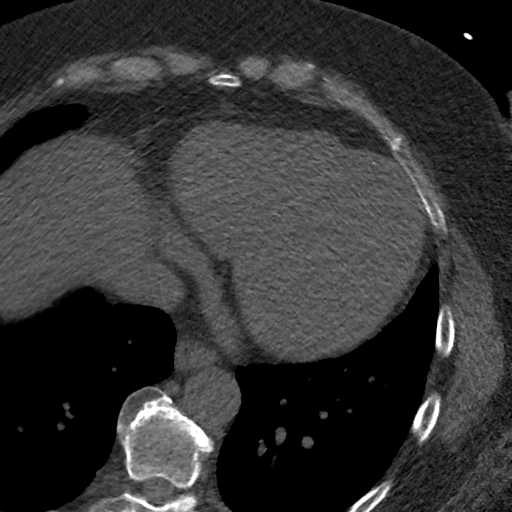
[im 10/48  lung]
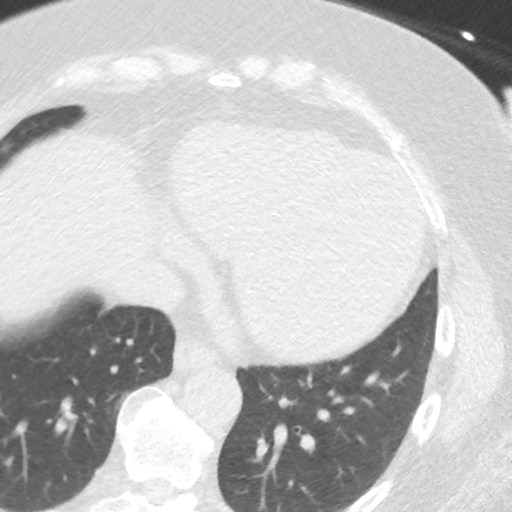
[im 19/48  vessel]
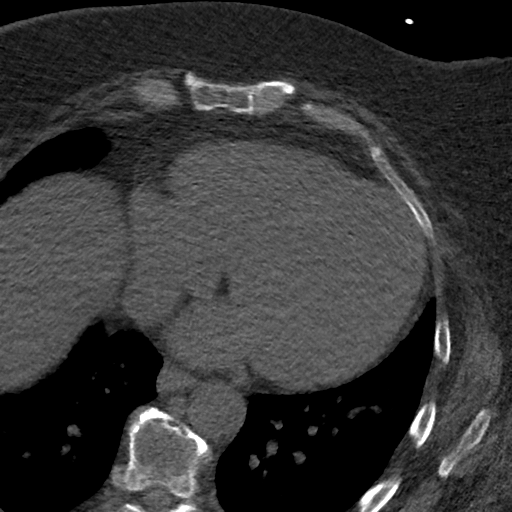
[im 29/48  vessel]
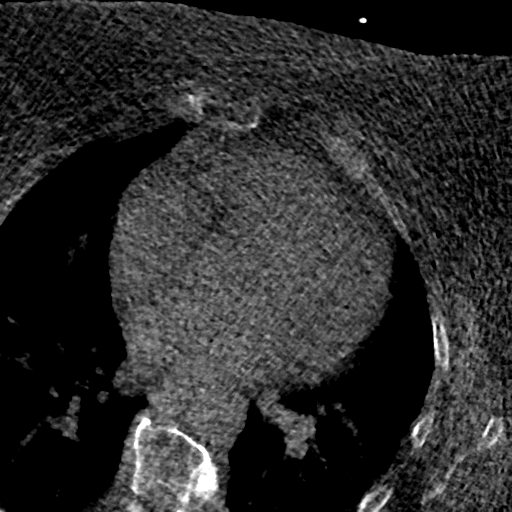
[im 38/48  vessel]
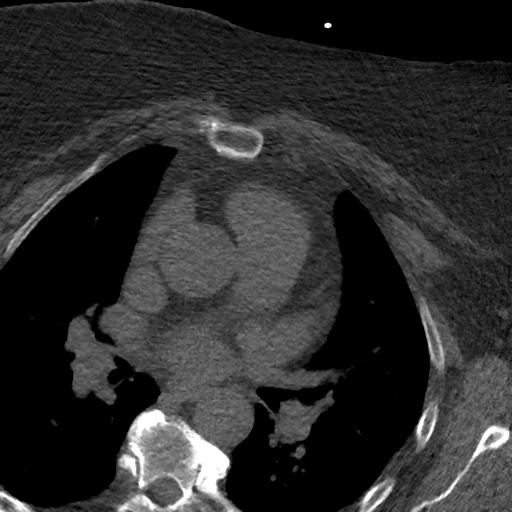

[Series 3: lung 74 % · axial · 0.75mm/px · z∈[-266,-170]mm · 5 of 48 slices shown]
[im 8/48  lung]
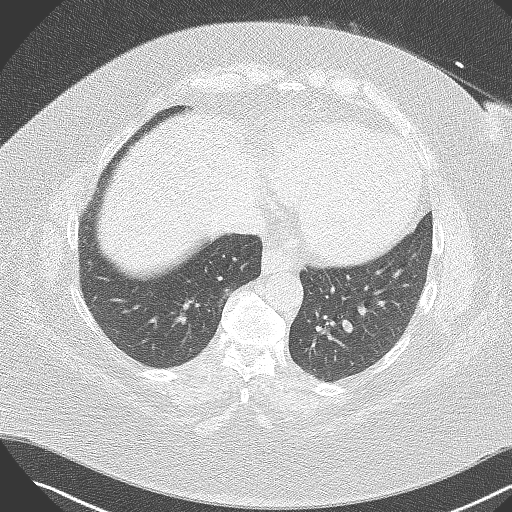
[im 16/48  lung]
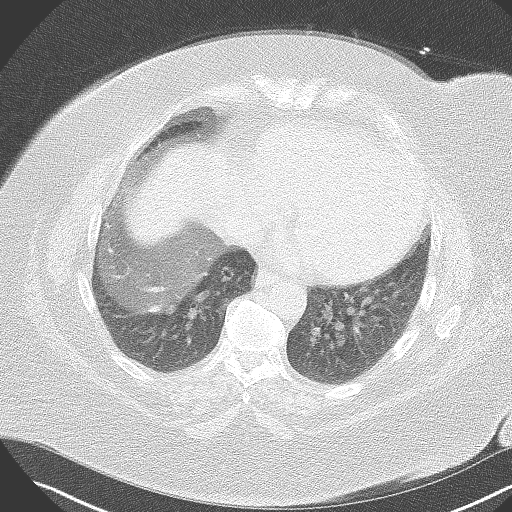
[im 24/48  lung]
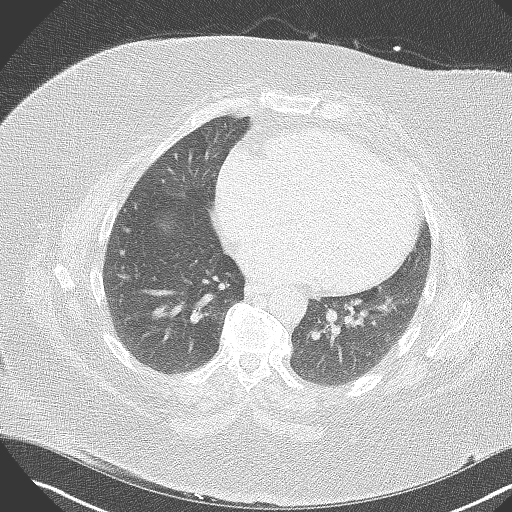
[im 32/48  lung]
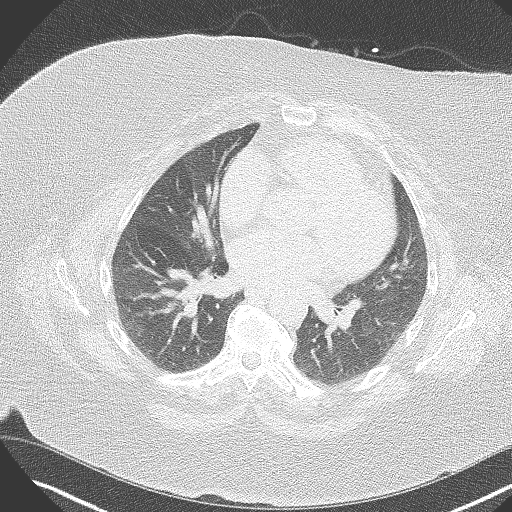
[im 40/48  lung]
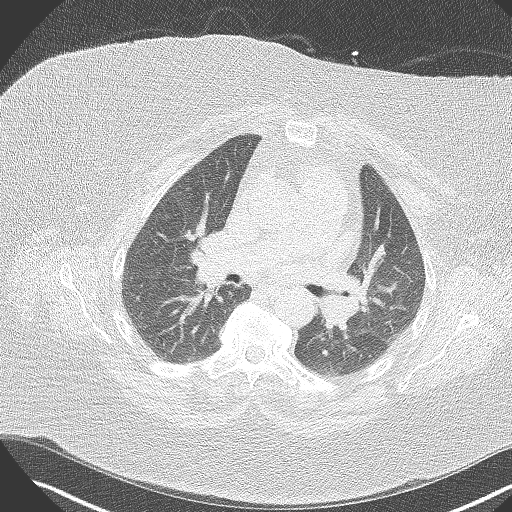

[Series 4: lung st 74 % · axial · 0.75mm/px · z∈[-266,-170]mm · 5 of 48 slices shown]
[im 8/48  lung]
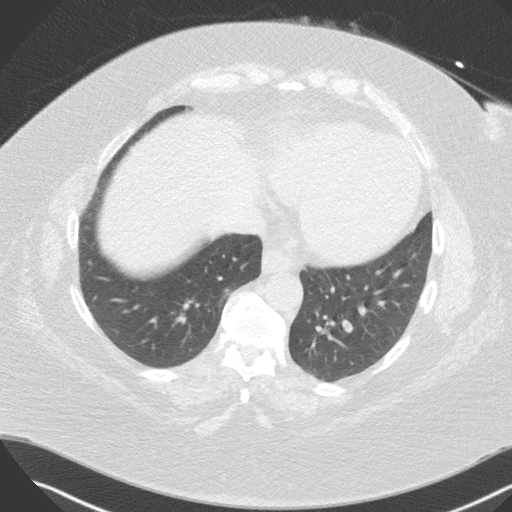
[im 16/48  lung]
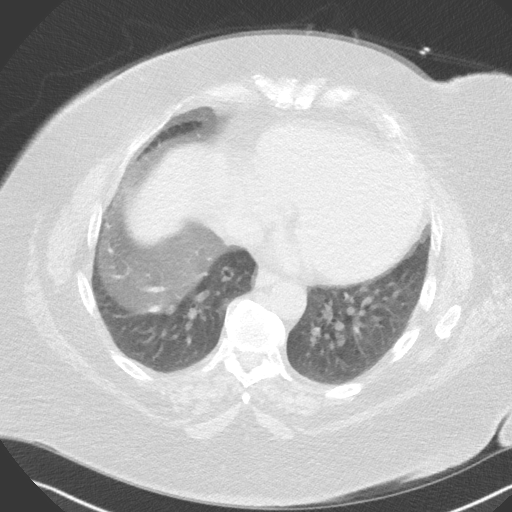
[im 24/48  lung]
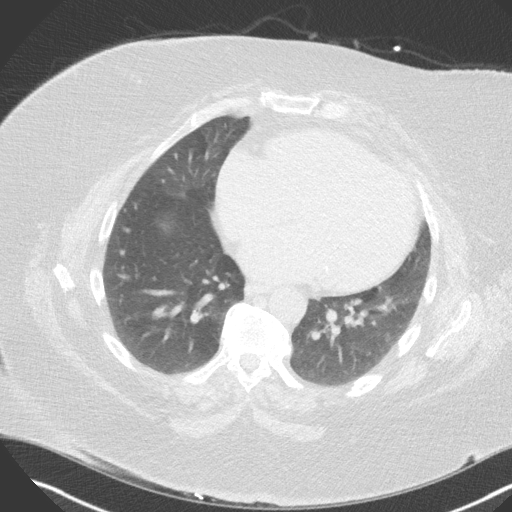
[im 32/48  lung]
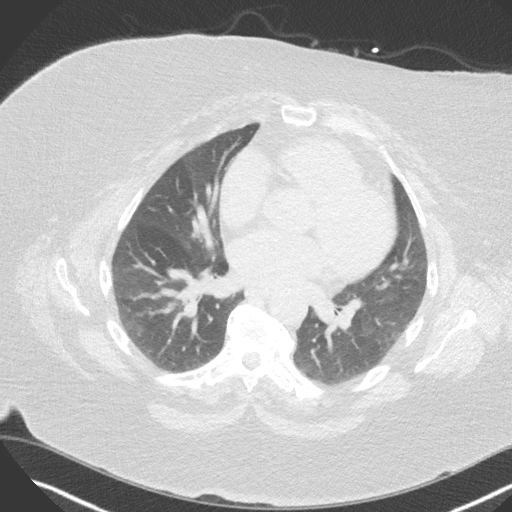
[im 40/48  lung]
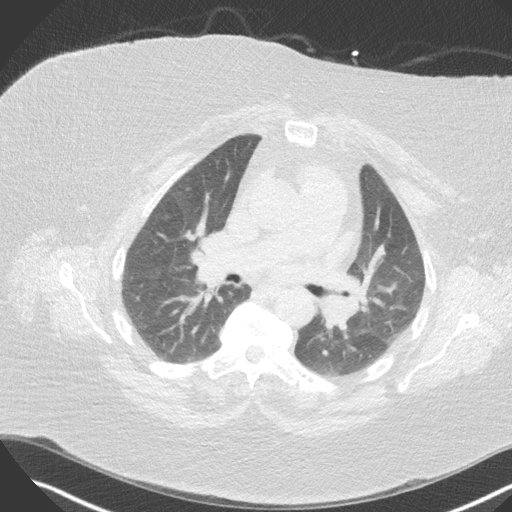

[14 of 20 positions shown; findings below may reference images not displayed]

FINDINGS: Vascular: No noncardiac vascular findings.

Mediastinum/Nodes: Visualized mediastinum and hilar regions
demonstrate no lymphadenopathy or masses.

Lungs/Pleura: Visualized lungs show no evidence of pulmonary edema,
consolidation, pneumothorax, nodule or pleural fluid.

Upper Abdomen: No acute abnormality.

Musculoskeletal: No chest wall mass or suspicious bone lesions
identified.
IMPRESSION: No significant incidental findings.
FINDINGS: Non-cardiac: See separate report from [REDACTED].

Ascending Aorta: 39 mm maximum diameter in non-contrast study.

Pericardium: Normal.

Coronary arteries: Normal origins.

Coronary Calcium Score:

Left main: 0

Left anterior descending artery: 0

Left circumflex artery: 0

Right coronary artery: 0

Total: 0

Percentile: 1st for age, sex, and race matched control.
IMPRESSION: 1. Coronary calcium score of 0. This was 1st percentile for age,
gender, and race matched controls.

2. Ascending Aorta: 39 mm maximum diameter in non-contrast study.
This is within the upper limit of normal when accounting for age and
body surface area last reported of 2.44 m2.

RECOMMENDATIONS:

Coronary artery calcium (CAC) score is a strong predictor of
incident coronary heart disease (CHD) and provides predictive
information beyond traditional risk factors. CAC scoring is
reasonable to use in the decision to withhold, postpone, or initiate
statin therapy in intermediate-risk or selected borderline-risk
asymptomatic adults (age 40-75 years and LDL-C ?70 to <190 mg/dL)
who do not have diabetes or established atherosclerotic
cardiovascular disease (ASCVD).* In intermediate-risk (10-year ASCVD
risk ?7.5% to <20%) adults or selected borderline-risk (10-year
ASCVD risk ?5% to <7.5%) adults in whom a CAC score is measured for
the purpose of making a treatment decision the following
recommendations have been made:

If CAC = 0, it is reasonable to withhold statin therapy and reassess
in 5 to 10 years, as long as higher risk conditions are absent
(diabetes mellitus, family history of premature CHD in first degree
relatives (males <55 years; females <65 years), cigarette smoking,
LDL ?190 mg/dL or other independent risk factors).

If CAC is 1 to 99, it is reasonable to initiate statin therapy for
patients ?55 years of age.

If CAC is ?100 or ?75th percentile, it is reasonable to initiate
statin therapy at any age.

Cardiology referral should be considered for patients with CAC
scores ?400 or ?75th percentile.

*0894 AHA/ACC/AACVPR/AAPA/ABC/BILLIOT/LHOSAN/DANYA/[place]/NITTI/DOT/AL TAAMNEH
Guideline on the Management of Blood Cholesterol: A Report of the
American College of Cardiology/American Heart Association Task Force
on Clinical Practice Guidelines. J Am Coll Cardiol.
3127;73(24):1113-1044.

*** End of Addendum ***
EXAM:
OVER-READ INTERPRETATION  CT CHEST

The following report is an over-read performed by radiologist Dr.
over-read does not include interpretation of cardiac or coronary
anatomy or pathology. The coronary calcium score interpretation by
the cardiologist is attached.
FINDINGS: Vascular: No noncardiac vascular findings.

Mediastinum/Nodes: Visualized mediastinum and hilar regions
demonstrate no lymphadenopathy or masses.

Lungs/Pleura: Visualized lungs show no evidence of pulmonary edema,
consolidation, pneumothorax, nodule or pleural fluid.

Upper Abdomen: No acute abnormality.

Musculoskeletal: No chest wall mass or suspicious bone lesions
identified.
IMPRESSION: No significant incidental findings.

## 2021-07-09 ENCOUNTER — Ambulatory Visit
Admission: RE | Admit: 2021-07-09 | Discharge: 2021-07-09 | Disposition: A | Discharge status: Home / Self Care | Payer: 59 | Source: Ambulatory Visit

## 2021-07-09 DIAGNOSIS — Z1231 Encounter for screening mammogram for malignant neoplasm of breast: Secondary | ICD-10-CM

## 2021-09-04 ENCOUNTER — Encounter: Payer: Self-pay | Admitting: Cardiology

## 2021-10-23 ENCOUNTER — Ambulatory Visit: Payer: 59 | Admitting: Nurse Practitioner

## 2021-10-23 ENCOUNTER — Encounter: Payer: Self-pay | Admitting: Nurse Practitioner

## 2021-10-23 VITALS — BP 134/72 | HR 73 | Temp 98.4°F | Ht 70.0 in | Wt >= 6400 oz

## 2021-10-23 DIAGNOSIS — R051 Acute cough: Secondary | ICD-10-CM | POA: Diagnosis not present

## 2021-10-23 DIAGNOSIS — I10 Essential (primary) hypertension: Secondary | ICD-10-CM | POA: Diagnosis not present

## 2021-10-23 DIAGNOSIS — Z6841 Body Mass Index (BMI) 40.0 and over, adult: Secondary | ICD-10-CM

## 2021-10-23 DIAGNOSIS — R0981 Nasal congestion: Secondary | ICD-10-CM | POA: Diagnosis not present

## 2021-10-23 DIAGNOSIS — Z1211 Encounter for screening for malignant neoplasm of colon: Secondary | ICD-10-CM

## 2021-10-23 MED ORDER — TRIAMCINOLONE ACETONIDE 40 MG/ML IJ SUSP
60.0000 mg | Freq: Once | INTRAMUSCULAR | Status: AC
  Administered 2021-10-23: 60 mg via INTRAMUSCULAR

## 2021-10-23 MED ORDER — AZELASTINE HCL 0.1 % NA SOLN: 2.0000 | Freq: Two times a day (BID) | NASAL | 5 refills | Status: DC

## 2021-10-23 NOTE — Progress Notes (Signed)
?Industrial/product designer as a Education administrator for [employer], FNP.,have documented all relevant documentation on the behalf of Minette Brine, FNP,as directed by  Minette Brine, FNP while in the presence of Minette Brine, [place]. ? ?This visit occurred during the SARS-CoV-2 public health emergency.  Safety protocols were in place, including screening questions prior to the visit, additional usage of staff PPE, and extensive cleaning of exam room while observing appropriate contact time as indicated for disinfecting solutions. ? ?Subjective:  ?  ? Patient ID: Kendra Haney , female    DOB: 08-Jun-1961 , 61 y.o.   MRN: [id number] ? ? ?Chief Complaint  ?Patient presents with  ? Cough  ? ? ?HPI ? ?Patient presents today for cough. She has been to an allergist.  ? ?Cough ?This is a chronic problem. The current episode started more than 1 year ago. Associated symptoms include rhinorrhea and a sore throat.   ? ?Past Medical History:  ?Diagnosis Date  ? Anxiety   ? Arthritis   ? Asthma   ? greater than 20 years ago   ? Cancer [facility])   ? hx of skin cancer on back   ? Complication of anesthesia   ? Nausea  & vomiting during eye surgery  ? Depression   ? DVT (deep venous thrombosis) ([place])   ? 12/2011   ? Family history of adverse reaction to anesthesia   ? sister has problems with nausea and vomiting   ? GERD (gastroesophageal reflux disease)   ? H/O varicella   ? Hydradenitis 04/14/11  ? Hypertension   ? hx of hypertension no longer on meds   ? Menopausal symptoms 10/14/10  ? Menses, irregular 07/02/10  ? PONV (postoperative nausea and vomiting)   ? Vulvar itching 04/14/11  ? Yeast infection   ?  ? ?Family History  ?Problem Relation Age of Onset  ? Hypertension Paternal Grandmother   ? Cancer Father   ? Hypertension Mother   ? Breast cancer Neg Hx   ? ? ? ?Current Outpatient Medications:  ?  acetaminophen (TYLENOL) 500 MG tablet, Take 650 mg by mouth every 6 (six) hours as needed for mild pain. , Disp: , Rfl:  ?  ALPRAZolam (XANAX) 1 MG tablet,  Take 1 mg by mouth daily as needed., Disp: , Rfl:  ?  amLODipine (NORVASC) 10 MG tablet, Take 1 tablet (10 mg total) by mouth daily., Disp: 90 tablet, Rfl: 3 ?  azelastine (ASTELIN) 0.1 % nasal spray, Place 2 sprays into both nostrils 2 (two) times daily. Use in each nostril as directed, Disp: 30 mL, Rfl: 5 ?  buPROPion (WELLBUTRIN XL) 150 MG 24 hr tablet, Take 450 mg by mouth every morning. Patient takes 2 tablets daily., Disp: , Rfl:  ?  fexofenadine (ALLEGRA) 180 MG tablet, Take 180 mg by mouth daily., Disp: , Rfl:  ?  methylphenidate 36 MG PO CR tablet, Take 36 mg by mouth daily., Disp: , Rfl:  ?  omeprazole (PRILOSEC) 20 MG capsule, Take 20 mg by mouth daily., Disp: , Rfl:  ?  ondansetron (ZOFRAN) 4 MG tablet, TAKE 1 TABLET BY MOUTH DAILY AS NEEDED FOR NAUSEA OR VOMITING., Disp: 9 tablet, Rfl: 6 ?  Probiotic Product (PROBIOTIC DAILY PO), Take 1 capsule by mouth daily at 12 noon., Disp: , Rfl:  ?  traZODone (DESYREL) 100 MG tablet, Take 100 mg by mouth at bedtime as needed for sleep. , Disp: , Rfl:  ?  vortioxetine HBr (TRINTELLIX) 20 MG TABS tablet, Take  20 mg by mouth daily., Disp: , Rfl:  ? ?Current Facility-Administered Medications:  ?  albuterol (PROVENTIL HFA;VENTOLIN HFA) 108 (90 Base) MCG/ACT inhaler 2 puff, 2 puff, Inhalation, Once, Minette Brine, FNP ?  triamcinolone acetonide (KENALOG-40) injection 60 mg, 60 mg, Intramuscular, Once, Minette Brine, FNP ?  triamcinolone acetonide (KENALOG-40) injection 60 mg, 60 mg, Intramuscular, Once, Minette Brine, FNP  ? ?Allergies  ?Allergen Reactions  ? Doxycycline Nausea And Vomiting  ? Dust Mite Extract   ?  UNKNOWN  ? Mold Extract [Trichophyton Mentagrophyte]   ?  UNKNOWN  ? Nsaids Other (See Comments)  ?  GI UPSET   ? Tetracyclines & Related Nausea And Vomiting  ?  ? ?Review of Systems  ?Constitutional: Negative.   ?HENT:  Positive for congestion, rhinorrhea and sore throat.   ?Respiratory:  Positive for cough.   ?Cardiovascular: Negative.   ?Gastrointestinal:  Negative.   ?Neurological: Negative.    ? ?Today's Vitals  ? 10/23/21 1237  ?BP: 134/72  ?Pulse: 73  ?Temp: 98.4 ?F (36.9 ?C)  ?TempSrc: Oral  ?Weight: (!) 411 lb (186.4 kg)  ?Height: '5\' 10"'  (1.778 m)  ? ?Body mass index is 58.97 kg/m?.  ?Wt Readings from Last 3 Encounters:  ?10/23/21 (!) 411 lb (186.4 kg)  ?05/20/21 (!) 406 lb 6.4 oz (184.3 kg)  ?02/26/21 (!) 424 lb 3.2 oz (192.4 kg)  ? ? ?Objective:  ?Physical Exam ?Constitutional:   ?   General: She is not in acute distress. ?   Appearance: Normal appearance. She is well-developed. She is obese.  ?HENT:  ?   Right Ear: Tympanic membrane, ear canal and external ear normal. There is no impacted cerumen.  ?   Left Ear: Tympanic membrane, ear canal and external ear normal. There is no impacted cerumen.  ?   Nose: Nose normal.  ?   Right Sinus: No maxillary sinus tenderness or frontal sinus tenderness.  ?   Left Sinus: No maxillary sinus tenderness or frontal sinus tenderness.  ?Eyes:  ?   Pupils: Pupils are equal, round, and reactive to light.  ?Cardiovascular:  ?   Rate and Rhythm: Normal rate and regular rhythm.  ?   Pulses: Normal pulses.  ?   Heart sounds: Normal heart sounds. No murmur heard. ?Pulmonary:  ?   Effort: Pulmonary effort is normal. No respiratory distress.  ?   Breath sounds: Normal breath sounds. No wheezing.  ?Neurological:  ?   General: No focal deficit present.  ?   Mental Status: She is alert and oriented to person, place, and time.  ?   Cranial Nerves: No cranial nerve deficit.  ?   Motor: No weakness.  ?Psychiatric:     ?   Mood and Affect: Mood normal. Mood is not anxious.     ?   Speech: Speech normal.     ?   Behavior: Behavior normal.     ?   Thought Content: Thought content normal.     ?   Judgment: Judgment normal.  ?  ? ?   ?Assessment And Plan:  ?   ?1. Acute cough ?Comments: This has been persistent with the recent  ?- Ambulatory referral to Allergy ?- azelastine (ASTELIN) 0.1 % nasal spray; Place 2 sprays into both nostrils 2 (two)  times daily. Use in each nostril as directed  Dispense: 30 mL; Refill: 5 ?- CMP14+EGFR ? ?2. Nasal congestion ?Comments: She has this each season and due to the frequency will refer to  allergy specialist to evaluate further to avoid utilizing steroid injection ?- triamcinolone acetonide (KENALOG-40) injection 60 mg ?- CMP14+EGFR ? ?3. Essential hypertension, benign ?Comments: Blood pressure well controlled, continue current mediations ?- CMP14+EGFR ? ?4. Class 3 severe obesity due to excess calories with serious comorbidity and body mass index (BMI) of 50.0 to 59.9 in adult [facility]) ?She is encouraged to strive for BMI less than 30 to decrease cardiac risk. Advised to aim for at least 150 minutes of exercise per week. ? ?5. Colon cancer screening ?Comments: She is willing to do Cologuard. She is aware if positive she will need to have a colonoscopy done ?- Cologuard ? ? ?Patient was given opportunity to ask questions. Patient verbalized understanding of the plan and was able to repeat key elements of the plan. All questions were answered to their satisfaction.  ?Minette Brine, FNP  ? ?I, Minette Brine, FNP, have reviewed all documentation for this visit. The documentation on 10/23/21 for the exam, diagnosis, procedures, and orders are all accurate and complete.  ? ?IF YOU HAVE BEEN REFERRED TO A SPECIALIST, IT MAY TAKE 1-2 WEEKS TO SCHEDULE/PROCESS THE REFERRAL. IF YOU HAVE NOT HEARD FROM US/SPECIALIST IN TWO WEEKS, PLEASE GIVE [place] A CALL AT [phone] X [phone].  ? ?THE PATIENT IS ENCOURAGED TO PRACTICE SOCIAL DISTANCING DUE TO THE COVID-19 PANDEMIC.   ?

## 2021-10-23 NOTE — Patient Instructions (Signed)

## 2021-11-06 ENCOUNTER — Other Ambulatory Visit: Payer: Self-pay | Admitting: Nurse Practitioner

## 2021-11-06 ENCOUNTER — Encounter: Payer: Self-pay | Admitting: Nurse Practitioner

## 2021-11-06 MED ORDER — BENZONATATE 100 MG PO CAPS: 100.0000 mg | ORAL_CAPSULE | Freq: Four times a day (QID) | ORAL | 1 refills | Status: DC | PRN

## 2021-11-07 ENCOUNTER — Other Ambulatory Visit: Payer: Self-pay

## 2021-11-07 MED ORDER — ALBUTEROL SULFATE HFA 108 (90 BASE) MCG/ACT IN AERS
2.0000 | INHALATION_SPRAY | Freq: Four times a day (QID) | RESPIRATORY_TRACT | 1 refills | Status: DC | PRN
Start: 2021-11-07 — End: 2022-05-07

## 2021-11-13 ENCOUNTER — Encounter: Payer: Self-pay | Admitting: Nurse Practitioner

## 2021-11-13 ENCOUNTER — Ambulatory Visit: Payer: 59 | Admitting: Nurse Practitioner

## 2021-11-13 VITALS — BP 128/70 | HR 75 | Temp 98.1°F | Ht 70.0 in | Wt >= 6400 oz

## 2021-11-13 DIAGNOSIS — R635 Abnormal weight gain: Secondary | ICD-10-CM | POA: Diagnosis not present

## 2021-11-13 DIAGNOSIS — I10 Essential (primary) hypertension: Secondary | ICD-10-CM | POA: Diagnosis not present

## 2021-11-13 DIAGNOSIS — Z6841 Body Mass Index (BMI) 40.0 and over, adult: Secondary | ICD-10-CM

## 2021-11-13 DIAGNOSIS — E66813 Obesity, class 3: Secondary | ICD-10-CM

## 2021-11-13 NOTE — Progress Notes (Signed)
?Industrial/product designer as a Education administrator for [employer], FNP.,have documented all relevant documentation on the behalf of Minette Brine, FNP,as directed by  Minette Brine, FNP while in the presence of Minette Brine, [place]. ? ?This visit occurred during the SARS-CoV-2 public health emergency.  Safety protocols were in place, including screening questions prior to the visit, additional usage of staff PPE, and extensive cleaning of exam room while observing appropriate contact time as indicated for disinfecting solutions. ? ?Subjective:  ?  ? Patient ID: Kendra Haney , female    DOB: [birth year] , 61 y.o.   MRN: [id number] ? ? ?Chief Complaint  ?Patient presents with  ? Obesity  ? ? ?HPI ? ?Patient presents today for weight.  She had knee surgery in 2016 for her knee surgery. She needs some help with getting back on track. In 2017 she was about a size 24, then in 2020 her weight went up. She is unable to buy regular clothes out of the store.  She is doing yard work but no active yard work. When she walks her right foot goes outwards. She is not doing any additional walking. She is seeing Jeani Hawking (therapist) since August. She also works a sedentary job. Her new home she has to do the yard work.  ? ?She feels that some of her challenges with weight is emotional. She is seeing a therapist.  ? ?She tried a medication for weight loss but unsure of the name several years ago.    ? ?Wt Readings from Last 3 Encounters: ?11/13/21 : (!) 408 lb (185.1 kg) ?10/23/21 : (!) 411 lb (186.4 kg) ?05/20/21 : (!) 406 lb 6.4 oz (184.3 kg) ? ? ? ? ?  ? ?Past Medical History:  ?Diagnosis Date  ? Anxiety   ? Arthritis   ? Asthma   ? greater than 20 years ago   ? Cancer [facility])   ? hx of skin cancer on back   ? Complication of anesthesia   ? Nausea  & vomiting during eye surgery  ? Depression   ? DVT (deep venous thrombosis) ([place])   ? 12/2011   ? Family history of adverse reaction to anesthesia   ? sister has problems with nausea and vomiting   ? GERD  (gastroesophageal reflux disease)   ? H/O varicella   ? Hydradenitis 04/14/11  ? Hypertension   ? hx of hypertension no longer on meds   ? Menopausal symptoms 10/14/10  ? Menses, irregular 07/02/10  ? PONV (postoperative nausea and vomiting)   ? Vulvar itching 04/14/11  ? Yeast infection   ?  ? ?Family History  ?Problem Relation Age of Onset  ? Hypertension Paternal Grandmother   ? Cancer Father   ? Hypertension Mother   ? Breast cancer Neg Hx   ? ? ? ?Current Outpatient Medications:  ?  acetaminophen (TYLENOL) 500 MG tablet, Take 650 mg by mouth every 6 (six) hours as needed for mild pain. , Disp: , Rfl:  ?  albuterol (VENTOLIN HFA) 108 (90 Base) MCG/ACT inhaler, Inhale 2 puffs into the lungs every 6 (six) hours as needed for wheezing or shortness of breath., Disp: 18 g, Rfl: 1 ?  ALPRAZolam (XANAX) 1 MG tablet, Take 1 mg by mouth daily as needed., Disp: , Rfl:  ?  amLODipine (NORVASC) 10 MG tablet, Take 1 tablet (10 mg total) by mouth daily., Disp: 90 tablet, Rfl: 3 ?  azelastine (ASTELIN) 0.1 % nasal spray, Place 2 sprays into both nostrils 2 (  two) times daily. Use in each nostril as directed, Disp: 30 mL, Rfl: 5 ?  benzonatate (TESSALON PERLES) 100 MG capsule, Take 1 capsule (100 mg total) by mouth every 6 (six) hours as needed., Disp: 30 capsule, Rfl: 1 ?  buPROPion (WELLBUTRIN XL) 150 MG 24 hr tablet, Take 450 mg by mouth every morning. Patient takes 2 tablets daily., Disp: , Rfl:  ?  fexofenadine (ALLEGRA) 180 MG tablet, Take 180 mg by mouth daily., Disp: , Rfl:  ?  methylphenidate 36 MG PO CR tablet, Take 36 mg by mouth daily., Disp: , Rfl:  ?  omeprazole (PRILOSEC) 20 MG capsule, Take 20 mg by mouth daily., Disp: , Rfl:  ?  ondansetron (ZOFRAN) 4 MG tablet, TAKE 1 TABLET BY MOUTH DAILY AS NEEDED FOR NAUSEA OR VOMITING., Disp: 9 tablet, Rfl: 6 ?  Probiotic Product (PROBIOTIC DAILY PO), Take 1 capsule by mouth daily at 12 noon., Disp: , Rfl:  ?  traZODone (DESYREL) 100 MG tablet, Take 100 mg by mouth at bedtime  as needed for sleep. , Disp: , Rfl:  ?  vortioxetine HBr (TRINTELLIX) 20 MG TABS tablet, Take 20 mg by mouth daily., Disp: , Rfl:  ?  VRAYLAR 3 MG capsule, Take 3 mg by mouth at bedtime., Disp: , Rfl:  ? ?Current Facility-Administered Medications:  ?  albuterol (PROVENTIL HFA;VENTOLIN HFA) 108 (90 Base) MCG/ACT inhaler 2 puff, 2 puff, Inhalation, Once, Minette Brine, FNP ?  triamcinolone acetonide (KENALOG-40) injection 60 mg, 60 mg, Intramuscular, Once, Minette Brine, FNP  ? ?Allergies  ?Allergen Reactions  ? Doxycycline Nausea And Vomiting  ? Dust Mite Extract   ?  UNKNOWN  ? Mold Extract [Trichophyton Mentagrophyte]   ?  UNKNOWN  ? Nsaids Other (See Comments)  ?  GI UPSET   ? Tetracyclines & Related Nausea And Vomiting  ?  ? ?Review of Systems  ?Constitutional: Negative.   ?Respiratory: Negative.    ?Cardiovascular: Negative.   ?Gastrointestinal: Negative.   ?Neurological: Negative.   ?Psychiatric/Behavioral: Negative.     ? ?Today's Vitals  ? 11/13/21 1530  ?BP: 128/70  ?Pulse: 75  ?Temp: 98.1 ?F (36.7 ?C)  ?TempSrc: Oral  ?Weight: (!) 408 lb (185.1 kg)  ?Height: _0  (1.778 m)  ? ?Body mass index is 58.54 kg/m?.  ?Wt Readings from Last 3 Encounters:  ?11/13/21 (!) 408 lb (185.1 kg)  ?10/23/21 (!) 411 lb (186.4 kg)  ?05/20/21 (!) 406 lb 6.4 oz (184.3 kg)  ? ? ?Objective:  ?Physical Exam ?Vitals reviewed.  ?Constitutional:   ?   General: She is not in acute distress. ?   Appearance: Normal appearance.  ?Cardiovascular:  ?   Rate and Rhythm: Normal rate and regular rhythm.  ?   Pulses: Normal pulses.  ?   Heart sounds: Normal heart sounds. No murmur heard. ?Pulmonary:  ?   Effort: Pulmonary effort is normal. No respiratory distress.  ?   Breath sounds: Normal breath sounds. No wheezing.  ?Neurological:  ?   General: No focal deficit present.  ?   Mental Status: She is alert and oriented to person, place, and time.  ?   Cranial Nerves: No cranial nerve deficit.  ?   Motor: No weakness.  ?  ? ?   ?Assessment And  Plan:  ?   ?1. Essential hypertension, benign ?Comments: Blood pressure is controlled. Continue current medications ?- CMP14+EGFR ? ?2. Abnormal weight gain ?Comments: She has been having challenges with weight gain.  Will check  HgbA1c to make sure she has not transitioned to diabetes ?- Hemoglobin A1c ?- Insulin, random ?- Amb Referral To Provider Referral Exercise Program (P.R.E.P) ? ?3. Class 3 severe obesity due to excess calories with serious comorbidity and body mass index (BMI) of 50.0 to 59.9 in adult [facility]) ?Comments: I will refer her to PREP, given a brochure. Encouraged to at least do some activity for 5 minutes a day 3-5 days a week to start. I do feel she would benefit from losing weight. This may also help her right hip pain as well.   ?- Insulin, random ?- Amb Referral To Provider Referral Exercise Program (P.R.E.P) ? ? ? ?Patient was given opportunity to ask questions. Patient verbalized understanding of the plan and was able to repeat key elements of the plan. All questions were answered to their satisfaction.  ?Minette Brine, FNP  ? ?I, Minette Brine, FNP, have reviewed all documentation for this visit. The documentation on 11/13/21 for the exam, diagnosis, procedures, and orders are all accurate and complete.  ? ?IF YOU HAVE BEEN REFERRED TO A SPECIALIST, IT MAY TAKE 1-2 WEEKS TO SCHEDULE/PROCESS THE REFERRAL. IF YOU HAVE NOT HEARD FROM US/SPECIALIST IN TWO WEEKS, PLEASE GIVE [place] A CALL AT [phone].  ? ?THE PATIENT IS ENCOURAGED TO PRACTICE SOCIAL DISTANCING DUE TO THE COVID-19 PANDEMIC.   ?

## 2021-11-13 NOTE — Patient Instructions (Addendum)
Obesity, Adult ?Obesity is having too much body fat. Being obese means that your weight is more than what is healthy for you.  ?BMI (body mass index) is a number that explains how much body fat you have. If you have a BMI of 30 or more, you are obese. ?Obesity can cause serious health problems, such as: ?Stroke. ?Coronary artery disease (CAD). ?Type 2 diabetes. ?Some types of cancer. ?High blood pressure (hypertension). ?High cholesterol. ?Gallbladder stones. ?Obesity can also contribute to: ?Osteoarthritis. ?Sleep apnea. ?Infertility problems. ?What are the causes? ?Eating meals each day that are high in calories, sugar, and fat. ?Drinking a lot of drinks that have sugar in them. ?Being born with genes that may make you more likely to become obese. ?Having a medical condition that causes obesity. ?Taking certain medicines. ?Sitting a lot (having a sedentary lifestyle). ?Not getting enough sleep. ?What increases the risk? ?Having a family history of obesity. ?Living in an area with limited access to: ?Parks, recreation centers, or sidewalks. ?Healthy food choices, such as grocery stores and farmers' markets. ?What are the signs or symptoms? ?The main sign is having too much body fat. ?How is this treated? ?Treatment for this condition often includes changing your lifestyle. Treatment may include: ?Changing your diet. This may include making a healthy meal plan. ?Exercise. This may include activity that causes your heart to beat faster (aerobic exercise) and strength training. Work with your doctor to design a program that works for you. ?Medicine to help you lose weight. This may be used if you are not able to lose one pound a week after 6 weeks of healthy eating and more exercise. ?Treating conditions that cause the obesity. ?Surgery. Options may include gastric banding and gastric bypass. This may be done if: ?Other treatments have not helped to improve your condition. ?You have a BMI of 40 or higher. ?You have  life-threatening health problems related to obesity. ?Follow these instructions at home: ?Eating and drinking ? ?Follow advice from your doctor about what to eat and drink. Your doctor may tell you to: ?Limit fast food, sweets, and processed snack foods. ?Choose low-fat options. For example, choose low-fat milk instead of whole milk. ?Eat five or more servings of fruits or vegetables each day. ?Eat at home more often. This gives you more control over what you eat. ?Choose healthy foods when you eat out. ?Learn to read food labels. This will help you learn how much food is in one serving. ?Keep low-fat snacks available. ?Avoid drinks that have a lot of sugar in them. These include soda, fruit juice, iced tea with sugar, and flavored milk. ?Drink enough water to keep your pee (urine) pale yellow. ?Do not go on fad diets. ?Physical activity ?Exercise often, as told by your doctor. Most adults should get up to 150 minutes of moderate-intensity exercise every week.Ask your doctor: ?What types of exercise are safe for you. ?How often you should exercise. ?Warm up and stretch before being active. ?Do slow stretching after being active (cool down). ?Rest between times of being active. ?Lifestyle ?Work with your doctor and a food expert (dietitian) to set a weight-loss goal that is best for you. ?Limit your screen time. ?Find ways to reward yourself that do not involve food. ?Do not drink alcohol if: ?Your doctor tells you not to drink. ?You are pregnant, may be pregnant, or are planning to become pregnant. ?If you drink alcohol: ?Limit how much you have to: ?0-1 drink a day for women. ?0-2 drinks   a day for men. ?Know how much alcohol is in your drink. In the [place] one drink equals one 12 oz bottle of beer (355 mL), one 5 oz glass of wine (148 mL), or one 1? oz glass of hard liquor (44 mL). ?General instructions ?Keep a weight-loss journal. This can help you keep track of: ?The food that you eat. ?How much exercise you  get. ?Take over-the-counter and prescription medicines only as told by your doctor. ?Take vitamins and supplements only as told by your doctor. ?Think about joining a support group. ?Pay attention to your mental health as obesity can lead to depression or self esteem issues. ?Keep all follow-up visits. ?Contact a doctor if: ?You cannot meet your weight-loss goal after you have changed your diet and lifestyle for 6 weeks. ?You are having trouble breathing. ?Summary ?Obesity is having too much body fat. ?Being obese means that your weight is more than what is healthy for you. ?Work with your doctor to set a weight-loss goal. ?Get regular exercise as told by your doctor. ?This information is not intended to replace advice given to you by your health care provider. Make sure you discuss any questions you have with your health care provider. ?Document Revised: 02/04/2021 Document Reviewed: 02/04/2021 ?Elsevier Patient Education ? [place]. ? ? ?Sit-to-Stand Exercise ? ?The sit-to-stand exercise (also known as the chair stand or chair rise exercise) strengthens your lower body and helps you maintain or improve your mobility and independence. The end goal is to do the sit-to-stand exercise without using your hands. This will be easier as you become stronger. You should always talk with your health care provider before starting any exercise program, especially if you have had recent surgery. ?Do the exercise exactly as told by your health care provider and adjust it as directed. It is normal to feel mild stretching, pulling, tightness, or discomfort as you do this exercise, but you should stop right away if you feel sudden pain or your pain gets worse. Do not begin doing this exercise until told by your health care provider. ?What the sit-to-stand exercise does ?The sit-to-stand exercise helps to strengthen the muscles in your thighs and the muscles in the center of your body that give you stability (core muscles).  This exercise is especially helpful if: ?You have had knee or hip surgery. ?You have trouble getting up from a chair, out of a car, or off the toilet due to muscle weakness. ?How to do the sit-to-stand exercise ?Sit toward the front edge of a sturdy chair without armrests. Your knees should be bent and your feet should be flat on the floor and shoulder-width apart and underneath your hips. ?Place your hands lightly on each side of the seat. Keep your back and neck as straight as possible, with your chest slightly forward. ?Breathe in slowly. Lean forward and slightly shift your weight to the front of your feet. ?Breathe out as you slowly stand up. Try not to support any weight with your hands. ?Stand and pause for a full breath in and out. ?Breathe in as you sit down slowly. Tighten your core and abdominal muscles to control your lowering as much as possible. You should lower yourself back to the chair slowly, not just drop back into the seat. ?Breathe out slowly. ?Do this exercise 10-15 times. If needed, do it fewer times until you build up strength. ?Rest for 1 minute, then do another set of 10-15 repetitions. ?To change the difficulty of the sit-to-stand  exercise ?If the exercise is too difficult, use a chair with sturdy armrests, and push off the armrests to help you come to the standing position. You can also use the armrests to help slowly lower yourself back to sitting. As this gets easier, try to use your arms less. You can also place a firm cushion or pillow on the chair to make the surface higher. ?If this exercise is too easy, do not use your arms to help raise or lower yourself. You can also wear a weighted vest, use hand weights, increase your repetitions, or try a lower chair. ?General tips ?You may feel tired when starting an exercise routine. This is normal. ?You may have muscle soreness that lasts a few days. This is normal. As you get stronger, you may not feel muscle soreness. ?Use smooth, steady  movements. ?Do not  hold your breath during strength exercises. This can cause unsafe changes in your blood pressure. ?Breathe in slowly through your nose, and breathe out slowly through your mouth. ?Summar

## 2021-11-14 LAB — INSULIN, RANDOM: INSULIN: 21.9 u[IU]/mL (ref 2.6–24.9)

## 2021-11-14 LAB — CMP14+EGFR
ALT: 30 IU/L (ref 0–32)
AST: 30 IU/L (ref 0–40)
Albumin/Globulin Ratio: 1.6 (ref 1.2–2.2)
Albumin: 4.4 g/dL (ref 3.8–4.9)
Alkaline Phosphatase: 96 IU/L (ref 44–121)
BUN/Creatinine Ratio: 21 (ref 9–23)
BUN: 20 mg/dL (ref 6–24)
Bilirubin Total: 0.5 mg/dL (ref 0.0–1.2)
CO2: 24 mmol/L (ref 20–29)
Calcium: 9.5 mg/dL (ref 8.7–10.2)
Chloride: 102 mmol/L (ref 96–106)
Creatinine, Ser: 0.95 mg/dL (ref 0.57–1.00)
Globulin, Total: 2.8 g/dL (ref 1.5–4.5)
Glucose: 91 mg/dL (ref 70–99)
Potassium: 4.3 mmol/L (ref 3.5–5.2)
Sodium: 141 mmol/L (ref 134–144)
Total Protein: 7.2 g/dL (ref 6.0–8.5)
eGFR: 69 mL/min/{1.73_m2} (ref >59)

## 2021-11-14 LAB — HEMOGLOBIN A1C
Est. average glucose Bld gHb Est-mCnc: 117 mg/dL
Hgb A1c MFr Bld: 5.7 % — ABNORMAL HIGH (ref 4.8–5.6)

## 2021-11-17 ENCOUNTER — Other Ambulatory Visit: Payer: Self-pay | Admitting: Nurse Practitioner

## 2021-11-19 ENCOUNTER — Encounter: Payer: Self-pay | Admitting: Nurse Practitioner

## 2021-11-27 ENCOUNTER — Other Ambulatory Visit: Payer: Self-pay | Admitting: Nurse Practitioner

## 2021-11-27 LAB — COLOGUARD: COLOGUARD: NEGATIVE

## 2021-12-25 ENCOUNTER — Encounter: Payer: Self-pay | Admitting: Allergy

## 2021-12-25 ENCOUNTER — Ambulatory Visit (INDEPENDENT_AMBULATORY_CARE_PROVIDER_SITE_OTHER): Payer: 59 | Admitting: Allergy

## 2021-12-25 VITALS — BP 118/86 | HR 67 | Temp 98.2°F | Ht 69.0 in | Wt >= 6400 oz

## 2021-12-25 DIAGNOSIS — H1013 Acute atopic conjunctivitis, bilateral: Secondary | ICD-10-CM | POA: Diagnosis not present

## 2021-12-25 DIAGNOSIS — J4540 Moderate persistent asthma, uncomplicated: Secondary | ICD-10-CM | POA: Diagnosis not present

## 2021-12-25 DIAGNOSIS — J3089 Other allergic rhinitis: Secondary | ICD-10-CM

## 2021-12-25 MED ORDER — MONTELUKAST SODIUM 10 MG PO TABS: 10.0000 mg | ORAL_TABLET | Freq: Every day | ORAL | 5 refills | Status: DC

## 2021-12-25 MED ORDER — FLUTICASONE PROPIONATE HFA 110 MCG/ACT IN AERO
2.0000 | INHALATION_SPRAY | Freq: Two times a day (BID) | RESPIRATORY_TRACT | 5 refills | Status: DC
Start: 2021-12-25 — End: 2022-05-07

## 2021-12-25 NOTE — Progress Notes (Signed)
New Patient Note  RE: Tomoko Sandra MRN: [id number] DOB: 1961-04-12 Date of Office Visit: 12/25/2021  Primary care provider: Minette Brine, FNP  Chief Complaint: allergies  History of present illness: Laken Rog is a 61 y.o. female presenting today for evaluation of allergic rhinitis.    She states symptoms are year-round with head congestion, nasal congestion, clogged sinuses, headache.  Sometimes will have itchy eyes.  She also reports nasal drainage with throat clearing as well as coughing and wheezing.   She has been using allegra or zyrtec and both do help.  She does use nasacort that does help the nasal symptoms.   She has been prescribed tessalon perles to help with coughing.  She now has an albuterol inhaler to help with cough symptoms and is using it in the morning before work but now needing to use in the evenings before bed due to symptoms.  She states it does help.   She has had kenalog injections for allergy symptoms this spring. It does help for while. She states she was missing work related to worsening allergy symptoms as to the need for Kenalog to get some acute relief.   She states has been more sensitive to smells like household cleaners, plug in fragrances etc. where it feels like it is causing her throat to close up.   She is originally from [place] and after moving here the allergy symptoms were initially more fall but now year-round.     She did see an allergist many years ago and states dust mites were positive that she recalls.    No history of eczema or food allergy.   Review of systems: Review of Systems  Constitutional: Negative.   HENT:         See HPI  Eyes: Negative.   Respiratory:         See HPI   Cardiovascular: Negative.   Gastrointestinal: Negative.   Musculoskeletal: Negative.   Skin: Negative.   Allergic/Immunologic: Negative.   Neurological: Negative.     All other systems negative unless noted above in HPI  Past medical  history: Past Medical History:  Diagnosis Date   Anxiety    Arthritis    Asthma    greater than 20 years ago    Cancer ([place])    hx of skin cancer on back    Complication of anesthesia    Nausea  & vomiting during eye surgery   Depression    DVT (deep venous thrombosis) ([place])    12/2011    Family history of adverse reaction to anesthesia    sister has problems with nausea and vomiting    GERD (gastroesophageal reflux disease)    H/O varicella    Hydradenitis 04/14/11   Hypertension    hx of hypertension no longer on meds    Menopausal symptoms 10/14/10   Menses, irregular 07/02/10   PONV (postoperative nausea and vomiting)    Vulvar itching 04/14/11   Yeast infection     Past surgical history: Past Surgical History:  Procedure Laterality Date   EYE MUSCLE SURGERY     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  2 cats and 1 dog in the home.  There is no concern for water damage, mildew or roaches in the home.  She is an Scientist, water quality.  She denies a smoking history.   Medication List: Current Outpatient Medications  Medication Sig Dispense Refill   acetaminophen (TYLENOL) 500 MG tablet Take 650 mg by mouth every 6 (six) hours as needed for mild pain.      albuterol (VENTOLIN HFA) 108 (90 Base) MCG/ACT inhaler Inhale 2 puffs into the lungs every 6 (six) hours as needed for wheezing or shortness of breath. 18 g 1   ALPRAZolam (XANAX) 1 MG tablet Take 1 mg by mouth daily as needed.      amLODipine (NORVASC) 10 MG tablet Take 1 tablet (10 mg total) by mouth daily. 90 tablet 3   benzonatate (TESSALON PERLES) 100 MG capsule Take 1 capsule (100 mg total) by mouth every 6 (six) hours as needed. 30 capsule 1   buPROPion (WELLBUTRIN XL) 150 MG 24 hr tablet Take 450 mg by mouth every morning. Patient takes 2 tablets daily.     fexofenadine (ALLEGRA) 180 MG tablet Take 180 mg by mouth daily.     methylphenidate 36 MG PO CR tablet Take 36 mg by mouth daily.     Probiotic Product (PROBIOTIC DAILY PO) Take 1 capsule by mouth daily at 12 noon.     vortioxetine HBr (TRINTELLIX) 20 MG TABS tablet Take 20 mg by mouth daily.     VRAYLAR 3 MG capsule Take 3 mg by mouth at bedtime.     Current Facility-Administered Medications  Medication Dose Route Frequency Provider Last Rate Last Admin   albuterol (PROVENTIL HFA;VENTOLIN HFA) 108 (90 Base) MCG/ACT inhaler 2 puff  2 puff Inhalation Once Minette Brine, FNP       triamcinolone acetonide (KENALOG-40) injection 60 mg  60 mg Intramuscular Once Minette Brine, FNP        Known medication allergies: Allergies  Allergen Reactions   Doxycycline Nausea And Vomiting   Dust Mite Extract     UNKNOWN   Mold Extract [Trichophyton Mentagrophyte]     UNKNOWN   Nsaids Other (See Comments)    GI UPSET    Tetracyclines & Related Nausea And Vomiting     Physical examination: Blood pressure 118/86, pulse 67, temperature 98.2 F (36.8 C), temperature source Temporal, height '5\' 9"'$  (1.753 m), weight (!) 416 lb 9.6 oz (189 kg), SpO2 98 %.  General: Alert, interactive, in no acute distress. HEENT: PERRLA, TMs pearly gray, turbinates mildly edematous without discharge, post-pharynx non erythematous. Neck: Supple without lymphadenopathy. Lungs: Clear to auscultation without wheezing, rhonchi or rales. {no increased work of breathing. CV: Normal S1, S2 without murmurs. Abdomen: Nondistended, nontender. Skin: Warm and dry, without lesions or  rashes. Extremities:  No clubbing, cyanosis or edema. Neuro:   Grossly intact.  Diagnositics/Labs:  Spirometry: FEV1: 2.27L 76%, FVC: 2.69L 71% predicted.  Status post albuterol she did not have any improvement in FEV1 or significant response.  Allergy testing:   Airborne Adult Perc - 12/25/21 1600     Time Antigen Placed 1617    Allergen Manufacturer Lavella Hammock    Location Back    Number of Test 59    1. Control-Buffer 50% Glycerol Negative  2. Control-Histamine 1 mg/ml 2+    3. Albumin saline Negative    4. Antonito Negative    5. [place] 2+    6. Johnson Negative    7. [place] Blue 2+    8. Meadow Fescue Negative    9. Perennial Rye Negative    10. Sweet Vernal Negative    11. Timothy Negative    12. Cocklebur Negative    13. Burweed Marshelder Negative    14. Ragweed, short 2+    15. Ragweed, Giant 2+    16. Plantain,  English 2+    17. Lamb's Quarters Negative    18. Sheep Sorrell 2+    19. Rough Pigweed 2+    20. Marsh Elder, Rough Negative    21. Mugwort, Common 2+    22. Ash mix Negative    23. Birch mix Negative    24. Beech American 2+    25. Box, Elder Negative    26. Cedar, red Negative    27. Cottonwood, Eastern 2+    28. Elm mix Negative    29. Hickory Negative    30. Maple mix Negative    31. Oak, [place] mix Negative    32. Pecan Pollen 2+    33. Pine mix Negative    34. Sycamore Eastern 2+    35. [place], Black Pollen Negative    36. Alternaria alternata 2+    37. Cladosporium Herbarum 2+    38. Aspergillus mix Negative    39. Penicillium mix Negative    40. Bipolaris sorokiniana (Helminthosporium) Negative    41. Drechslera spicifera (Curvularia) Negative    42. Mucor plumbeus 2+    43. Fusarium moniliforme 2+    44. Aureobasidium pullulans (pullulara) 2+    45. Rhizopus oryzae Negative    46. Botrytis cinera Negative    47. Epicoccum nigrum Negative    48. Phoma betae Negative    49. Candida Albicans Negative    50. Trichophyton  mentagrophytes Negative    51. Mite, D Farinae  5,000 AU/ml 2+    52. Mite, D Pteronyssinus  5,000 AU/ml Negative    53. Cat Hair 10,000 BAU/ml 2+    54.  Dog Epithelia Negative    55. Mixed Feathers Negative    56. Horse Epithelia Negative    57. Cockroach, German 2+    58. Mouse Negative    59. Tobacco Leaf Negative             Allergy testing results were read and interpreted by provider, documented by clinical staff.   Assessment and plan: Allergic rhinitis with conjunctivitis  - Testing today showed: grasses, ragweed, weeds, trees, indoor molds, outdoor molds, dust mites, cat, and cockroach. - Copy of test results provided.  - Avoidance measures provided. - Continue with: Allegra (fexofenadine) '180mg'$  tablet once daily Nasacort (triamcinolone) two sprays per nostril daily (AIM FOR EAR ON EACH SIDE) for 1-2 weeks at a time before stopping once nasal congestion improves for maximum benefit - Start taking: Singulair (montelukast) '10mg'$  daily at bedtime.  This is an anti-leukotriene that helps with both allergy and breathing symptom control.  If you notice any change in mood/behavior/sleep after starting Singulair then stop this medication and let us know.  Symptoms resolve after stopping the medication.   Pataday (olopatadine) one drop per eye daily as needed for itchy/watery eyes - You can use an extra dose of the antihistamine, if needed, for breakthrough symptoms.  - Consider nasal saline rinses  1-2 times daily to remove allergens from the nasal cavities as well as help with mucous clearance (this is especially helpful to do before the nasal sprays are given) - Consider allergy shots as a means of long-term control. - Allergy shots "re-train" and "reset" the immune system to ignore environmental allergens and decrease the resulting immune response to those allergens (sneezing, itchy watery eyes, runny nose, nasal congestion, etc).    - Allergy shots improve symptoms in 80-85% of  patients.  - We can discuss more at future appointment if the medications are not working for you.  Reactive airway, allergen driven - Daily controller medication(s): Flovent 160mg 2 puffs twice daily with spacer.  This is a maintenance inhaler to help stabilize the airway - Prior to physical activity: albuterol 2 puffs 10-15 minutes before physical activity. - Rescue medications: albuterol 2 puffs every 4-6 hours as needed  - Asthma control goals:  * Full participation in all desired activities (may need albuterol before activity) * Albuterol use two time or less a week on average (not counting use with activity) * Cough interfering with sleep two time or less a month * Oral steroids no more than once a year * No hospitalizations   Follow-up in 3-4 months or sooner if needed  I appreciate the opportunity [facility]. Please do not hesitate to contact me with questions.  Sincerely,   SPrudy Feeler MD Allergy/Immunology [facility] [place] [place]

## 2021-12-25 NOTE — Patient Instructions (Addendum)
-   Testing today showed: grasses, ragweed, weeds, trees, indoor molds, outdoor molds, dust mites, cat, and cockroach. - Copy of test results provided.  - Avoidance measures provided. - Continue with: Allegra (fexofenadine) '180mg'$  tablet once daily Nasacort (triamcinolone) two sprays per nostril daily (AIM FOR EAR ON EACH SIDE) for 1-2 weeks at a time before stopping once nasal congestion improves for maximum benefit - Start taking: Singulair (montelukast) '10mg'$  daily at bedtime.  This is an anti-leukotriene that helps with both allergy and breathing symptom control.  If you notice any change in mood/behavior/sleep after starting Singulair then stop this medication and let us know.  Symptoms resolve after stopping the medication.   Pataday (olopatadine) one drop per eye daily as needed for itchy/watery eyes - You can use an extra dose of the antihistamine, if needed, for breakthrough symptoms.  - Consider nasal saline rinses 1-2 times daily to remove allergens from the nasal cavities as well as help with mucous clearance (this is especially helpful to do before the nasal sprays are given) - Consider allergy shots as a means of long-term control. - Allergy shots "re-train" and "reset" the immune system to ignore environmental allergens and decrease the resulting immune response to those allergens (sneezing, itchy watery eyes, runny nose, nasal congestion, etc).    - Allergy shots improve symptoms in 80-85% of patients.  - We can discuss more at future appointment if the medications are not working for you.  - Daily controller medication(s): Flovent 16mg 2 puffs twice daily with spacer.  This is a maintenance inhaler to help stabilize the airway - Prior to physical activity: albuterol 2 puffs 10-15 minutes before physical activity. - Rescue medications: albuterol 2 puffs every 4-6 hours as needed  - Asthma control goals:  * Full participation in all desired activities (may need albuterol before  activity) * Albuterol use two time or less a week on average (not counting use with activity) * Cough interfering with sleep two time or less a month * Oral steroids no more than once a year * No hospitalizations   Follow-up in 3-4 months or sooner if needed

## 2022-01-20 ENCOUNTER — Encounter: Payer: 59 | Admitting: Nurse Practitioner

## 2022-03-06 ENCOUNTER — Other Ambulatory Visit: Payer: Self-pay | Admitting: Nurse Practitioner

## 2022-03-06 ENCOUNTER — Encounter: Payer: Self-pay | Admitting: Nurse Practitioner

## 2022-03-06 MED ORDER — BENZONATATE 100 MG PO CAPS: 100.0000 mg | ORAL_CAPSULE | Freq: Four times a day (QID) | ORAL | 1 refills | Status: DC | PRN

## 2022-03-18 ENCOUNTER — Other Ambulatory Visit: Payer: Self-pay | Admitting: Cardiology

## 2022-04-15 ENCOUNTER — Other Ambulatory Visit: Payer: Self-pay | Admitting: Nurse Practitioner

## 2022-04-15 DIAGNOSIS — Z1231 Encounter for screening mammogram for malignant neoplasm of breast: Secondary | ICD-10-CM

## 2022-04-17 ENCOUNTER — Ambulatory Visit: Payer: 59 | Admitting: Allergy

## 2022-05-07 ENCOUNTER — Encounter: Payer: Self-pay | Admitting: Allergy

## 2022-05-07 ENCOUNTER — Ambulatory Visit (INDEPENDENT_AMBULATORY_CARE_PROVIDER_SITE_OTHER): Payer: 59 | Admitting: Allergy

## 2022-05-07 VITALS — BP 130/82 | HR 100 | Temp 97.7°F | Resp 20 | Ht 69.0 in | Wt >= 6400 oz

## 2022-05-07 DIAGNOSIS — J4540 Moderate persistent asthma, uncomplicated: Secondary | ICD-10-CM

## 2022-05-07 DIAGNOSIS — H1013 Acute atopic conjunctivitis, bilateral: Secondary | ICD-10-CM

## 2022-05-07 DIAGNOSIS — J3089 Other allergic rhinitis: Secondary | ICD-10-CM | POA: Diagnosis not present

## 2022-05-07 MED ORDER — ALBUTEROL SULFATE HFA 108 (90 BASE) MCG/ACT IN AERS: 2.0000 | INHALATION_SPRAY | Freq: Four times a day (QID) | RESPIRATORY_TRACT | 1 refills | Status: DC | PRN

## 2022-05-07 MED ORDER — FEXOFENADINE HCL 180 MG PO TABS: 180.0000 mg | ORAL_TABLET | Freq: Every day | ORAL | 1 refills | Status: DC | PRN

## 2022-05-07 MED ORDER — FLUTICASONE PROPIONATE HFA 110 MCG/ACT IN AERO: 2.0000 | INHALATION_SPRAY | Freq: Two times a day (BID) | RESPIRATORY_TRACT | 1 refills | Status: DC

## 2022-05-07 MED ORDER — CETIRIZINE HCL 10 MG PO TABS: 10.0000 mg | ORAL_TABLET | Freq: Every day | ORAL | 1 refills | Status: DC | PRN

## 2022-05-07 MED ORDER — SPACER/AERO-HOLDING CHAMBERS DEVI: 1.0000 | 1 refills | Status: AC

## 2022-05-07 MED ORDER — OLOPATADINE HCL 0.2 % OP SOLN: 1.0000 [drp] | OPHTHALMIC | 1 refills | Status: DC

## 2022-05-07 MED ORDER — TRIAMCINOLONE ACETONIDE 55 MCG/ACT NA AERO: 2.0000 | INHALATION_SPRAY | Freq: Every day | NASAL | 1 refills | Status: DC

## 2022-05-07 MED ORDER — MONTELUKAST SODIUM 10 MG PO TABS: 10.0000 mg | ORAL_TABLET | Freq: Every evening | ORAL | 1 refills | Status: DC

## 2022-05-07 NOTE — Progress Notes (Signed)
Follow-up Note  RE: Kendra Haney MRN: [id number] DOB: 1961/01/08 Date of Office Visit: 05/07/2022   History of present illness: Kendra Haney is a 61 y.o. female presenting today for follow-up of allergic rhinitis with conjunctivitis and asthma.She was last seen in the office on 12/25/21 by myself.  She has not had any major health changes, surgeries or hospitalizations since her last visit.  She states the asthma medications have helped.  She also states her allergy symptoms have not been as bad since her mother has been doing much better.  She states she only had to use albuterol couple times around the start the fall with ragweed.  She reports at that time she was having coughing especially with exertion and nighttime awakenings. She does feel like the flovent is helpful but does states sometimes she misses the evening dose.  She states her insurance is not going to cover it in 2024.  She states the maintenance medications need to be 90 day supply with her insurance to cover as well.  She has not had any need for UC/ED visit or systemic steroids. She does report general itchy eyes and will use Pataday as needed. She had been taking Allegra and will be switching back to zyrtec to avoid it becoming ineffective.  She would like to know more about allergy shots as she may be interested at this time.  Review of systems: Review of Systems  Constitutional: Negative.   HENT: Negative.    Eyes: Negative.   Respiratory:  Positive for cough and shortness of breath.   Cardiovascular: Negative.   Gastrointestinal: Negative.   Musculoskeletal: Negative.   Skin: Negative.   Allergic/Immunologic: Negative.   Neurological: Negative.      All other systems negative unless noted above in HPI  Past medical/social/surgical/family history have been reviewed and are unchanged unless specifically indicated below.  No changes  Medication List: Current Outpatient Medications  Medication Sig Dispense  Refill   acetaminophen (TYLENOL) 500 MG tablet Take 650 mg by mouth every 6 (six) hours as needed for mild pain.      albuterol (VENTOLIN HFA) 108 (90 Base) MCG/ACT inhaler Inhale 2 puffs into the lungs every 6 (six) hours as needed for wheezing or shortness of breath. 18 g 1   ALPRAZolam (XANAX) 1 MG tablet Take 1 mg by mouth daily as needed.     amLODipine (NORVASC) 10 MG tablet TAKE 1 TABLET BY MOUTH EVERY DAY 90 tablet 0   benzonatate (TESSALON PERLES) 100 MG capsule Take 1 capsule (100 mg total) by mouth every 6 (six) hours as needed. 30 capsule 1   buPROPion (WELLBUTRIN XL) 150 MG 24 hr tablet Take 450 mg by mouth every morning. Patient takes 2 tablets daily.     busPIRone (BUSPAR) 15 MG tablet Take 1 tablet by mouth as directed.     cetirizine (ZYRTEC) 10 MG tablet Take 1 tablet (10 mg total) by mouth daily as needed for allergies (Can take an extra dose during flare ups.). 180 tablet 1   clonazePAM (KLONOPIN) 1 MG tablet Take 1 mg by mouth daily as needed.     fexofenadine (ALLEGRA) 180 MG tablet Take 1 tablet (180 mg total) by mouth daily as needed for allergies or rhinitis (Can take an extra dose during flare ups.). 180 tablet 1   fluticasone (FLOVENT HFA) 110 MCG/ACT inhaler Inhale 2 puffs into the lungs 2 (two) times daily. 36 g 1   methylphenidate 54 MG PO CR tablet Take  54 mg by mouth every morning.     montelukast (SINGULAIR) 10 MG tablet Take 1 tablet (10 mg total) by mouth at bedtime. 90 tablet 1   Olopatadine HCl (PATADAY) 0.2 % SOLN Place 1 drop into both eyes 1 day or 1 dose. 7.5 mL 1   Probiotic Product (PROBIOTIC DAILY PO) Take 1 capsule by mouth daily at 12 noon.     Spacer/Aero-Holding Chambers DEVI 1 Device by Does not apply route as directed. 1 each 1   triamcinolone (NASACORT) 55 MCG/ACT AERO nasal inhaler Place 2 sprays into the nose daily. 51 g 1   vortioxetine HBr (TRINTELLIX) 20 MG TABS tablet Take 20 mg by mouth daily.     Current Facility-Administered Medications   Medication Dose Route Frequency Provider Last Rate Last Admin   albuterol (PROVENTIL HFA;VENTOLIN HFA) 108 (90 Base) MCG/ACT inhaler 2 puff  2 puff Inhalation Once Minette Brine, FNP       triamcinolone acetonide (KENALOG-40) injection 60 mg  60 mg Intramuscular Once Minette Brine, FNP         Known medication allergies: Allergies  Allergen Reactions   Doxycycline Nausea And Vomiting   Dust Mite Extract     UNKNOWN   Mold Extract [Trichophyton Mentagrophyte]     UNKNOWN   Nsaids Other (See Comments)    GI UPSET    Tetracyclines & Related Nausea And Vomiting     Physical examination: Blood pressure 130/82, pulse 100, temperature 97.7 F (36.5 C), resp. rate 20, height '5\' 9"'$  (1.753 m), weight (!) 422 lb (191.4 kg), SpO2 98 %.  General: Alert, interactive, in no acute distress. HEENT: PERRLA, TMs pearly gray, turbinates minimally edematous without discharge, post-pharynx non erythematous. Neck: Supple without lymphadenopathy. Lungs: Clear to auscultation without wheezing, rhonchi or rales. {no increased work of breathing. CV: Normal S1, S2 without murmurs. Abdomen: Nondistended, nontender. Skin: Warm and dry, without lesions or rashes. Extremities:  No clubbing, cyanosis or edema. Neuro:   Grossly intact.  Diagnositics/Labs: Spirometry: FEV1: 2.46L 80%, FVC: 2.83L 72% predicted.  This is an improved study from her prior in June  Assessment and plan: Allergic rhinitis with conjunctivitis  - Continue avoidance measure for grasses, ragweed, weeds, trees, indoor molds, outdoor molds, dust mites, cat, and cockroach. - Continue Allegra (fexofenadine) '180mg'$  or Zyrtec '10mg'$  tablet once daily.   Alternate every 3-6 months between the two to decrease it become less effective over time Nasacort (triamcinolone) two sprays per nostril daily (AIM FOR EAR ON EACH SIDE) for 1-2 weeks at a time before stopping once nasal congestion improves for maximum benefit - Continue Singulair (montelukast)  '10mg'$  daily at bedtime.   - Continue Pataday (olopatadine) one drop per eye daily as needed for itchy/watery eyes - You can use an extra dose of the antihistamine, if needed, for breakthrough symptoms.  - Consider nasal saline rinses 1-2 times daily to remove allergens from the nasal cavities as well as help with mucous clearance (this is especially helpful to do before the nasal sprays are given) - Allergy shots discussed today including benefits, risks and protocol.   RUSH vs Traditional buildup discussed.  Informational packet provided.  Let us know which buildup option you are interested in.    Moderate persistent asthma - Under good control  - Continue Flovent 151mg 2 puffs twice daily with spacer for now.  Will provide with 90 day supply.  You can send a Mychart of the inhalers your insurance will be covering in 2024.  -  Prior to physical activity: albuterol 2 puffs 10-15 minutes before physical activity. - Rescue medications: albuterol 2 puffs every 4-6 hours as needed  - Asthma control goals:  * Full participation in all desired activities (may need albuterol before activity) * Albuterol use two time or less a week on average (not counting use with activity) * Cough interfering with sleep two time or less a month * Oral steroids no more than once a year * No hospitalizations   Follow-up in March 2024 or sooner if needed  I appreciate the opportunity to take part in [facility] care. Please do not hesitate to contact me with questions.  Sincerely,   Prudy Feeler, MD Allergy/Immunology [facility] [place] [facility] [place]

## 2022-05-07 NOTE — Patient Instructions (Addendum)
-   Continue avoidance measure for grasses, ragweed, weeds, trees, indoor molds, outdoor molds, dust mites, cat, and cockroach. - Continue Allegra (fexofenadine) '180mg'$  or Zyrtec '10mg'$  tablet once daily.   Alternate every 3-6 months between the two to decrease it become less effective over time Nasacort (triamcinolone) two sprays per nostril daily (AIM FOR EAR ON EACH SIDE) for 1-2 weeks at a time before stopping once nasal congestion improves for maximum benefit - Continue Singulair (montelukast) '10mg'$  daily at bedtime.   - Continue Pataday (olopatadine) one drop per eye daily as needed for itchy/watery eyes - You can use an extra dose of the antihistamine, if needed, for breakthrough symptoms.  - Consider nasal saline rinses 1-2 times daily to remove allergens from the nasal cavities as well as help with mucous clearance (this is especially helpful to do before the nasal sprays are given) - Allergy shots discussed today including benefits, risks and protocol.   RUSH vs Traditional buildup discussed.  Informational packet provided.  Let us know which buildup option you are interested in.    - Continue Flovent 163mg 2 puffs twice daily with spacer for now.  Will provide with 90 day supply.  You can send a Mychart of the inhalers your insurance will be covering in 2024.  - Prior to physical activity: albuterol 2 puffs 10-15 minutes before physical activity. - Rescue medications: albuterol 2 puffs every 4-6 hours as needed  - Asthma control goals:  * Full participation in all desired activities (may need albuterol before activity) * Albuterol use two time or less a week on average (not counting use with activity) * Cough interfering with sleep two time or less a month * Oral steroids no more than once a year * No hospitalizations   Follow-up in March 2024 or sooner if needed

## 2022-05-20 ENCOUNTER — Other Ambulatory Visit: Payer: Self-pay | Admitting: Nurse Practitioner

## 2022-05-20 DIAGNOSIS — R112 Nausea with vomiting, unspecified: Secondary | ICD-10-CM

## 2022-05-22 ENCOUNTER — Encounter: Payer: Self-pay | Admitting: Allergy

## 2022-06-14 ENCOUNTER — Other Ambulatory Visit: Payer: Self-pay | Admitting: Cardiology

## 2022-07-10 DIAGNOSIS — Z1231 Encounter for screening mammogram for malignant neoplasm of breast: Secondary | ICD-10-CM

## 2022-07-14 NOTE — Progress Notes (Deleted)
Cardiology Office Note:    Date:  02/26/2021   ID:  Kendra Haney, DOB [birth year], MRN [id number]  PCP:  Minette Brine, FNP  Cardiologist:  None  Electrophysiologist:  None   Referring MD: Minette Brine, FNP   Chief Complaint  Patient presents with   Hypertension     History of Present Illness:    Kendra Haney is a 62 y.o. female with a hx of asthma,  DVT, GERD, hypertension no longer on medications who presents for follow-up.  She was referred by Minette Brine, NP for evaluation of aortic atherosclerosis, initially seen on 09/23/20.  Chest x-ray was done on 07/31/2020 and noted borderline cardiomegaly with aortic atherosclerotic calcifications.  She was referred to cardiology for further evaluation.  Had COVID in December 2021 and had continued coughing so chest x-ray was done with findings as above. Denies any chest pain, dyspnea, lightheadedness, syncope, lower extremity edema, or palpitations.  Reports was previously on antihypertensive from 2009 until 2013 but lost weight and was able to come off medication.  Reports BP has been in 140s recently.  Never smoked.  Mother has CHF (EF 30%) and Afib.    Echocardiogram on 10/18/20 showed normal biventricular function, no significant valvular disease.  Calcium score 0 on 10/18/20.  Reports BP 120/130s/70-80s when checks at home.  Since last clinic visit,  she reports that she has been doing well.  Denies any chest pain, dyspnea, lightheadedness, syncope, lower extremity edema, or palpitations.  Started at [facility] doing [employer].  Denies any exertional symptoms.   Wt Readings from Last 3 Encounters:  02/26/21 (!) 424 lb 3.2 oz (192.4 kg)  02/18/21 (!) 420 lb (190.5 kg)  02/11/21 (!) 419 lb (190.1 kg)      Past Medical History:  Diagnosis Date   Anxiety    Arthritis    Asthma    greater than 20 years ago    Cancer ([place])    hx of skin cancer on back    Complication of anesthesia    Nausea  & vomiting during eye surgery    Depression    DVT (deep venous thrombosis) ([place])    12/2011    Family history of adverse reaction to anesthesia    sister has problems with nausea and vomiting    GERD (gastroesophageal reflux disease)    H/O varicella    Hydradenitis 04/14/11   Hypertension    hx of hypertension no longer on meds    Menopausal symptoms 10/14/10   Menses, irregular 07/02/10   PONV (postoperative nausea and vomiting)    Vulvar itching 04/14/11   Yeast infection     Past Surgical History:  Procedure Laterality Date   EYE MUSCLE SURGERY     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    ALPRAZolam (XANAX) 1 MG tablet Take 1 mg by mouth daily as needed.   buPROPion (WELLBUTRIN XL) 150 MG 24 hr tablet Take 450 mg by mouth every morning. Patient takes 2 tablets daily.   fexofenadine (ALLEGRA) 180 MG tablet Take 180 mg by  mouth daily.   methylphenidate 36 MG PO CR tablet Take 36 mg by mouth daily.   omeprazole (PRILOSEC) 20 MG capsule Take 20 mg by mouth daily.   ondansetron (ZOFRAN) 4 MG tablet Take 1 tablet (4 mg total) by mouth daily as needed for nausea or vomiting.   Probiotic Product (PROBIOTIC DAILY PO) Take 1 capsule by mouth daily at 12 noon.   traZODone (DESYREL) 100 MG tablet Take 100 mg by mouth at bedtime as needed for sleep.    vortioxetine HBr (TRINTELLIX) 20 MG TABS tablet Take 20 mg by mouth daily.   [DISCONTINUED] amLODipine (NORVASC) 5 MG tablet Take 1 tablet (5 mg total) by mouth daily.   Current Facility-Administered Medications for the 02/26/21 encounter (Office Visit) with Donato Heinz, MD   Medication   albuterol (PROVENTIL HFA;VENTOLIN HFA) 108 (90 Base) MCG/ACT inhaler 2 puff   triamcinolone acetonide (KENALOG-40) injection 60 mg     Allergies:   Doxycycline, Dust mite extract, Mold extract [trichophyton mentagrophyte], Nsaids, and Tetracyclines & related   Social History   Socioeconomic History   Marital status: Single    Spouse name: Not on file   Number of children: Not on file   Years of education: Not on file   Highest education level: Not on file  Occupational History   Not on file  Tobacco Use   Smoking status: Never   Smokeless tobacco: Never  Substance and Sexual Activity   Alcohol use: No   Drug use: No   Sexual activity: Never    Birth control/protection: Abstinence, None  Other Topics Concern   Not on file  Social History Narrative   Not on file   Social Determinants of Health   Financial Resource Strain: Not on file  Food Insecurity: Not on file  Transportation Needs: Not on file  Physical Activity: Not on file  Stress: Not on file  Social Connections: Not on file    Family History: The patient's family history includes Cancer in her father; Hypertension in her mother and paternal grandmother. There is no history of Breast cancer.  ROS:   Please see the history of present illness.     All other systems reviewed and are negative.  EKGs/Labs/Other Studies Reviewed:    The following studies were reviewed today:   EKG:  EKG is  ordered today.  The ekg ordered today demonstrates sinus rhythm with PACs, rate 71, no ST abnormalities  Recent Labs: 09/23/2020: ALT 37 10/08/2020: BUN 18; Creatinine, Ser 0.80; Potassium 4.6; Sodium 139 01/27/2021: Hemoglobin 12.9; Platelets 273; TSH 2.330  Recent Lipid Panel    Component Value Date/Time   CHOL 192 09/23/2020 1546   TRIG 72 09/23/2020 1546   HDL 79 09/23/2020 1546   CHOLHDL 2.4 09/23/2020 1546   LDLCALC 100 (H) 09/23/2020 1546    Physical Exam:    VS:  BP (!) 161/88 (BP Location:  Right Arm, Patient Position: Sitting, Cuff Size: Normal) Comment (BP Location): Forearm  Pulse 77   Ht '5\' 10"'$  (1.778 m)   Wt (!) 424 lb 3.2 oz (192.4 kg)   SpO2 99%   BMI 60.87 kg/m     Wt Readings from Last 3 Encounters:  02/26/21 (!) 424 lb 3.2 oz (192.4 kg)  02/18/21 (!) 420 lb (190.5 kg)  02/11/21 (!) 419 lb (190.1 kg)     GEN:in no acute distress HEENT: Normal NECK: No JVD; No carotid bruits LYMPHATICS: No lymphadenopathy CARDIAC: RRR, no murmurs, rubs, gallops RESPIRATORY:  Clear to auscultation without rales, wheezing or rhonchi  ABDOMEN: Soft, non-tender, non-distended MUSCULOSKELETAL:  No edema; No deformity  SKIN: Warm and dry NEUROLOGIC:  Alert and oriented x 3 PSYCHIATRIC:  Normal affect   ASSESSMENT:    1. Morbid obesity ([place])   2. Essential hypertension   3. Cardiomegaly   4. Snoring     PLAN:    Possible cardiomegaly: Noted on chest x-ray.  Echocardiogram on 10/18/20 showed normal biventricular size and function, no significant valvular disease.  Aortic atherosclerosis: Noted on chest x-ray.  LDL 86 on 06/21/2019.  Calcium score 0 on 10/18/20.  Hypertension: On amlodipine 10 mg daily.  Snoring:  Recommend sleep study to evaluate for OSA  Morbid obesity: Body mass index is 60.87 kg/m.  Referred to to healthy weight and wellness,  RTC in 1 year   Medication Adjustments/Labs and Tests Ordered: Current medicines are reviewed at length with the patient today.  Concerns regarding medicines are outlined above.  Orders Placed This Encounter  Procedures   Ambulatory referral to [place] sleep test    Meds ordered this encounter  Medications   amLODipine (NORVASC) 10 MG tablet    Sig: Take 1 tablet (10 mg total) by mouth daily.    Dispense:  90 tablet    Refill:  3    Dose increase     Patient Instructions  Medication Instructions:  INCREASE amlodipine to 10 mg daily  *If you need a refill on your cardiac medications before your  next appointment, please call your pharmacy*  Testing/Procedures: Your physician has recommended that you have a sleep study. This test records several body functions during sleep, including: brain activity, eye movement, oxygen and carbon dioxide blood levels, heart rate and rhythm, breathing rate and rhythm, the flow of air through your mouth and nose, snoring, body muscle movements, and chest and belly movement.  Follow-Up: At [facility], you and your health needs are our priority.  As part of our continuing mission to provide you with exceptional heart care, we have created designated Provider Care Teams.  These Care Teams include your primary Cardiologist (physician) and Advanced Practice Providers (APPs -  Physician Assistants and Nurse Practitioners) who all work together to provide you with the care you need, when you need it.  We recommend signing up for the patient portal called "MyChart".  Sign up information is provided on this After Visit Summary.  MyChart is used to connect with patients for Virtual Visits (Telemedicine).  Patients are able to view lab/test results, encounter notes, upcoming appointments, etc.  Non-urgent messages can be sent to your provider as well.   To learn more about what you can do with MyChart, go to [email or web address].    Your next appointment:   12 month(s)  The format for your next appointment:   In Person  Provider:   Oswaldo Milian, MD   Other Instructions You have been referred to: Healthy Weight and Wellness    Signed, Donato Heinz, MD  02/26/2021 4:50 PM    [place]

## 2022-07-16 ENCOUNTER — Ambulatory Visit: Payer: 59 | Admitting: Cardiology

## 2022-07-31 ENCOUNTER — Other Ambulatory Visit: Payer: Self-pay | Admitting: *Deleted

## 2022-07-31 ENCOUNTER — Encounter: Payer: Self-pay | Admitting: Allergy

## 2022-07-31 MED ORDER — QVAR REDIHALER 80 MCG/ACT IN AERB: 2.0000 | INHALATION_SPRAY | Freq: Two times a day (BID) | RESPIRATORY_TRACT | 5 refills | Status: DC

## 2022-09-04 ENCOUNTER — Ambulatory Visit: Payer: 59 | Admitting: Cardiology

## 2022-09-15 ENCOUNTER — Encounter: Payer: Self-pay | Admitting: Allergy

## 2022-09-17 ENCOUNTER — Encounter: Payer: Self-pay | Admitting: Nurse Practitioner

## 2022-09-17 ENCOUNTER — Ambulatory Visit: Payer: 59 | Admitting: Nurse Practitioner

## 2022-09-17 VITALS — BP 128/88 | HR 61 | Temp 98.3°F | Ht 69.0 in | Wt 399.0 lb

## 2022-09-17 DIAGNOSIS — D509 Iron deficiency anemia, unspecified: Secondary | ICD-10-CM | POA: Insufficient documentation

## 2022-09-17 DIAGNOSIS — E7800 Pure hypercholesterolemia, unspecified: Secondary | ICD-10-CM

## 2022-09-17 DIAGNOSIS — I10 Essential (primary) hypertension: Secondary | ICD-10-CM | POA: Diagnosis not present

## 2022-09-17 DIAGNOSIS — Z79899 Other long term (current) drug therapy: Secondary | ICD-10-CM

## 2022-09-17 DIAGNOSIS — R051 Acute cough: Secondary | ICD-10-CM | POA: Diagnosis not present

## 2022-09-17 DIAGNOSIS — N951 Menopausal and female climacteric states: Secondary | ICD-10-CM | POA: Insufficient documentation

## 2022-09-17 DIAGNOSIS — R350 Frequency of micturition: Secondary | ICD-10-CM | POA: Insufficient documentation

## 2022-09-17 DIAGNOSIS — R0981 Nasal congestion: Secondary | ICD-10-CM

## 2022-09-17 DIAGNOSIS — E559 Vitamin D deficiency, unspecified: Secondary | ICD-10-CM

## 2022-09-17 DIAGNOSIS — R140 Abdominal distension (gaseous): Secondary | ICD-10-CM | POA: Insufficient documentation

## 2022-09-17 MED ORDER — AMOXICILLIN-POT CLAVULANATE 500-125 MG PO TABS: 1.0000 | ORAL_TABLET | Freq: Three times a day (TID) | ORAL | 0 refills | Status: DC

## 2022-09-17 MED ORDER — BENZONATATE 100 MG PO CAPS: 100.0000 mg | ORAL_CAPSULE | Freq: Four times a day (QID) | ORAL | 1 refills | Status: DC | PRN

## 2022-09-17 NOTE — Progress Notes (Signed)
I,Sheena H Holbrook,acting as a Education administrator for Minette Brine, FNP.,have documented all relevant documentation on the behalf of Minette Brine, FNP,as directed by  Minette Brine, FNP while in the presence of Minette Brine, [place].    Subjective:     Patient ID: Kendra Haney , female    DOB: 12/09/1960 , 62 y.o.   MRN: [id number]   Chief Complaint  Patient presents with   Cough    HPI  Patient presents today for complaint of chronic dry cough, nasal congestion and headache.  Patient reports she has missed 2 days of work. Patient reports cough is worse at night when she lays down. Patient also reports occasional dizziness and increased fatigue. Patient denies fever. She has been doing a lot of sleeping. She has expired covid test so she did not test. She lives alone. 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   She is followed by Dr. Charlesetta Garibaldi for her GYN care and is due to see her.   She has not been seen since May 2023  Cough This is a chronic problem. The current episode started 1 to 4 weeks ago. The cough is Non-productive. Associated symptoms include headaches. Pertinent negatives include no chest pain, sore throat or wheezing. She has tried OTC cough suppressant and ipratropium inhaler for the symptoms. Her past medical history is significant for asthma.     Past Medical History:  Diagnosis Date   Anxiety    Arthritis    Asthma    greater than 20 years ago    Cancer ([place])    hx of skin cancer on back    Complication of anesthesia    Nausea  & vomiting during eye surgery   Depression    DVT (deep venous thrombosis) ([employer])    12/2011    Family history of adverse reaction to anesthesia    sister has problems with nausea and vomiting    GERD (gastroesophageal reflux disease)    H/O varicella    Hydradenitis 04/14/11   Hypertension    hx of hypertension no longer on meds    Menopausal symptoms 10/14/10   Menses, irregular 07/02/10   PONV (postoperative nausea  and vomiting)    Vulvar itching 04/14/11   Yeast infection      Family History  Problem Relation Age of Onset   Hypertension Paternal Grandmother    Cancer Father    Hypertension Mother    Breast cancer Neg Hx      Current Outpatient Medications:    acetaminophen (TYLENOL) 500 MG tablet, Take 650 mg by mouth every 6 (six) hours as needed for mild pain. , Disp: , Rfl:    ALPRAZolam (XANAX) 1 MG tablet, Take 1 mg by mouth daily as needed. (Patient not taking: Reported on 10/01/2022), Disp: , Rfl:    amLODipine (NORVASC) 10 MG tablet, TAKE 1 TABLET BY MOUTH EVERY DAY, Disp: 90 tablet, Rfl: 3   amoxicillin-clavulanate (AUGMENTIN) 500-125 MG tablet, Take 1 tablet by mouth 3 (three) times daily. (Patient not taking: Reported on 10/01/2022), Disp: 14 tablet, Rfl: 0   benzonatate (TESSALON PERLES) 100 MG capsule, Take 1 capsule (100 mg total) by mouth every 6 (six) hours as needed., Disp: 30 capsule, Rfl: 1   buPROPion (WELLBUTRIN XL) 150 MG 24 hr tablet, Take 450 mg by mouth every morning. Patient takes 2 tablets daily., Disp: , Rfl:    busPIRone (BUSPAR) 15 MG tablet, Take 1 tablet by mouth as directed.,  Disp: , Rfl:    clonazePAM (KLONOPIN) 1 MG tablet, Take 1 mg by mouth daily as needed., Disp: , Rfl:    fexofenadine (ALLEGRA) 180 MG tablet, Take 1 tablet (180 mg total) by mouth daily as needed for allergies or rhinitis (Can take an extra dose during flare ups.). (Patient not taking: Reported on 10/01/2022), Disp: 180 tablet, Rfl: 1   methylphenidate 54 MG PO CR tablet, Take 54 mg by mouth every morning., Disp: , Rfl:    omeprazole (PRILOSEC) 20 MG capsule, Take 20 mg by mouth daily., Disp: , Rfl:    Probiotic Product (PROBIOTIC DAILY PO), Take 1 capsule by mouth daily at 12 noon., Disp: , Rfl:    Spacer/Aero-Holding Chambers DEVI, 1 Device by Does not apply route as directed., Disp: 1 each, Rfl: 1   vortioxetine HBr (TRINTELLIX) 20 MG TABS tablet, Take 20 mg by mouth daily., Disp: , Rfl:     albuterol (VENTOLIN HFA) 108 (90 Base) MCG/ACT inhaler, Inhale 2 puffs into the lungs every 6 (six) hours as needed for wheezing or shortness of breath., Disp: 54 g, Rfl: 1   cetirizine (ZYRTEC) 10 MG tablet, Take 1 tablet (10 mg total) by mouth daily as needed for allergies (Can take an extra dose during flare ups.)., Disp: 90 tablet, Rfl: 1   montelukast (SINGULAIR) 10 MG tablet, Take 1 tablet (10 mg total) by mouth at bedtime., Disp: 90 tablet, Rfl: 1   Olopatadine HCl (PATADAY) 0.2 % SOLN, Place 1 drop into both eyes 1 day or 1 dose., Disp: 7.5 mL, Rfl: 1   QVAR REDIHALER 80 MCG/ACT inhaler, Inhale 2 puffs into the lungs 2 (two) times daily., Disp: 31.8 g, Rfl: 1   triamcinolone (NASACORT) 55 MCG/ACT AERO nasal inhaler, Place 2 sprays into the nose daily., Disp: 51 g, Rfl: 1  Current Facility-Administered Medications:    albuterol (PROVENTIL HFA;VENTOLIN HFA) 108 (90 Base) MCG/ACT inhaler 2 puff, 2 puff, Inhalation, Once, Minette Brine, FNP   triamcinolone acetonide (KENALOG-40) injection 60 mg, 60 mg, Intramuscular, Once, Minette Brine, FNP   Allergies  Allergen Reactions   Doxycycline Nausea And Vomiting   Dust Mite Extract     UNKNOWN   Mold Extract [Trichophyton Mentagrophyte]     UNKNOWN   Nsaids Other (See Comments)    GI UPSET    Tetracyclines & Related Nausea And Vomiting     Review of Systems  HENT:  Positive for congestion. Negative for sore throat.   Respiratory:  Positive for cough. Negative for wheezing.   Cardiovascular:  Negative for chest pain.  Neurological:  Positive for headaches.  Psychiatric/Behavioral: Negative.    All other systems reviewed and are negative.    Today's Vitals   09/17/22 1553  BP: 128/88  Pulse: 61  Temp: 98.3 F (36.8 C)  TempSrc: Oral  SpO2: 98%  Weight: (!) 399 lb (181 kg)  Height: 5\' 9"  (1.753 m)   Body mass index is 58.92 kg/m.   Objective:  Physical Exam Vitals reviewed.  Constitutional:      General: She is not in  acute distress.    Appearance: Normal appearance.  Cardiovascular:     Rate and Rhythm: Normal rate and regular rhythm.     Pulses: Normal pulses.     Heart sounds: Normal heart sounds. No murmur heard. Pulmonary:     Effort: Pulmonary effort is normal. No respiratory distress.     Breath sounds: Normal breath sounds. No wheezing.  Neurological:  General: No focal deficit present.     Mental Status: She is alert and oriented to person, place, and time.     Cranial Nerves: No cranial nerve deficit.     Motor: No weakness.         Assessment And Plan:     1. Acute cough Comments: Will treat with tessalon perles and encouraged to avoid eating/drinking dairy. - benzonatate (TESSALON PERLES) 100 MG capsule; Take 1 capsule (100 mg total) by mouth every 6 (six) hours as needed.  Dispense: 30 capsule; Refill: 1 - amoxicillin-clavulanate (AUGMENTIN) 500-125 MG tablet; Take 1 tablet by mouth 3 (three) times daily. (Patient not taking: Reported on 10/01/2022)  Dispense: 14 tablet; Refill: 0  2. Sinus congestion Comments: 3 week history of sinus congestion without improvement, will treat with antibiotic. - amoxicillin-clavulanate (AUGMENTIN) 500-125 MG tablet; Take 1 tablet by mouth 3 (three) times daily. (Patient not taking: Reported on 10/01/2022)  Dispense: 14 tablet; Refill: 0  3. Vitamin D deficiency Will check vitamin D level and supplement as needed.    Also encouraged to spend 15 minutes in the sun daily.   4. Essential hypertension Comments: Blood pressure is fairly controlled, continue current medications and encouraged to modify lifestyle modifications. - CMP14+EGFR  5. Elevated cholesterol Comments: Cholesterol levels are stable. Diet controlled. - Lipid panel - CMP14+EGFR  6. Morbid obesity ([place]) She is encouraged to strive for BMI less than 30 to decrease cardiac risk. Advised to aim for at least 150 minutes of exercise per week. - Hemoglobin A1c  7. Other long term  (current) drug therapy - TSH + free T4 - CBC with Diff     Patient was given opportunity to ask questions. Patient verbalized understanding of the plan and was able to repeat key elements of the plan. All questions were answered to their satisfaction.  Minette Brine, FNP   I, Minette Brine, FNP, have reviewed all documentation for this visit. The documentation on 09/17/22 for the exam, diagnosis, procedures, and orders are all accurate and complete.   IF YOU HAVE BEEN REFERRED TO A SPECIALIST, IT MAY TAKE 1-2 WEEKS TO SCHEDULE/PROCESS THE REFERRAL. IF YOU HAVE NOT HEARD FROM US/SPECIALIST IN TWO WEEKS, PLEASE GIVE [place] A CALL AT [phone].   THE PATIENT IS ENCOURAGED TO PRACTICE SOCIAL DISTANCING DUE TO THE COVID-19 PANDEMIC.

## 2022-09-18 LAB — HEMOGLOBIN A1C
Est. average glucose Bld gHb Est-mCnc: 123 mg/dL
Hgb A1c MFr Bld: 5.9 % — ABNORMAL HIGH (ref 4.8–5.6)

## 2022-09-18 LAB — CBC WITH DIFFERENTIAL/PLATELET
Basophils Absolute: 0 10*3/uL (ref 0.0–0.2)
Basos: 1 %
EOS (ABSOLUTE): 0.1 10*3/uL (ref 0.0–0.4)
Eos: 1 %
Hematocrit: 40.5 % (ref 34.0–46.6)
Hemoglobin: 12.9 g/dL (ref 11.1–15.9)
Immature Grans (Abs): 0 10*3/uL (ref 0.0–0.1)
Immature Granulocytes: 0 %
Lymphocytes Absolute: 1.5 10*3/uL (ref 0.7–3.1)
Lymphs: 24 %
MCH: 26.4 pg — ABNORMAL LOW (ref 26.6–33.0)
MCHC: 31.9 g/dL (ref 31.5–35.7)
MCV: 83 fL (ref 79–97)
Monocytes Absolute: 0.4 10*3/uL (ref 0.1–0.9)
Monocytes: 7 %
Neutrophils Absolute: 4.2 10*3/uL (ref 1.4–7.0)
Neutrophils: 67 %
Platelets: 297 10*3/uL (ref 150–450)
RBC: 4.88 x10E6/uL (ref 3.77–5.28)
RDW: 13.3 % (ref 11.7–15.4)
WBC: 6.1 10*3/uL (ref 3.4–10.8)

## 2022-09-18 LAB — CMP14+EGFR
ALT: 31 IU/L (ref 0–32)
AST: 30 IU/L (ref 0–40)
Albumin/Globulin Ratio: 1.9 (ref 1.2–2.2)
Albumin: 4.5 g/dL (ref 3.8–4.9)
Alkaline Phosphatase: 96 IU/L (ref 44–121)
BUN/Creatinine Ratio: 14 (ref 9–23)
BUN: 13 mg/dL (ref 6–24)
Bilirubin Total: 0.6 mg/dL (ref 0.0–1.2)
CO2: 24 mmol/L (ref 20–29)
Calcium: 9.6 mg/dL (ref 8.7–10.2)
Chloride: 99 mmol/L (ref 96–106)
Creatinine, Ser: 0.95 mg/dL (ref 0.57–1.00)
Globulin, Total: 2.4 g/dL (ref 1.5–4.5)
Glucose: 97 mg/dL (ref 70–99)
Potassium: 4.8 mmol/L (ref 3.5–5.2)
Sodium: 138 mmol/L (ref 134–144)
Total Protein: 6.9 g/dL (ref 6.0–8.5)
eGFR: 69 mL/min/{1.73_m2} (ref >59)

## 2022-09-18 LAB — TSH+FREE T4
Free T4: 1.21 ng/dL (ref 0.82–1.77)
TSH: 1.4 u[IU]/mL (ref 0.450–4.500)

## 2022-09-18 LAB — LIPID PANEL
Chol/HDL Ratio: 2.3 ratio (ref 0.0–4.4)
Cholesterol, Total: 197 mg/dL (ref 100–199)
HDL: 84 mg/dL (ref >39)
LDL Chol Calc (NIH): 99 mg/dL (ref 0–99)
Triglycerides: 78 mg/dL (ref 0–149)
VLDL Cholesterol Cal: 14 mg/dL (ref 5–40)

## 2022-09-21 ENCOUNTER — Encounter: Payer: Self-pay | Admitting: Allergy

## 2022-10-01 ENCOUNTER — Other Ambulatory Visit: Payer: Self-pay

## 2022-10-01 ENCOUNTER — Encounter: Payer: Self-pay | Admitting: Allergy

## 2022-10-01 ENCOUNTER — Ambulatory Visit (INDEPENDENT_AMBULATORY_CARE_PROVIDER_SITE_OTHER): Payer: 59 | Admitting: Allergy

## 2022-10-01 VITALS — BP 130/90 | HR 64 | Temp 98.3°F | Resp 18

## 2022-10-01 DIAGNOSIS — J3089 Other allergic rhinitis: Secondary | ICD-10-CM | POA: Diagnosis not present

## 2022-10-01 DIAGNOSIS — H1013 Acute atopic conjunctivitis, bilateral: Secondary | ICD-10-CM

## 2022-10-01 DIAGNOSIS — J4540 Moderate persistent asthma, uncomplicated: Secondary | ICD-10-CM

## 2022-10-01 MED ORDER — QVAR REDIHALER 80 MCG/ACT IN AERB: 2.0000 | INHALATION_SPRAY | Freq: Two times a day (BID) | RESPIRATORY_TRACT | 1 refills | Status: DC

## 2022-10-01 MED ORDER — ALBUTEROL SULFATE HFA 108 (90 BASE) MCG/ACT IN AERS: 2.0000 | INHALATION_SPRAY | Freq: Four times a day (QID) | RESPIRATORY_TRACT | 1 refills | Status: DC | PRN

## 2022-10-01 MED ORDER — OLOPATADINE HCL 0.2 % OP SOLN: 1.0000 [drp] | OPHTHALMIC | 1 refills | Status: DC

## 2022-10-01 MED ORDER — CETIRIZINE HCL 10 MG PO TABS: 10.0000 mg | ORAL_TABLET | Freq: Every day | ORAL | 1 refills | Status: AC | PRN

## 2022-10-01 MED ORDER — MONTELUKAST SODIUM 10 MG PO TABS: 10.0000 mg | ORAL_TABLET | Freq: Every evening | ORAL | 1 refills | Status: DC

## 2022-10-01 MED ORDER — TRIAMCINOLONE ACETONIDE 55 MCG/ACT NA AERO: 2.0000 | INHALATION_SPRAY | Freq: Every day | NASAL | 1 refills | Status: AC

## 2022-10-01 NOTE — Progress Notes (Signed)
Follow-up Note  RE: Kendra Haney MRN: [id number] DOB: 1961-06-07 Date of Office Visit: 10/01/2022   History of present illness: Kendra Haney is a 62 y.o. female presenting today for follow-up of allergic rhinitis with conjunctivitis and asthma.  She was last seen in the office on 05/07/2022 by myself. Several weeks ago she started having a dry hacking cough as well as postnasal drainage.  She states the cough is better at this time.  When she was having more symptoms she was reporting nighttime awakenings related to this cough.  She also reports ports feeling quite fatigued and she missed some work days due to the symptoms.  Denies fever.  She did see her primary care for the symptoms and was prescribed Tessalon Perles which she states helped some.  She has used [employer] before and they tend to work a bit better than they did at this time.  She also was prescribed amoxicillin she states she did not pick this up from the pharmacy.  At this time she is still having some cough in the nasal drainage and a little bit of sore throat.  She is currently using antihistamine and will alternate between Allegra and Zyrtec she does use Nasacort for her nasal symptoms.  She also takes Singulair daily.  She is using her Qvar 2 puffs twice a day at this time due to the cough symptoms traditionally she went to the Qvar daily.  She does note that she prefers this Qvar device over the previous Flovent device.  We had to change from Flovent due to insurance coverage.  She reports she did need to use her albuterol a bit more with the cough over the last several weeks. She did look into allergy shots with her insurance states that she would have to of about $60 when she would come for her injections however she does state that she likely can make this work she does not want to pursue immunotherapy.  Review of systems: Review of Systems  Constitutional:        See HPI  HENT:         See HPI  Eyes: Negative.    Respiratory:         See HPI  Cardiovascular: Negative.   Gastrointestinal: Negative.   Musculoskeletal: Negative.   Skin: Negative.   Allergic/Immunologic: Negative.   Neurological: Negative.      All other systems negative unless noted above in HPI  Past medical/social/surgical/family history have been reviewed and are unchanged unless specifically indicated below.  No changes  Medication List: Current Outpatient Medications  Medication Sig Dispense Refill   acetaminophen (TYLENOL) 500 MG tablet Take 650 mg by mouth every 6 (six) hours as needed for mild pain.      amLODipine (NORVASC) 10 MG tablet TAKE 1 TABLET BY MOUTH EVERY DAY 90 tablet 3   benzonatate (TESSALON PERLES) 100 MG capsule Take 1 capsule (100 mg total) by mouth every 6 (six) hours as needed. 30 capsule 1   buPROPion (WELLBUTRIN XL) 150 MG 24 hr tablet Take 450 mg by mouth every morning. Patient takes 2 tablets daily.     busPIRone (BUSPAR) 15 MG tablet Take 1 tablet by mouth as directed.     clonazePAM (KLONOPIN) 1 MG tablet Take 1 mg by mouth daily as needed.     methylphenidate 54 MG PO CR tablet Take 54 mg by mouth every morning.     omeprazole (PRILOSEC) 20 MG capsule Take 20 mg by  mouth daily.     Probiotic Product (PROBIOTIC DAILY PO) Take 1 capsule by mouth daily at 12 noon.     Spacer/Aero-Holding Chambers DEVI 1 Device by Does not apply route as directed. 1 each 1   vortioxetine HBr (TRINTELLIX) 20 MG TABS tablet Take 20 mg by mouth daily.     albuterol (VENTOLIN HFA) 108 (90 Base) MCG/ACT inhaler Inhale 2 puffs into the lungs every 6 (six) hours as needed for wheezing or shortness of breath. 54 g 1   ALPRAZolam (XANAX) 1 MG tablet Take 1 mg by mouth daily as needed. (Patient not taking: Reported on 10/01/2022)     amoxicillin-clavulanate (AUGMENTIN) 500-125 MG tablet Take 1 tablet by mouth 3 (three) times daily. (Patient not taking: Reported on 10/01/2022) 14 tablet 0   cetirizine (ZYRTEC) 10 MG tablet  Take 1 tablet (10 mg total) by mouth daily as needed for allergies (Can take an extra dose during flare ups.). 90 tablet 1   fexofenadine (ALLEGRA) 180 MG tablet Take 1 tablet (180 mg total) by mouth daily as needed for allergies or rhinitis (Can take an extra dose during flare ups.). (Patient not taking: Reported on 10/01/2022) 180 tablet 1   montelukast (SINGULAIR) 10 MG tablet Take 1 tablet (10 mg total) by mouth at bedtime. 90 tablet 1   Olopatadine HCl (PATADAY) 0.2 % SOLN Place 1 drop into both eyes 1 day or 1 dose. 7.5 mL 1   QVAR REDIHALER 80 MCG/ACT inhaler Inhale 2 puffs into the lungs 2 (two) times daily. 31.8 g 1   triamcinolone (NASACORT) 55 MCG/ACT AERO nasal inhaler Place 2 sprays into the nose daily. 51 g 1   Current Facility-Administered Medications  Medication Dose Route Frequency Provider Last Rate Last Admin   albuterol (PROVENTIL HFA;VENTOLIN HFA) 108 (90 Base) MCG/ACT inhaler 2 puff  2 puff Inhalation Once Minette Brine, FNP       triamcinolone acetonide (KENALOG-40) injection 60 mg  60 mg Intramuscular Once Minette Brine, FNP         Known medication allergies: Allergies  Allergen Reactions   Doxycycline Nausea And Vomiting   Dust Mite Extract     UNKNOWN   Mold Extract [Trichophyton Mentagrophyte]     UNKNOWN   Nsaids Other (See Comments)    GI UPSET    Tetracyclines & Related Nausea And Vomiting     Physical examination: Blood pressure (!) 130/90, pulse 64, temperature 98.3 F (36.8 C), temperature source Temporal, resp. rate 18, SpO2 96 %.  General: Alert, interactive, in no acute distress. HEENT: PERRLA, TMs pearly gray, turbinates mildly edematous without discharge, post-pharynx non erythematous. Neck: Supple without lymphadenopathy. Lungs: Clear to auscultation without wheezing, rhonchi or rales. {no increased work of breathing. CV: Normal S1, S2 without murmurs. Abdomen: Nondistended, nontender. Skin: Warm and dry, without lesions or  rashes. Extremities:  No clubbing, cyanosis or edema. Neuro:   Grossly intact.  Diagnositics/Labs: None today  Assessment and plan: Allergic rhinitis with conjunctivitis  - Continue avoidance measure for grasses, ragweed, weeds, trees, indoor molds, outdoor molds, dust mites, cat, and cockroach. - Continue Allegra (fexofenadine) 180mg  or Zyrtec 10mg  tablet once daily.   Alternate every 3-6 months between the two to decrease it become less effective over time - Use Astepro 2 sprays each nostril 1-2 times a day for nasal drainage/post-nasal drip.  Use this to help decrease cough symptoms related to sinus drainage.  Nasacort (triamcinolone) two sprays per nostril daily (AIM FOR EAR ON EACH SIDE)  for 1-2 weeks at a time before stopping once nasal congestion improves for maximum benefit - Continue Singulair (montelukast) 10mg  daily at bedtime.   - Continue Pataday (olopatadine) one drop per eye daily as needed for itchy/watery eyes - You can use an extra dose of the antihistamine, if needed, for breakthrough symptoms.  - Consider nasal saline rinses 1-2 times daily to remove allergens from the nasal cavities as well as help with mucous clearance (this is especially helpful to do before the nasal sprays are given) - Allergy shots has been discussed and you have looked into insurance coverage.  When able to proceed you can schedule a new start appointment.    Mod persistent asthma - Continue Qvar 2 puffs twice a day.   - Prior to physical activity: albuterol 2 puffs 10-15 minutes before physical activity. - Rescue medications: albuterol 2 puffs every 4-6 hours as needed  - Asthma control goals:  * Full participation in all desired activities (may need albuterol before activity) * Albuterol use two time or less a week on average (not counting use with activity) * Cough interfering with sleep two time or less a month * Oral steroids no more than once a year * No hospitalizations   Follow-up in  6 months or sooner if needed  I appreciate the opportunity to take part in [facility] care. Please do not hesitate to contact me with questions.  Sincerely,   Kendra Feeler, MD Allergy/Immunology [facility]

## 2022-10-01 NOTE — Patient Instructions (Addendum)
-   Continue avoidance measure for grasses, ragweed, weeds, trees, indoor molds, outdoor molds, dust mites, cat, and cockroach. - Continue Allegra (fexofenadine) 180mg  or Zyrtec 10mg  tablet once daily.   Alternate every 3-6 months between the two to decrease it become less effective over time - Use Astepro 2 sprays each nostril 1-2 times a day for nasal drainage/post-nasal drip.  Use this to help decrease cough symptoms related to sinus drainage.  Nasacort (triamcinolone) two sprays per nostril daily (AIM FOR EAR ON EACH SIDE) for 1-2 weeks at a time before stopping once nasal congestion improves for maximum benefit - Continue Singulair (montelukast) 10mg  daily at bedtime.   - Continue Pataday (olopatadine) one drop per eye daily as needed for itchy/watery eyes - You can use an extra dose of the antihistamine, if needed, for breakthrough symptoms.  - Consider nasal saline rinses 1-2 times daily to remove allergens from the nasal cavities as well as help with mucous clearance (this is especially helpful to do before the nasal sprays are given) - Allergy shots has been discussed and you have looked into insurance coverage.  When able to proceed you can schedule a new start appointment.    - Continue Qvar 2 puffs twice a day.   - Prior to physical activity: albuterol 2 puffs 10-15 minutes before physical activity. - Rescue medications: albuterol 2 puffs every 4-6 hours as needed  - Asthma control goals:  * Full participation in all desired activities (may need albuterol before activity) * Albuterol use two time or less a week on average (not counting use with activity) * Cough interfering with sleep two time or less a month * Oral steroids no more than once a year * No hospitalizations   Follow-up in 6 months or sooner if needed

## 2022-10-12 DIAGNOSIS — M19012 Primary osteoarthritis, left shoulder: Secondary | ICD-10-CM

## 2022-10-12 HISTORY — DX: Primary osteoarthritis, left shoulder: M19.012

## 2022-10-16 ENCOUNTER — Other Ambulatory Visit: Payer: Self-pay | Admitting: *Deleted

## 2022-10-16 ENCOUNTER — Encounter: Payer: Self-pay | Admitting: Allergy

## 2022-10-16 MED ORDER — QVAR REDIHALER 80 MCG/ACT IN AERB: 2.0000 | INHALATION_SPRAY | Freq: Two times a day (BID) | RESPIRATORY_TRACT | 1 refills | Status: DC

## 2022-10-26 ENCOUNTER — Ambulatory Visit: Payer: 59 | Admitting: Cardiology

## 2022-12-29 ENCOUNTER — Encounter: Payer: 59 | Admitting: Nurse Practitioner

## 2023-01-21 ENCOUNTER — Encounter: Payer: Self-pay | Admitting: Dermatology

## 2023-01-21 ENCOUNTER — Ambulatory Visit: Payer: 59 | Admitting: Dermatology

## 2023-01-21 VITALS — BP 154/97

## 2023-01-21 DIAGNOSIS — L308 Other specified dermatitis: Secondary | ICD-10-CM

## 2023-01-21 DIAGNOSIS — D492 Neoplasm of unspecified behavior of bone, soft tissue, and skin: Secondary | ICD-10-CM

## 2023-01-21 NOTE — Progress Notes (Signed)
   New Patient Visit   Subjective  Kendra Haney is a 62 y.o. female who presents for the following: A growth on the right post lower leg x 7 months. It is not itchy or painful. It was more raised in May 2024.She has not had a prescription for it. She applies Cerave lotion daily. Prior Derm was [facility]. Pt does not recall her last skin exam.   The following portions of the chart were reviewed this encounter and updated as appropriate: medications, allergies, medical history  Review of Systems:  No other skin or systemic complaints except as noted in HPI or Assessment and Plan.  Objective  Well appearing patient in no apparent distress; mood and affect are within normal limits.    A focused examination was performed of the following areas: Right lower leg  Photo on patient phone taken in May   Current photo   Relevant exam findings are noted in the Assessment and Plan.  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 - Plan:   a. Perform a shave biopsy of the 10mm violaceous  lichenified papule for histopathological examination to confirm the diagnosis.   b. Instruct patient to keep the biopsy site covered with Vaseline and a Band-Aid to promote healing.   c. Communicate biopsy results to the patient via MyChart or phone call, depending on the findings and need for follow-up treatment.   Procedure Note Neoplasm of skin Right Lower Leg - Posterior  Skin / nail biopsy Type of biopsy: tangential   Informed consent: discussed and consent obtained   Timeout: patient name, date of birth, surgical site, and procedure verified   Procedure prep:  Patient was prepped and draped in usual sterile fashion Prep type:  Isopropyl alcohol Anesthesia: the lesion was anesthetized in a standard  fashion   Anesthetic:  1% lidocaine w/ epinephrine 1-100,000 buffered w/ 8.4% NaHCO3 Instrument used: [employer]ermaBlade   Hemostasis achieved with: aluminum chloride   Outcome: patient tolerated procedure well   Post-procedure details: sterile dressing applied and wound care instructions given   Dressing type: petrolatum gauze and bandage      No follow-ups on file.  Jaclynn Guarneri, CMA, am acting as scribe for [employer], DO.   Documentation: I have reviewed the above documentation for accuracy and completeness, and I agree with the above.  Langston Reusing, DO

## 2023-01-21 NOTE — Patient Instructions (Addendum)
Thank you for visiting my office and discussing your skin concerns. I appreciate your commitment to improving your health and taking the necessary steps to address the issues you've been experiencing.  Here is a summary of the key instructions and next steps from our appointment:  - Biopsy Procedure: We performed a shave biopsy on the larger, more representative bump to analyze the underlying changes. This involved numbing the area with lidocaine and then shaving off the elevated spot.  - Post-Biopsy Care: It is crucial to keep the biopsy area covered with Vaseline and a Band-Aid at all times to promote healing, similar to caring for a brush burn.  - Follow-Up: We will contact you with the biopsy results to determine if any further treatment is necessary. Please keep an eye on your MyChart for faster updates.  - Differential Diagnosis: We are considering several possibilities for your diagnosis, including, Kaposi's sarcoma, granuloma annulare, and bite granuloma. The final diagnosis will depend on the biopsy results.  Please feel free to reach out if you have any questions or concerns while you await your results. We are here to support you every step of the way.          Patient Handout: Wound Care for Skin Biopsy Site  Patient Handout: Wound Care for Skin Biopsy Site  Taking Care of Your Skin Biopsy Site  Proper care of the biopsy site is essential for promoting healing and minimizing scarring. This handout provides instructions on how to care for your biopsy site to ensure optimal recovery.  1. Cleaning the Wound:  Clean the biopsy site daily with gentle soap and water. Gently pat the area dry with a clean, soft towel. Avoid harsh scrubbing or rubbing the area, as this can irritate the skin and delay healing.  2. Applying Aquaphor and Bandage:  After cleaning the wound, apply a thin layer of Aquaphor ointment to the biopsy site. Cover the area with a sterile bandage to  protect it from dirt, bacteria, and friction. Change the bandage daily or as needed if it becomes soiled or wet.  3. Continued Care for One Week:  Repeat the cleaning, Aquaphor application, and bandaging process daily for one week following the biopsy procedure. Keeping the wound clean and moist during this initial healing period will help prevent infection and promote optimal healing.  4. Massaging Aquaphor into the Area:  ---After one week, discontinue the use of bandages but continue to apply Aquaphor to the biopsy site. ----Gently massage the Aquaphor into the area using circular motions. ---Massaging the skin helps to promote circulation and prevent the formation of scar tissue.   Additional Tips:  Avoid exposing the biopsy site to direct sunlight during the healing process, as this can cause hyperpigmentation or worsen scarring. If you experience any signs of infection, such as increased redness, swelling, warmth, or drainage from the wound, contact your healthcare provider immediately. Follow any additional instructions provided by your healthcare provider for caring for the biopsy site and managing any discomfort. Conclusion:  Taking proper care of your skin biopsy site is crucial for ensuring optimal healing and minimizing scarring. By following these instructions for cleaning, applying Aquaphor, and massaging the area, you can promote a smooth and successful recovery. If you have any questions or concerns about caring for your biopsy site, don't hesitate to contact your healthcare provider for guidance.     Due to recent changes in healthcare laws, you may see results of your pathology and/or laboratory studies on MyChart  before the doctors have had a chance to review them. We understand that in some cases there may be results that are confusing or concerning to you. Please understand that not all results are received at the same time and often the doctors may need to interpret  multiple results in order to provide you with the best plan of care or course of treatment. Therefore, we ask that you please give [place] 2 business days to thoroughly review all your results before contacting the office for clarification. Should we see a critical lab result, you will be contacted sooner.   If You Need Anything After Your Visit  If you have any questions or concerns for your doctor, please call our main line at [phone] If no one answers, please leave a voicemail as directed and we will return your call as soon as possible. Messages left after 4 pm will be answered the following business day.   You may also send [place] a message via MyChart. We typically respond to MyChart messages within 1-2 business days.  For prescription refills, please ask your pharmacy to contact our office. Our fax number is [phone].  If you have an urgent issue when the clinic is closed that cannot wait until the next business day, you can page your doctor at the number below.    Please note that while we do our best to be available for urgent issues outside of office hours, we are not available 24/7.   If you have an urgent issue and are unable to reach [place], you may choose to seek medical care at your doctor's office, retail clinic, urgent care center, or emergency room.  If you have a medical emergency, please immediately call 911 or go to the emergency department. In the event of inclement weather, please call our main line at [phone] for an update on the status of any delays or closures.  Dermatology Medication Tips: Please keep the boxes that topical medications come in in order to help keep track of the instructions about where and how to use these. Pharmacies typically print the medication instructions only on the boxes and not directly on the medication tubes.   If your medication is too expensive, please contact our office at [phone] or send [place] a message through MyChart.   We are  unable to tell what your co-pay for medications will be in advance as this is different depending on your insurance coverage. However, we may be able to find a substitute medication at lower cost or fill out paperwork to get insurance to cover a needed medication.   If a prior authorization is required to get your medication covered by your insurance company, please allow [place] 1-2 business days to complete this process.  Drug prices often vary depending on where the prescription is filled and some pharmacies may offer cheaper prices.  The website www.goodrx.com contains coupons for medications through different pharmacies. The prices here do not account for what the cost may be with help from insurance (it may be cheaper with your insurance), but the website can give you the price if you did not use any insurance.  - You can print the associated coupon and take it with your prescription to the pharmacy.  - You may also stop by our office during regular business hours and pick up a GoodRx coupon card.  - If you need your prescription sent electronically to a different pharmacy, notify our office through [facility] or by phone at  [phone]     

## 2023-01-27 ENCOUNTER — Encounter: Payer: Self-pay | Admitting: Dermatology

## 2023-01-27 NOTE — Progress Notes (Signed)
Hi Kendra Haney,  Please call patient and let her know that the biopsy results confirmed that the lesion sampled was a collection of blood under the skin, likely from minor trauma or result from a bug bite that became inflamed . Testing for bacteria or fungus was negative.  No other abnormalities were noted.

## 2023-02-02 ENCOUNTER — Other Ambulatory Visit (HOSPITAL_BASED_OUTPATIENT_CLINIC_OR_DEPARTMENT_OTHER): Payer: Self-pay

## 2023-02-02 MED ORDER — ZEPBOUND 2.5 MG/0.5ML ~~LOC~~ SOAJ
2.5000 mg | SUBCUTANEOUS | 0 refills | Status: DC
  Filled 2023-02-02: qty 2, 28d supply, fill #0

## 2023-02-10 ENCOUNTER — Other Ambulatory Visit (HOSPITAL_BASED_OUTPATIENT_CLINIC_OR_DEPARTMENT_OTHER): Payer: Self-pay

## 2023-02-10 MED ORDER — ZEPBOUND 5 MG/0.5ML ~~LOC~~ SOAJ
5.0000 mg | SUBCUTANEOUS | 0 refills | Status: DC
  Filled 2023-02-10: qty 2, 28d supply, fill #0

## 2023-02-11 ENCOUNTER — Other Ambulatory Visit (HOSPITAL_BASED_OUTPATIENT_CLINIC_OR_DEPARTMENT_OTHER): Payer: Self-pay

## 2023-03-18 ENCOUNTER — Other Ambulatory Visit (HOSPITAL_BASED_OUTPATIENT_CLINIC_OR_DEPARTMENT_OTHER): Payer: Self-pay

## 2023-03-18 MED ORDER — ZEPBOUND 5 MG/0.5ML ~~LOC~~ SOAJ
5.0000 mg | SUBCUTANEOUS | 0 refills | Status: DC
  Filled 2023-03-18: qty 2, 28d supply, fill #0

## 2023-03-29 ENCOUNTER — Other Ambulatory Visit (HOSPITAL_BASED_OUTPATIENT_CLINIC_OR_DEPARTMENT_OTHER): Payer: Self-pay

## 2023-03-29 MED ORDER — ONDANSETRON HCL 4 MG PO TABS
4.0000 mg | ORAL_TABLET | Freq: Four times a day (QID) | ORAL | 0 refills | Status: AC | PRN
Start: 2023-03-29 — End: ?
  Filled 2023-03-29: qty 9, 28d supply, fill #0

## 2023-04-08 ENCOUNTER — Ambulatory Visit: Payer: 59 | Admitting: Allergy

## 2023-04-08 ENCOUNTER — Other Ambulatory Visit (HOSPITAL_BASED_OUTPATIENT_CLINIC_OR_DEPARTMENT_OTHER): Payer: Self-pay

## 2023-04-08 MED ORDER — ZEPBOUND 5 MG/0.5ML ~~LOC~~ SOAJ
5.0000 mg | SUBCUTANEOUS | 0 refills | Status: DC
  Filled 2023-04-08: qty 2, 28d supply, fill #0

## 2023-04-08 MED ORDER — ONDANSETRON HCL 4 MG PO TABS
4.0000 mg | ORAL_TABLET | Freq: Four times a day (QID) | ORAL | 0 refills | Status: AC | PRN
  Filled 2023-04-08: qty 9, 3d supply, fill #0

## 2023-04-14 NOTE — Progress Notes (Signed)
Cardiology Clinic Note   Date: 04/16/2023 ID: Kendra Haney, DOB 24-Jun-1961, MRN [id number]  Primary Cardiologist:  Little Ishikawa, MD  Patient Profile    Kendra Haney is a 62 y.o. female who presents to the clinic today for evaluation of PACs.    Past medical history significant for: Cardiomegaly. CT cardiac scoring 10/18/2020: Calcium score of 0. Echo 10/18/2020: EF 55 to 60%.  No RWMA.  Mild LVH.  Indeterminate diastolic parameters.  Normal RV function.  Mild LAE.  No significant valvular abnormalities.  Borderline dilatation of aortic root 38 mm. Hypertension. GERD. Asthma. DVT.     History of Present Illness    Kendra Haney was first evaluated by Dr. Bjorn Pippin on 09/23/2020 for cardiomegaly and aortic atherosclerotic calcifications found on chest x-ray at the request of Arnette Felts, FNP.  There is a chest x-ray performed on 07/31/2020 secondary to persistent cough after having COVID in December 2021.  Patient had previously been on an antihypertensive medication from 2009-2013 but was able to discontinue medication when she lost weight.  Patient's blood pressure was elevated at the time of her visit.  She reported noticing SBP in the 140s.  She was started on losartan.  She underwent CT cardiac scoring and echo for further evaluation of cardiomegaly.  Echo showed normal LV/RV function with mild LVH, mild LAE, borderline dilatation of aortic root 38 mm, and no significant valvular abnormalities.  Coronary calcium score was 0.  Last seen in the office by Dr. Bjorn Pippin on 02/26/2021 for routine follow-up.  She was doing well at that time.  Her BP was elevated and amlodipine was increased to 10 mg daily.  Discussed the use of AI scribe software for clinical note transcription with the patient, who gave verbal consent to proceed.  The patient presents with concerns about recently discovered premature atrial complexes (PACs). She was at weight management when she was told she was  having "PVCs." She has no awareness/palpitations. One night she laid on her bed and felt her carotid pulse and noticed "constant" extra beats. The patient reports no symptoms associated with the PACs. She stopped Concerta until she was evaluated.  She is concerned, as her mom had afib. Denies chest pain, shortness of breath, activity intolerance, palpitations or edema. The patient has been on a weight loss program since mid-July and has lost 39 pounds. The patient is also scheduled for a sleep study due to suspected sleep apnea. The patient reports snoring but no other symptoms of sleep apnea. The patient is currently on medication for hypertension and ADD, and reports no side effects. She recently joined [employer] [facility]. She has not yet started going.        ROS: All other systems reviewed and are otherwise negative except as noted in History of Present Illness.  Studies Reviewed    EKG Interpretation Date/Time:  Friday April 16 2023 08:24:14 EDT Ventricular Rate:  58 PR Interval:  152 QRS Duration:  98 QT Interval:  448 QTC Calculation: 439 R Axis:   -21  Text Interpretation: Sinus bradycardia with Premature atrial complexes Previous EKG 09/23/2020 no significant changes Confirmed by Carlos Levering [id number]) on 04/16/2023 8:38:08 AM           Physical Exam    VS:  BP 118/74 (BP Location: Left Arm, Patient Position: Sitting, Cuff Size: Normal)   Pulse 70   Ht 5\' 10"  (1.778 m)   Wt (!) 365 lb 6.4 oz (165.7 kg)   SpO2 98%  BMI 52.43 kg/m  , BMI Body mass index is 52.43 kg/m.  GEN: Well nourished, well developed, in no acute distress. Neck: No JVD or carotid bruits. Cardiac:  RRR. Occasional extrasystole. No murmurs. No rubs or gallops.   Respiratory:  Respirations regular and unlabored. Clear to auscultation without rales, wheezing or rhonchi. GI: Soft, nontender, nondistended. Extremities: Radials/DP/PT 2+ and equal bilaterally. No clubbing or cyanosis. No edema.   Skin: Warm and dry, no rash. Neuro: Strength intact.  Assessment & Plan      Premature Atrial Complexes (PACs) Asymptomatic, benign, and not new for the patient. No palpitations, chest pain, shortness of breath, activity intolerance, or swelling. PACs were present in 2022 EKG. No changes in EKG compared to two years ago. Occasional extrasystole on exam.  -Consider a heart monitor if patient develops palpitations or if patient's concern persists after restarting Concerta.  Hypertension Well-controlled on Amlodipine with a current BP of 118/74. Patient expressed interest in reducing medication with further weight loss. -Continue current management. -Consider reducing Amlodipine dose if BP remains controlled and patient remains asymptomatic.  Weight Loss Patient has lost 39 pounds since starting Zeposia and joining a wellness program. -Congratulate patient on weight loss and encourage continuation of current management.  Possible Sleep Apnea Patient is scheduled for a sleep study due to snoring and large neck circumference. -Encourage patient to follow through with sleep study. -Explain potential benefits of treating sleep apnea, including potential improvement in blood pressure.       Disposition: Return in 1 year or sooner as needed.          Signed, Etta Grandchild. Clovis Mankins, DNP, NP-C

## 2023-04-16 ENCOUNTER — Encounter: Payer: Self-pay | Admitting: Student

## 2023-04-16 ENCOUNTER — Ambulatory Visit: Payer: 59 | Attending: Cardiology | Admitting: Student

## 2023-04-16 VITALS — BP 118/74 | HR 70 | Ht 70.0 in | Wt 365.4 lb

## 2023-04-16 DIAGNOSIS — I491 Atrial premature depolarization: Secondary | ICD-10-CM | POA: Diagnosis not present

## 2023-04-16 DIAGNOSIS — R634 Abnormal weight loss: Secondary | ICD-10-CM | POA: Diagnosis not present

## 2023-04-16 DIAGNOSIS — I10 Essential (primary) hypertension: Secondary | ICD-10-CM | POA: Diagnosis not present

## 2023-04-16 NOTE — Patient Instructions (Signed)
Medication Instructions:  No changes *If you need a refill on your cardiac medications before your next appointment, please call your pharmacy*   Lab Work: none If you have labs (blood work) drawn today and your tests are completely normal, you will receive your results only by: MyChart Message (if you have MyChart) OR A paper copy in the mail If you have any lab test that is abnormal or we need to change your treatment, we will call you to review the results.   Testing/Procedures: none   Follow-Up: At [facility], you and your health needs are our priority.  As part of our continuing mission to provide you with exceptional heart care, we have created designated Provider Care Teams.  These Care Teams include your primary Cardiologist (physician) and Advanced Practice Providers (APPs -  Physician Assistants and Nurse Practitioners) who all work together to provide you with the care you need, when you need it.  We recommend signing up for the patient portal called "MyChart".  Sign up information is provided on this After Visit Summary.  MyChart is used to connect with patients for Virtual Visits (Telemedicine).  Patients are able to view lab/test results, encounter notes, upcoming appointments, etc.  Non-urgent messages can be sent to your provider as well.   To learn more about what you can do with MyChart, go to [email or web address].    Your next appointment:   1 year(s)  Provider:   Little Ishikawa, MD

## 2023-04-30 ENCOUNTER — Encounter: Payer: Self-pay | Admitting: Internal Medicine

## 2023-04-30 ENCOUNTER — Ambulatory Visit: Payer: 59 | Admitting: Internal Medicine

## 2023-04-30 ENCOUNTER — Other Ambulatory Visit: Payer: Self-pay | Admitting: Internal Medicine

## 2023-04-30 VITALS — BP 112/84 | HR 72 | Temp 98.3°F | Ht 70.0 in | Wt 359.0 lb

## 2023-04-30 DIAGNOSIS — E538 Deficiency of other specified B group vitamins: Secondary | ICD-10-CM

## 2023-04-30 DIAGNOSIS — I10 Essential (primary) hypertension: Secondary | ICD-10-CM | POA: Diagnosis not present

## 2023-04-30 DIAGNOSIS — D509 Iron deficiency anemia, unspecified: Secondary | ICD-10-CM

## 2023-04-30 DIAGNOSIS — E559 Vitamin D deficiency, unspecified: Secondary | ICD-10-CM | POA: Diagnosis not present

## 2023-04-30 DIAGNOSIS — R21 Rash and other nonspecific skin eruption: Secondary | ICD-10-CM | POA: Insufficient documentation

## 2023-04-30 DIAGNOSIS — J3089 Other allergic rhinitis: Secondary | ICD-10-CM

## 2023-04-30 DIAGNOSIS — K219 Gastro-esophageal reflux disease without esophagitis: Secondary | ICD-10-CM

## 2023-04-30 LAB — CBC
HCT: 42.9 % (ref 36.0–46.0)
Hemoglobin: 13.6 g/dL (ref 12.0–15.0)
MCHC: 31.7 g/dL (ref 30.0–36.0)
MCV: 86.5 fL (ref 78.0–100.0)
Platelets: 243 10*3/uL (ref 150.0–400.0)
RBC: 4.96 Mil/uL (ref 3.87–5.11)
RDW: 16.4 % — ABNORMAL HIGH (ref 11.5–15.5)
WBC: 4.8 10*3/uL (ref 4.0–10.5)

## 2023-04-30 LAB — LIPID PANEL
Cholesterol: 178 mg/dL (ref 0–200)
HDL: 61 mg/dL (ref >39.00)
LDL Cholesterol: 103 mg/dL — ABNORMAL HIGH (ref 0–99)
NonHDL: 116.72
Total CHOL/HDL Ratio: 3
Triglycerides: 71 mg/dL (ref 0.0–149.0)
VLDL: 14.2 mg/dL (ref 0.0–40.0)

## 2023-04-30 LAB — COMPREHENSIVE METABOLIC PANEL
ALT: 19 U/L (ref 0–35)
AST: 21 U/L (ref 0–37)
Albumin: 4.1 g/dL (ref 3.5–5.2)
Alkaline Phosphatase: 59 U/L (ref 39–117)
BUN: 15 mg/dL (ref 6–23)
CO2: 28 meq/L (ref 19–32)
Calcium: 9.8 mg/dL (ref 8.4–10.5)
Chloride: 102 meq/L (ref 96–112)
Creatinine, Ser: 1.11 mg/dL (ref 0.40–1.20)
GFR: 54.17 mL/min — ABNORMAL LOW (ref >60.00)
Glucose, Bld: 84 mg/dL (ref 70–99)
Potassium: 4.4 meq/L (ref 3.5–5.1)
Sodium: 139 meq/L (ref 135–145)
Total Bilirubin: 0.7 mg/dL (ref 0.2–1.2)
Total Protein: 7 g/dL (ref 6.0–8.3)

## 2023-04-30 LAB — VITAMIN B12: Vitamin B-12: 398 pg/mL (ref 211–911)

## 2023-04-30 LAB — VITAMIN D 25 HYDROXY (VIT D DEFICIENCY, FRACTURES): VITD: 16.82 ng/mL — ABNORMAL LOW (ref 30.00–100.00)

## 2023-04-30 LAB — HEMOGLOBIN A1C: Hgb A1c MFr Bld: 5.5 % (ref 4.6–6.5)

## 2023-04-30 MED ORDER — AMLODIPINE BESYLATE 5 MG PO TABS: 5.0000 mg | ORAL_TABLET | Freq: Every day | ORAL | 1 refills | Status: DC

## 2023-04-30 MED ORDER — VITAMIN D (ERGOCALCIFEROL) 1.25 MG (50000 UNIT) PO CAPS: 50000.0000 [IU] | ORAL_CAPSULE | ORAL | 0 refills | Status: DC

## 2023-04-30 MED ORDER — NYSTATIN-TRIAMCINOLONE 100000-0.1 UNIT/GM-% EX OINT: 1.0000 | TOPICAL_OINTMENT | Freq: Two times a day (BID) | CUTANEOUS | 3 refills | Status: AC

## 2023-04-30 NOTE — Assessment & Plan Note (Signed)
Checking lipid panel and HgA1c and adjust as needed.

## 2023-04-30 NOTE — Assessment & Plan Note (Signed)
Checking CBC and adjust as needed.  

## 2023-04-30 NOTE — Assessment & Plan Note (Signed)
Taking omeprazole 20 mg daily and rare flares of this. She will continue and we will continue to assess with weight loss as that can help with GERD control.

## 2023-04-30 NOTE — Assessment & Plan Note (Signed)
Taking zyrtec and previously has done allergy shots may consider resuming at certain points.

## 2023-04-30 NOTE — Assessment & Plan Note (Signed)
BP very controlled and lost significant amount of weight in the last few months with zepbound. Will reduce amlodipine to 5 mg daily. Follow up 1-2 months and if still well controlled can try off. Due for CMP and CBC and lipid panel today which are ordered.

## 2023-04-30 NOTE — Assessment & Plan Note (Signed)
Checking vitamin D and adjust as needed. 

## 2023-04-30 NOTE — Assessment & Plan Note (Signed)
Checking B12 level and adjust as needed.  

## 2023-04-30 NOTE — Patient Instructions (Addendum)
You can get the shingles and tetanus vaccine.  We have sent in the cream to use twice a day as needed for the rash areas.   We will check the labs today.

## 2023-04-30 NOTE — Progress Notes (Signed)
   Subjective:   Patient ID: Kendra Haney, female    DOB: 1961/03/26, 62 y.o.   MRN: [id number]  HPI The patient is a new patient coming in for ongoing care see A/P for details.   PMH, [facility], social history reviewed and updated  Review of Systems  Constitutional: Negative.   HENT: Negative.    Eyes: Negative.   Respiratory:  Negative for cough, chest tightness and shortness of breath.   Cardiovascular:  Negative for chest pain, palpitations and leg swelling.  Gastrointestinal:  Positive for abdominal distention. Negative for abdominal pain, constipation, diarrhea, nausea and vomiting.  Musculoskeletal: Negative.   Skin: Negative.   Neurological: Negative.   Psychiatric/Behavioral: Negative.      Objective:  Physical Exam Constitutional:      Appearance: She is well-developed. She is obese.  HENT:     Head: Normocephalic and atraumatic.  Cardiovascular:     Rate and Rhythm: Normal rate and regular rhythm.  Pulmonary:     Effort: Pulmonary effort is normal. No respiratory distress.     Breath sounds: Normal breath sounds. No wheezing or rales.  Abdominal:     General: Bowel sounds are normal. There is no distension.     Palpations: Abdomen is soft.     Tenderness: There is no abdominal tenderness. There is no rebound.  Musculoskeletal:     Cervical back: Normal range of motion.  Skin:    General: Skin is warm and dry.  Neurological:     Mental Status: She is alert and oriented to person, place, and time.     Coordination: Coordination normal.     Vitals:   04/30/23 0907  BP: 112/84  Pulse: 72  Temp: 98.3 F (36.8 C)  TempSrc: Oral  SpO2: 98%  Weight: (!) 359 lb (162.8 kg)  Height: 5\' 10"  (1.778 m)    Assessment & Plan:

## 2023-04-30 NOTE — Assessment & Plan Note (Signed)
Rx nystatin/triamcinolone cream to use BID for the rash.

## 2023-05-05 ENCOUNTER — Other Ambulatory Visit: Payer: Self-pay

## 2023-05-05 ENCOUNTER — Other Ambulatory Visit (HOSPITAL_BASED_OUTPATIENT_CLINIC_OR_DEPARTMENT_OTHER): Payer: Self-pay

## 2023-05-05 MED ORDER — ONDANSETRON HCL 4 MG PO TABS
ORAL_TABLET | ORAL | 0 refills | Status: AC
Start: 2023-05-05 — End: ?
  Filled 2023-05-05: qty 9, 3d supply, fill #0

## 2023-05-07 ENCOUNTER — Other Ambulatory Visit (HOSPITAL_BASED_OUTPATIENT_CLINIC_OR_DEPARTMENT_OTHER): Payer: Self-pay

## 2023-05-10 ENCOUNTER — Other Ambulatory Visit (HOSPITAL_BASED_OUTPATIENT_CLINIC_OR_DEPARTMENT_OTHER): Payer: Self-pay

## 2023-05-10 ENCOUNTER — Other Ambulatory Visit: Payer: Self-pay

## 2023-05-10 MED ORDER — ZEPBOUND 7.5 MG/0.5ML ~~LOC~~ SOAJ
7.5000 mg | SUBCUTANEOUS | 0 refills | Status: DC
  Filled 2023-05-10: qty 2, 28d supply, fill #0

## 2023-06-03 ENCOUNTER — Other Ambulatory Visit (HOSPITAL_BASED_OUTPATIENT_CLINIC_OR_DEPARTMENT_OTHER): Payer: Self-pay

## 2023-06-03 MED ORDER — ONDANSETRON HCL 4 MG PO TABS
4.0000 mg | ORAL_TABLET | Freq: Four times a day (QID) | ORAL | 0 refills | Status: AC | PRN
Start: 2023-06-03 — End: ?
  Filled 2023-06-03: qty 9, 3d supply, fill #0

## 2023-06-09 ENCOUNTER — Other Ambulatory Visit (HOSPITAL_BASED_OUTPATIENT_CLINIC_OR_DEPARTMENT_OTHER): Payer: Self-pay

## 2023-06-09 MED ORDER — ZEPBOUND 10 MG/0.5ML ~~LOC~~ SOAJ
10.0000 mg | SUBCUTANEOUS | 1 refills | Status: AC
  Filled 2023-06-09: qty 2, 28d supply, fill #0

## 2023-06-30 ENCOUNTER — Other Ambulatory Visit (HOSPITAL_BASED_OUTPATIENT_CLINIC_OR_DEPARTMENT_OTHER): Payer: Self-pay

## 2023-06-30 MED ORDER — ZEPBOUND 10 MG/0.5ML ~~LOC~~ SOAJ
10.0000 mg | SUBCUTANEOUS | 1 refills | Status: AC
  Filled 2023-06-30: qty 2, 28d supply, fill #0

## 2023-07-21 ENCOUNTER — Encounter: Payer: Self-pay | Admitting: Family Medicine

## 2023-07-21 ENCOUNTER — Ambulatory Visit: Payer: 59 | Admitting: Family Medicine

## 2023-07-21 VITALS — BP 128/84 | HR 60 | Temp 98.0°F | Ht 70.0 in | Wt 324.0 lb

## 2023-07-21 DIAGNOSIS — R051 Acute cough: Secondary | ICD-10-CM

## 2023-07-21 DIAGNOSIS — R062 Wheezing: Secondary | ICD-10-CM | POA: Diagnosis not present

## 2023-07-21 DIAGNOSIS — J4530 Mild persistent asthma, uncomplicated: Secondary | ICD-10-CM

## 2023-07-21 MED ORDER — PREDNISONE 20 MG PO TABS: 40.0000 mg | ORAL_TABLET | Freq: Every day | ORAL | 0 refills | Status: DC

## 2023-07-21 NOTE — Progress Notes (Signed)
 Subjective:     Patient ID: Kendra Haney, female    DOB: 1961/02/07, 63 y.o.   MRN: [id number]  Chief Complaint  Patient presents with   Cough    Still having residual coughing and wheezing left over from the flu she had on NYE. Albuterol  has helped some and feeling better than when she made appt but still worried about wheezing and fatigue    Cough Associated symptoms include wheezing. Pertinent negatives include no chest pain, chills, ear pain, fever, sore throat or shortness of breath.     History of Present Illness         C/o  non productive cough, wheezing and chest congestion which was preceded by flu like symptoms that started on 07/13/2023. Reports feeling 80% improved.  She has underlying asthma. Uses Qvar  daily. Used albuterol  over the past 2-3 days.   No longer having fever, chills or flu like symptoms.   Denies dizziness, chest pain, palpitations, abdominal pain, N/V/D.      Health Maintenance Due  Topic Date Due   Zoster Vaccines- Shingrix (1 of 2) Never done   Cervical Cancer Screening (HPV/Pap Cotest)  Never done   COVID-19 Vaccine (3 - Pfizer risk series) 11/24/2019   DTaP/Tdap/Td (2 - Td or Tdap) 05/19/2020   MAMMOGRAM  07/10/2023    Past Medical History:  Diagnosis Date   Anxiety    Arthritis    Asthma    greater than 20 years ago    Cancer (HCC)    hx of skin cancer on back    Complication of anesthesia    Nausea  & vomiting during eye surgery   Depression    DVT (deep venous thrombosis) (HCC)    12/2011    Family history of adverse reaction to anesthesia    sister has problems with nausea and vomiting    GERD (gastroesophageal reflux disease)    H/O varicella    Hydradenitis 04/14/11   Hypertension    hx of hypertension no longer on meds    Menopausal symptoms 10/14/10   Menses, irregular 07/02/10   PONV (postoperative nausea and vomiting)    Vulvar itching 04/14/11   Yeast infection     Past Surgical History:  Procedure Laterality  Date   EYE MUSCLE SURGERY     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   TONSILLECTOMY     63 years old   TOTAL KNEE ARTHROPLASTY Bilateral 10/24/2014   Procedure: TOTAL KNEE BILATERAL;  Surgeon: Dempsey Moan, MD;  Location: [facility];  Service: Orthopedics;  Laterality: Bilateral;   WISDOM TOOTH EXTRACTION      Family History  Problem Relation Age of Onset   Hypertension Paternal Grandmother    Cancer Father    Hypertension Mother    Breast cancer Neg Hx     Social History   Socioeconomic History   Marital status: Single    Spouse name: Not on file   Number of children: Not on file   Years of education: Not on file   Highest education level: Not on file  Occupational History   Not on file  Tobacco Use   Smoking status: Never   Smokeless tobacco: Never  Substance and Sexual Activity   Alcohol use: No   Drug use: No   Sexual activity: Never    Birth control/protection: Abstinence, None  Other Topics Concern   Not on file  Social History Narrative   Not on file   Social Drivers of Health  Financial Resource Strain: Not on file  Food Insecurity: Not on file  Transportation Needs: Not on file  Physical Activity: Not on file  Stress: Not on file  Social Connections: Not on file  Intimate Partner Violence: Not on file    Outpatient Medications Prior to Visit  Medication Sig Dispense Refill   acetaminophen  (TYLENOL ) 500 MG tablet Take 650 mg by mouth every 6 (six) hours as needed for mild pain.      albuterol  (VENTOLIN  HFA) 108 (90 Base) MCG/ACT inhaler Inhale 2 puffs into the lungs every 6 (six) hours as needed for wheezing or shortness of breath. 54 g 1   ALPRAZolam  (XANAX ) 1 MG tablet Take 1 mg by mouth daily as needed.     amLODipine  (NORVASC ) 5 MG tablet Take 1 tablet (5 mg total) by mouth daily. 90 tablet 1   buPROPion  (WELLBUTRIN  XL) 150 MG 24 hr tablet Take 450 mg by mouth every morning. Patient takes 2 tablets daily.     busPIRone  (BUSPAR ) 15 MG tablet Take 1 tablet by  mouth as directed.     cetirizine  (ZYRTEC ) 10 MG tablet Take 1 tablet (10 mg total) by mouth daily as needed for allergies (Can take an extra dose during flare ups.). 90 tablet 1   clonazePAM (KLONOPIN) 1 MG tablet Take 1 mg by mouth daily as needed.     methylphenidate 54 MG PO CR tablet Take 54 mg by mouth every morning.     montelukast  (SINGULAIR ) 10 MG tablet Take 1 tablet (10 mg total) by mouth at bedtime. 90 tablet 1   nystatin -triamcinolone  ointment (MYCOLOG) Apply 1 Application topically 2 (two) times daily. 100 g 3   omeprazole (PRILOSEC) 20 MG capsule Take 20 mg by mouth daily.     ondansetron  (ZOFRAN ) 4 MG tablet Take 1 tablet (4 mg total) by mouth every 6 (six) hours as needed. 20 tablet 0   ondansetron  (ZOFRAN ) 4 MG tablet Take 1 tablet (4 mg total) by mouth every 6 (six) hours as needed. 30 tablet 0   ondansetron  (ZOFRAN ) 4 MG tablet 1 tablet Orally every 6 hours as needed 30 days 30 tablet 0   ondansetron  (ZOFRAN ) 4 MG tablet Take 1 tablet (4 mg total) by mouth every 6 (six) hours as needed. 30 tablet 0   Probiotic Product (PROBIOTIC DAILY PO) Take 1 capsule by mouth daily at 12 noon.     QVAR  REDIHALER 80 MCG/ACT inhaler Inhale 2 puffs into the lungs 2 (two) times daily. 31.8 g 1   Spacer/Aero-Holding Chambers DEVI 1 Device by Does not apply route as directed. 1 each 1   tirzepatide  (ZEPBOUND ) 10 MG/0.5ML Pen Inject 10 mg into the skin once a week. 2 mL 1   tirzepatide  (ZEPBOUND ) 10 MG/0.5ML Pen Inject 10 mg into the skin once a week. 2 mL 1   triamcinolone  (NASACORT ) 55 MCG/ACT AERO nasal inhaler Place 2 sprays into the nose daily. 51 g 1   Vitamin D , Ergocalciferol , (DRISDOL ) 1.25 MG (50000 UNIT) CAPS capsule Take 1 capsule (50,000 Units total) by mouth every 7 (seven) days. 12 capsule 0   vortioxetine HBr (TRINTELLIX) 20 MG TABS tablet Take 20 mg by mouth daily.     tirzepatide  (ZEPBOUND ) 7.5 MG/0.5ML Pen Inject 7.5 mg into the skin once a week. 2 mL 0   tirzepatide  (ZEPBOUND )  5 MG/0.5ML Pen Inject 5 mg into the skin every 7 (seven) days. (Patient not taking: Reported on 07/21/2023) 2 mL 0   No  facility-administered medications prior to visit.    Allergies  Allergen Reactions   Doxycycline Nausea And Vomiting   Dust Mite Extract     UNKNOWN   Mold Extract [Trichophyton Mentagrophyte]     UNKNOWN   Nsaids Other (See Comments)    GI UPSET    Tetracyclines & Related Nausea And Vomiting    Review of Systems  Constitutional:  Positive for malaise/fatigue. Negative for chills and fever.  HENT:  Positive for congestion. Negative for ear pain and sore throat.   Respiratory:  Positive for cough and wheezing. Negative for shortness of breath.   Cardiovascular:  Negative for chest pain, palpitations and leg swelling.  Gastrointestinal:  Negative for abdominal pain, constipation, diarrhea, nausea and vomiting.  Neurological:  Negative for dizziness and focal weakness.       Objective:    Physical Exam Constitutional:      General: She is not in acute distress.    Appearance: She is not ill-appearing.  Eyes:     Extraocular Movements: Extraocular movements intact.     Conjunctiva/sclera: Conjunctivae normal.  Cardiovascular:     Rate and Rhythm: Normal rate and regular rhythm.  Pulmonary:     Effort: Pulmonary effort is normal.     Breath sounds: Wheezing present. No rhonchi or rales.     Comments: Exp wheezing -faint Musculoskeletal:     Cervical back: Normal range of motion and neck supple. No tenderness.  Lymphadenopathy:     Cervical: No cervical adenopathy.  Skin:    General: Skin is warm and dry.  Neurological:     General: No focal deficit present.     Mental Status: She is alert and oriented to person, place, and time.  Psychiatric:        Mood and Affect: Mood normal.        Behavior: Behavior normal.        Thought Content: Thought content normal.      BP 128/84 (BP Location: Left Arm, Patient Position: Sitting, Cuff Size: Large)    Pulse 60   Temp 98 F (36.7 C) (Temporal)   Ht 5' 10 (1.778 m)   Wt (!) 324 lb (147 kg)   SpO2 100%   BMI 46.49 kg/m  Wt Readings from Last 3 Encounters:  07/21/23 (!) 324 lb (147 kg)  04/30/23 (!) 359 lb (162.8 kg)  04/16/23 (!) 365 lb 6.4 oz (165.7 kg)       Assessment & Plan:   Problem List Items Addressed This Visit   None Visit Diagnoses       Acute cough    -  Primary     Wheezing       Relevant Medications   predniSONE  (DELTASONE ) 20 MG tablet     Mild persistent asthma without complication       Relevant Medications   predniSONE  (DELTASONE ) 20 MG tablet      9 day course of URI and lower resp symptoms, improving except for cough, chest congestion and wheezing. She has asthma. Albuterol  has helped. Negative covid tests at home. Did not test for flu.  Oral steroids prescribed. Cont Qvar  and albuterol .  Follow up if worsening and then I recommend chest X ray and antibiotic as next steps in care.   I am having Kendra Haney start on predniSONE . I am also having her maintain her buPROPion , acetaminophen , vortioxetine HBr, Probiotic Product (PROBIOTIC DAILY PO), ALPRAZolam , busPIRone , clonazePAM, methylphenidate, Spacer/Aero-Holding Chambers, omeprazole, albuterol , cetirizine , montelukast , triamcinolone , Qvar  RediHaler,  ondansetron , ondansetron , nystatin -triamcinolone  ointment, amLODipine , Vitamin D  (Ergocalciferol ), ondansetron , ondansetron , Zepbound , and Zepbound .  Meds ordered this encounter  Medications   predniSONE  (DELTASONE ) 20 MG tablet    Sig: Take 2 tablets (40 mg total) by mouth daily with breakfast.    Dispense:  10 tablet    Refill:  0    Supervising Provider:   ROLLENE NORRIS A [[id number]]

## 2023-07-21 NOTE — Patient Instructions (Signed)
 Take the steroids once daily by mouth with food and water.  Take them as early in the day as possible to prevent sleep difficulties.   Continue using your albuterol inhaler until you are back to baseline.  Follow-up if you are getting worse

## 2023-07-27 ENCOUNTER — Encounter: Payer: Self-pay | Admitting: Internal Medicine

## 2023-07-27 MED ORDER — BENZONATATE 200 MG PO CAPS: 200.0000 mg | ORAL_CAPSULE | Freq: Three times a day (TID) | ORAL | 0 refills | Status: DC | PRN

## 2023-08-09 ENCOUNTER — Other Ambulatory Visit (HOSPITAL_BASED_OUTPATIENT_CLINIC_OR_DEPARTMENT_OTHER): Payer: Self-pay

## 2023-08-09 MED ORDER — ZEPBOUND 10 MG/0.5ML ~~LOC~~ SOAJ
10.0000 mg | SUBCUTANEOUS | 1 refills | Status: AC
  Filled 2023-08-09: qty 2, 28d supply, fill #0

## 2023-08-18 ENCOUNTER — Other Ambulatory Visit (HOSPITAL_BASED_OUTPATIENT_CLINIC_OR_DEPARTMENT_OTHER): Payer: Self-pay

## 2023-08-18 MED ORDER — TOPIRAMATE 25 MG PO TABS
25.0000 mg | ORAL_TABLET | Freq: Every day | ORAL | 0 refills | Status: DC
  Filled 2023-08-18: qty 30, 30d supply, fill #0

## 2023-08-18 MED ORDER — ONDANSETRON HCL 4 MG PO TABS
4.0000 mg | ORAL_TABLET | Freq: Four times a day (QID) | ORAL | 0 refills | Status: AC | PRN
  Filled 2023-08-18: qty 9, 10d supply, fill #0

## 2023-09-13 ENCOUNTER — Other Ambulatory Visit (HOSPITAL_BASED_OUTPATIENT_CLINIC_OR_DEPARTMENT_OTHER): Payer: Self-pay

## 2023-09-13 MED ORDER — ZEPBOUND 10 MG/0.5ML ~~LOC~~ SOAJ
10.0000 mg | SUBCUTANEOUS | 1 refills | Status: AC
  Filled 2023-09-13: qty 2, 28d supply, fill #0

## 2023-10-04 ENCOUNTER — Ambulatory Visit (INDEPENDENT_AMBULATORY_CARE_PROVIDER_SITE_OTHER): Payer: 59 | Admitting: Internal Medicine

## 2023-10-04 ENCOUNTER — Encounter: Payer: Self-pay | Admitting: Internal Medicine

## 2023-10-04 VITALS — BP 114/82 | HR 63 | Temp 97.6°F | Ht 70.0 in | Wt 316.0 lb

## 2023-10-04 DIAGNOSIS — D509 Iron deficiency anemia, unspecified: Secondary | ICD-10-CM | POA: Diagnosis not present

## 2023-10-04 DIAGNOSIS — K219 Gastro-esophageal reflux disease without esophagitis: Secondary | ICD-10-CM

## 2023-10-04 DIAGNOSIS — Z23 Encounter for immunization: Secondary | ICD-10-CM | POA: Diagnosis not present

## 2023-10-04 DIAGNOSIS — Z0000 Encounter for general adult medical examination without abnormal findings: Secondary | ICD-10-CM | POA: Diagnosis not present

## 2023-10-04 DIAGNOSIS — I10 Essential (primary) hypertension: Secondary | ICD-10-CM

## 2023-10-04 DIAGNOSIS — Z6841 Body Mass Index (BMI) 40.0 and over, adult: Secondary | ICD-10-CM

## 2023-10-04 LAB — COMPREHENSIVE METABOLIC PANEL WITH GFR
ALT: 14 U/L (ref 0–35)
AST: 16 U/L (ref 0–37)
Albumin: 4.1 g/dL (ref 3.5–5.2)
Alkaline Phosphatase: 64 U/L (ref 39–117)
BUN: 21 mg/dL (ref 6–23)
CO2: 26 meq/L (ref 19–32)
Calcium: 9.4 mg/dL (ref 8.4–10.5)
Chloride: 105 meq/L (ref 96–112)
Creatinine, Ser: 1.05 mg/dL (ref 0.40–1.20)
GFR: 57.73 mL/min — ABNORMAL LOW
Glucose, Bld: 88 mg/dL (ref 70–99)
Potassium: 4.1 meq/L (ref 3.5–5.1)
Sodium: 140 meq/L (ref 135–145)
Total Bilirubin: 0.6 mg/dL (ref 0.2–1.2)
Total Protein: 6.9 g/dL (ref 6.0–8.3)

## 2023-10-04 LAB — CBC
HCT: 40.1 % (ref 36.0–46.0)
Hemoglobin: 13.4 g/dL (ref 12.0–15.0)
MCHC: 33.4 g/dL (ref 30.0–36.0)
MCV: 88.9 fl (ref 78.0–100.0)
Platelets: 279 10*3/uL (ref 150.0–400.0)
RBC: 4.51 Mil/uL (ref 3.87–5.11)
RDW: 14.4 % (ref 11.5–15.5)
WBC: 5.9 10*3/uL (ref 4.0–10.5)

## 2023-10-04 LAB — LIPID PANEL
Cholesterol: 181 mg/dL (ref 0–200)
HDL: 61.8 mg/dL (ref >39.00)
LDL Cholesterol: 110 mg/dL — ABNORMAL HIGH (ref 0–99)
NonHDL: 119.53
Total CHOL/HDL Ratio: 3
Triglycerides: 46 mg/dL (ref 0.0–149.0)
VLDL: 9.2 mg/dL (ref 0.0–40.0)

## 2023-10-04 LAB — HEMOGLOBIN A1C: Hgb A1c MFr Bld: 5.2 % (ref 4.6–6.5)

## 2023-10-04 MED ORDER — MONTELUKAST SODIUM 10 MG PO TABS: 10.0000 mg | ORAL_TABLET | Freq: Every evening | ORAL | 1 refills | Status: AC

## 2023-10-04 NOTE — Assessment & Plan Note (Signed)
 Weight continues to decrease. She is planning to increase zepbound to 12.5 mg daily at next weight loss visit. Checking lipid panel and HGA1c and cMP.

## 2023-10-04 NOTE — Assessment & Plan Note (Signed)
 Flu shot up to date. Pneumonia given 20. Shingrix due. Tetanus given. Cologuard up to date. Mammogram due she will schedule, pap smear up to date. Counseled about sun safety and mole surveillance. Counseled about the dangers of distracted driving. Given 10 year screening recommendations.

## 2023-10-04 NOTE — Assessment & Plan Note (Signed)
 Checkng CBC and adjust as needed.

## 2023-10-04 NOTE — Assessment & Plan Note (Signed)
 Stable and less than prior due to diet changes. Using omeprazole otc as needed.

## 2023-10-04 NOTE — Assessment & Plan Note (Signed)
 BP is still normal on lower dose and weight down another 40 pounds since our last visit. Will stop amlodipine 5 mg daily and have her monitor BP at [facility] weight clinic which she sees every 3 weeks. If high she will let us know.

## 2023-10-04 NOTE — Patient Instructions (Addendum)
 We have given you the pneumonia and the first shingles vaccine.  We will have you stop the amlodipine.

## 2023-10-04 NOTE — Progress Notes (Signed)
   Subjective:   Patient ID: Kendra Haney, female    DOB: Dec 25, 1960, 63 y.o.   MRN: [id number]  HPI The patient is here for physical.  PMH, [facility], social history reviewed and updated  Review of Systems  Constitutional: Negative.   HENT: Negative.    Eyes: Negative.   Respiratory:  Negative for cough, chest tightness and shortness of breath.   Cardiovascular:  Negative for chest pain, palpitations and leg swelling.  Gastrointestinal:  Negative for abdominal distention, abdominal pain, constipation, diarrhea, nausea and vomiting.  Musculoskeletal: Negative.   Skin: Negative.   Neurological: Negative.   Psychiatric/Behavioral: Negative.      Objective:  Physical Exam Constitutional:      Appearance: She is well-developed. She is obese.  HENT:     Head: Normocephalic and atraumatic.  Cardiovascular:     Rate and Rhythm: Normal rate and regular rhythm.  Pulmonary:     Effort: Pulmonary effort is normal. No respiratory distress.     Breath sounds: Normal breath sounds. No wheezing or rales.  Abdominal:     General: Bowel sounds are normal. There is no distension.     Palpations: Abdomen is soft.     Tenderness: There is no abdominal tenderness. There is no rebound.  Musculoskeletal:     Cervical back: Normal range of motion.  Skin:    General: Skin is warm and dry.  Neurological:     Mental Status: She is alert and oriented to person, place, and time.     Coordination: Coordination normal.     Vitals:   10/04/23 0835  BP: 114/82  Pulse: 63  Temp: 97.6 F (36.4 C)  TempSrc: Temporal  SpO2: 100%  Weight: (!) 316 lb (143.3 kg)  Height: 5\' 10"  (1.778 m)    Assessment & Plan:  Prevnar 20 and tdap given at visit

## 2023-10-12 ENCOUNTER — Other Ambulatory Visit (HOSPITAL_BASED_OUTPATIENT_CLINIC_OR_DEPARTMENT_OTHER): Payer: Self-pay

## 2023-10-12 MED ORDER — TOPIRAMATE 25 MG PO TABS
25.0000 mg | ORAL_TABLET | Freq: Every day | ORAL | 0 refills | Status: AC
  Filled 2023-10-12: qty 30, 30d supply, fill #0

## 2023-10-20 ENCOUNTER — Other Ambulatory Visit (HOSPITAL_BASED_OUTPATIENT_CLINIC_OR_DEPARTMENT_OTHER): Payer: Self-pay

## 2023-10-20 MED ORDER — ZEPBOUND 12.5 MG/0.5ML ~~LOC~~ SOAJ
12.5000 mg | SUBCUTANEOUS | 1 refills | Status: AC
Start: 2023-10-20 — End: ?
  Filled 2023-10-20: qty 2, 28d supply, fill #0

## 2023-10-21 ENCOUNTER — Other Ambulatory Visit: Payer: Self-pay | Admitting: Internal Medicine

## 2023-10-28 ENCOUNTER — Other Ambulatory Visit (HOSPITAL_BASED_OUTPATIENT_CLINIC_OR_DEPARTMENT_OTHER): Payer: Self-pay | Admitting: Internal Medicine

## 2023-10-28 DIAGNOSIS — Z1231 Encounter for screening mammogram for malignant neoplasm of breast: Secondary | ICD-10-CM

## 2023-11-01 ENCOUNTER — Encounter (HOSPITAL_BASED_OUTPATIENT_CLINIC_OR_DEPARTMENT_OTHER): Payer: Self-pay | Admitting: Radiology

## 2023-11-01 ENCOUNTER — Ambulatory Visit (HOSPITAL_BASED_OUTPATIENT_CLINIC_OR_DEPARTMENT_OTHER)
Admission: RE | Admit: 2023-11-01 | Discharge: 2023-11-01 | Disposition: A | Discharge status: Home / Self Care | Source: Ambulatory Visit | Attending: Internal Medicine | Admitting: Internal Medicine

## 2023-11-01 DIAGNOSIS — Z1231 Encounter for screening mammogram for malignant neoplasm of breast: Secondary | ICD-10-CM | POA: Diagnosis present

## 2023-11-02 ENCOUNTER — Other Ambulatory Visit (HOSPITAL_BASED_OUTPATIENT_CLINIC_OR_DEPARTMENT_OTHER): Payer: Self-pay

## 2023-11-02 MED ORDER — ONDANSETRON HCL 4 MG PO TABS
4.0000 mg | ORAL_TABLET | Freq: Four times a day (QID) | ORAL | 0 refills | Status: AC | PRN
  Filled 2023-11-02: qty 30, 8d supply, fill #0

## 2023-11-05 ENCOUNTER — Encounter: Payer: Self-pay | Admitting: Internal Medicine

## 2023-11-10 ENCOUNTER — Encounter: Payer: Self-pay | Admitting: Internal Medicine

## 2023-11-10 LAB — HM MAMMOGRAPHY

## 2023-11-16 ENCOUNTER — Other Ambulatory Visit (HOSPITAL_BASED_OUTPATIENT_CLINIC_OR_DEPARTMENT_OTHER): Payer: Self-pay

## 2023-11-16 MED ORDER — ZEPBOUND 10 MG/0.5ML ~~LOC~~ SOAJ
10.0000 mg | SUBCUTANEOUS | 1 refills | Status: AC
  Filled 2023-11-16: qty 2, 28d supply, fill #0

## 2023-12-09 ENCOUNTER — Other Ambulatory Visit (HOSPITAL_BASED_OUTPATIENT_CLINIC_OR_DEPARTMENT_OTHER): Payer: Self-pay

## 2023-12-09 MED ORDER — ZEPBOUND 10 MG/0.5ML ~~LOC~~ SOAJ
10.0000 mg | SUBCUTANEOUS | 1 refills | Status: AC
  Filled 2023-12-09: qty 2, 28d supply, fill #0

## 2024-01-25 ENCOUNTER — Other Ambulatory Visit (HOSPITAL_BASED_OUTPATIENT_CLINIC_OR_DEPARTMENT_OTHER): Payer: Self-pay

## 2024-01-25 MED ORDER — ZEPBOUND 10 MG/0.5ML ~~LOC~~ SOAJ
10.0000 mg | SUBCUTANEOUS | 3 refills | Status: AC
  Filled 2024-01-25: qty 2, 28d supply, fill #0

## 2024-02-22 ENCOUNTER — Encounter: Payer: Self-pay | Admitting: Internal Medicine

## 2024-02-22 ENCOUNTER — Ambulatory Visit: Payer: Self-pay | Admitting: Internal Medicine

## 2024-02-22 ENCOUNTER — Ambulatory Visit: Admitting: Internal Medicine

## 2024-02-22 VITALS — BP 138/82 | HR 80 | Temp 98.0°F | Ht 70.0 in | Wt 294.0 lb

## 2024-02-22 DIAGNOSIS — K219 Gastro-esophageal reflux disease without esophagitis: Secondary | ICD-10-CM

## 2024-02-22 DIAGNOSIS — I10 Essential (primary) hypertension: Secondary | ICD-10-CM

## 2024-02-22 DIAGNOSIS — D509 Iron deficiency anemia, unspecified: Secondary | ICD-10-CM | POA: Diagnosis not present

## 2024-02-22 DIAGNOSIS — E559 Vitamin D deficiency, unspecified: Secondary | ICD-10-CM | POA: Diagnosis not present

## 2024-02-22 DIAGNOSIS — E538 Deficiency of other specified B group vitamins: Secondary | ICD-10-CM | POA: Diagnosis not present

## 2024-02-22 LAB — COMPREHENSIVE METABOLIC PANEL WITH GFR
ALT: 29 U/L (ref 0–35)
AST: 24 U/L (ref 0–37)
Albumin: 4 g/dL (ref 3.5–5.2)
Alkaline Phosphatase: 61 U/L (ref 39–117)
BUN: 22 mg/dL (ref 6–23)
CO2: 29 meq/L (ref 19–32)
Calcium: 9.1 mg/dL (ref 8.4–10.5)
Chloride: 104 meq/L (ref 96–112)
Creatinine, Ser: 0.9 mg/dL (ref 0.40–1.20)
GFR: 69.28 mL/min (ref >60.00)
Glucose, Bld: 78 mg/dL (ref 70–99)
Potassium: 4.2 meq/L (ref 3.5–5.1)
Sodium: 140 meq/L (ref 135–145)
Total Bilirubin: 0.5 mg/dL (ref 0.2–1.2)
Total Protein: 6.9 g/dL (ref 6.0–8.3)

## 2024-02-22 LAB — CBC
HCT: 39.7 % (ref 36.0–46.0)
Hemoglobin: 13.2 g/dL (ref 12.0–15.0)
MCHC: 33.2 g/dL (ref 30.0–36.0)
MCV: 87.8 fl (ref 78.0–100.0)
Platelets: 250 K/uL (ref 150.0–400.0)
RBC: 4.52 Mil/uL (ref 3.87–5.11)
RDW: 13.4 % (ref 11.5–15.5)
WBC: 5.7 K/uL (ref 4.0–10.5)

## 2024-02-22 LAB — HEMOGLOBIN A1C: Hgb A1c MFr Bld: 5.5 % (ref 4.6–6.5)

## 2024-02-22 LAB — VITAMIN B12: Vitamin B-12: 193 pg/mL — ABNORMAL LOW (ref 211–911)

## 2024-02-22 LAB — VITAMIN D 25 HYDROXY (VIT D DEFICIENCY, FRACTURES): VITD: 16.15 ng/mL — ABNORMAL LOW (ref 30.00–100.00)

## 2024-02-22 LAB — TSH: TSH: 1.39 u[IU]/mL (ref 0.35–5.50)

## 2024-02-22 LAB — FERRITIN: Ferritin: 41 ng/mL (ref 10.0–291.0)

## 2024-02-22 MED ORDER — SCOPOLAMINE 1 MG/3DAYS TD PT72: 1.0000 | MEDICATED_PATCH | TRANSDERMAL | 0 refills | Status: AC

## 2024-02-22 MED ORDER — VITAMIN D (ERGOCALCIFEROL) 1.25 MG (50000 UNIT) PO CAPS: 50000.0000 [IU] | ORAL_CAPSULE | ORAL | 0 refills | Status: AC

## 2024-02-22 NOTE — Patient Instructions (Signed)
The patch last 3 days and you place behind the ear. Switch ears with each patch. The main side effect is dry mouth which generally is mild. It can be bad enough that people remove the patch. Make sure to wash hands well after applying or removing patch as the medicine can be harmful if it accidentally gets into the eye.     

## 2024-02-22 NOTE — Progress Notes (Signed)
 Subjective:   Patient ID: Kendra Haney, female    DOB: May 22, 1961, 63 y.o.   MRN: [id number]  Discussed the use of AI scribe software for clinical note transcription with the patient, who gave verbal consent to proceed.  History of Present Illness Kendra Haney is a 63 year old female who presents with fatigue and memory issues.  She experiences significant fatigue and memory issues, which she partly attributes to her recent increase in physical activity with a Systems analyst. She is concerned that her nutritional intake may not be sufficient to support this increased activity level. 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 These symptoms occur approximately every couple of years, prompting her to consider testing her B12 levels again.  She is preparing for an upcoming cruise to [place]  and is concerned about potential motion sickness. She plans to use scopolamine  patches, which she has used previously postoperatively for shoulder surgery, to prevent motion sickness.  She recently discontinued Zepbound , which she had been taking for over a year. Initially, it was effective for weight management, but its efficacy decreased over time, and she experienced significant side effects at higher doses. She is now focusing on maintaining her weight through diet and exercise, having joined a [employer] and working with a Systems analyst twice a week. She is also working on developing healthy eating habits, avoiding processed foods, and managing her sugar intake by limiting portion sizes.  No new chest pains, tightness, pressure, breathing troubles, diarrhea, or constipation.  Review of Systems  Constitutional:  Positive for fatigue.  HENT: Negative.    Eyes: Negative.   Respiratory:  Negative for cough, chest tightness and shortness of breath.   Cardiovascular:  Negative for chest pain, palpitations and leg swelling.  Gastrointestinal:   Negative for abdominal distention, abdominal pain, constipation, diarrhea, nausea and vomiting.  Musculoskeletal:  Positive for arthralgias.  Skin: Negative.   Neurological: Negative.   Psychiatric/Behavioral: Negative.      Objective:  Physical Exam Constitutional:      Appearance: She is well-developed. She is obese.  HENT:     Head: Normocephalic and atraumatic.  Cardiovascular:     Rate and Rhythm: Normal rate and regular rhythm.  Pulmonary:     Effort: Pulmonary effort is normal. No respiratory distress.     Breath sounds: Normal breath sounds. No wheezing or rales.  Abdominal:     General: Bowel sounds are normal. There is no distension.     Palpations: Abdomen is soft.     Tenderness: There is no abdominal tenderness. There is no rebound.  Musculoskeletal:     Cervical back: Normal range of motion.  Skin:    General: Skin is warm and dry.  Neurological:     Mental Status: She is alert and oriented to person, place, and time.     Coordination: Coordination normal.     Vitals:   02/22/24 0848  BP: 138/82  Pulse: 80  Temp: 98 F (36.7 C)  SpO2: 97%  Weight: 294 lb (133.4 kg)  Height: 5' 10 (1.778 m)    Assessment and Plan Assessment & Plan Morbid obesity due to excess calories   She discontinued Zepbound  due to side effects and cost. The 10 mg dose was initially effective but lost efficacy, and increasing to 12.5 mg caused intolerable side effects. She is now focusing on exercise and dietary changes with a Systems analyst. Discussed the addictive nature of processed  foods and managing intake of sugar and processed foods. Continue exercise regimen with a personal trainer twice a week. Focus on healthy eating habits and portion control. Consider resuming Zepbound  if needed in the future.  Deficiency of B group vitamins (including B12)   B12 deficiency is likely due to long-term Nexium  use affecting absorption, causing fatigue and memory issues. She has previously  required B12 shots. Order a B12 level test and administer B12 shots if levels are low.

## 2024-02-24 ENCOUNTER — Ambulatory Visit (INDEPENDENT_AMBULATORY_CARE_PROVIDER_SITE_OTHER)

## 2024-02-24 DIAGNOSIS — E538 Deficiency of other specified B group vitamins: Secondary | ICD-10-CM

## 2024-02-24 MED ORDER — CYANOCOBALAMIN 1000 MCG/ML IJ SOLN
1000.0000 ug | Freq: Once | INTRAMUSCULAR | Status: AC
  Administered 2024-02-24: 1000 ug via INTRAMUSCULAR

## 2024-02-24 NOTE — Progress Notes (Addendum)
 After obtaining consent, and per orders of Dr. Rollene, injection of B12 given by Ronnald SHAUNNA Palms. Patient instructed to report any adverse reaction to me immediately.   Medical screening examination/treatment/procedure(s) were performed by non-physician practitioner and as supervising physician I was immediately available for consultation/collaboration.  I agree with above. Karlynn Noel, MD

## 2024-02-24 NOTE — Assessment & Plan Note (Signed)
Checking B12 and adjust as needed. 

## 2024-02-24 NOTE — Assessment & Plan Note (Signed)
 Checking CBC and ferritin and adjust as needed. 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

## 2024-02-24 NOTE — Assessment & Plan Note (Signed)
 Rx zepbound  to help with weight loss.

## 2024-02-24 NOTE — Assessment & Plan Note (Signed)
 Controlled on omeprazole continue daily.

## 2024-02-24 NOTE — Assessment & Plan Note (Signed)
 Checking CMP and adjust as needed diet controlled currently.

## 2024-02-24 NOTE — Assessment & Plan Note (Signed)
 Checking vitamin d  and adjust as needed.

## 2024-03-02 ENCOUNTER — Ambulatory Visit (INDEPENDENT_AMBULATORY_CARE_PROVIDER_SITE_OTHER)

## 2024-03-02 DIAGNOSIS — E538 Deficiency of other specified B group vitamins: Secondary | ICD-10-CM | POA: Diagnosis not present

## 2024-03-02 MED ORDER — CYANOCOBALAMIN 1000 MCG/ML IJ SOLN: 1000.0000 ug | Freq: Once | INTRAMUSCULAR | Status: DC

## 2024-03-02 MED ORDER — CYANOCOBALAMIN 1000 MCG/ML IJ SOLN
1000.0000 ug | Freq: Once | INTRAMUSCULAR | Status: AC
  Administered 2024-03-02: 1000 ug via INTRAMUSCULAR

## 2024-03-02 NOTE — Progress Notes (Signed)
 Pt here for monthly B12 injection per   B12 1000mcg given IM and pt tolerated injection well.  Patient responded well to b12 there were no questions or concerns   Please sign as MD is out of office

## 2024-03-09 ENCOUNTER — Ambulatory Visit (INDEPENDENT_AMBULATORY_CARE_PROVIDER_SITE_OTHER)

## 2024-03-09 ENCOUNTER — Encounter: Payer: Self-pay | Admitting: Internal Medicine

## 2024-03-09 DIAGNOSIS — E538 Deficiency of other specified B group vitamins: Secondary | ICD-10-CM

## 2024-03-09 MED ORDER — CYANOCOBALAMIN 1000 MCG/ML IJ SOLN
1000.0000 ug | Freq: Once | INTRAMUSCULAR | Status: AC
  Administered 2024-03-09: 1000 ug via INTRAMUSCULAR

## 2024-03-09 NOTE — Progress Notes (Signed)
 Pt here for monthly B12 injection per   B12 1000mcg given IM and pt tolerated injection well.  Patient has received there 3rd b12 today with no complications  Please sign off as Md is out of office

## 2024-03-16 ENCOUNTER — Ambulatory Visit (INDEPENDENT_AMBULATORY_CARE_PROVIDER_SITE_OTHER)

## 2024-03-16 DIAGNOSIS — E538 Deficiency of other specified B group vitamins: Secondary | ICD-10-CM

## 2024-03-16 MED ORDER — CYANOCOBALAMIN 1000 MCG/ML IJ SOLN
1000.0000 ug | Freq: Once | INTRAMUSCULAR | Status: AC
  Administered 2024-03-16: 1000 ug via INTRAMUSCULAR

## 2024-03-16 NOTE — Progress Notes (Signed)
 Pt here for monthly B12 injection per   B12 1000mcg given IM and pt tolerated injection well.  Patient has responded well to injection there is no question or concerns

## 2024-04-03 ENCOUNTER — Ambulatory Visit

## 2024-04-19 NOTE — Patient Instructions (Signed)
 Asthma Continue montelukast  10 mg once a day to prevent asthma symptoms Continue Qvar  80-2 puffs twice a day to prevent cough or wheeze Continue albuterol  2 puffs once every 4 hours if needed for cough or wheeze You may use albuterol  2 puffs 5-15 minutes before activity to decrease cough or wheeze   Allergic rhinitis Continue allergen avoidance measures directed toward grass pollen, weed pollem, ragweed pollen, tree pollen, indoor mold, outdoor mold, dust mites, cat and cockroach as listed below Start cetirizine  in th morning and levocetirizine at night. Remember to rotate to a different antihistamine about every 3 months. Some examples of over the counter antihistamines include Zyrtec  (cetirizine ), Xyzal (levocetirizine), Allegra  (fexofenadine ), and Claritin  (loratidine).  Continue Nasacort  2 sprays in each nostril once a day if needed for a stuffy nose Continue azelastine  2 sprays in each nostril up to twice a day if needed for a runny or itchy nose.  Consider saline nasal rinses as needed for nasal symptoms. Use this before any medicated nasal sprays for best result Consider allergen immunotherapy if your symptoms are not well controlled with the treatment plan as listed above. Written information provided Prednisone  10 mg tablets. Take 2 tablets once a day for 4 days, then take 1 tablet on the 5th day, then stop.    Allergic conjunctivitis Some over the counter eye drops include Pataday  one drop in each eye once a day as needed for red, itchy eyes OR Zaditor one drop in each eye twice a day as needed for red itchy eyes. Avoid eye drops that say red eye relief as they may contain medications that dry out your eyes.   Call the clinic if this treatment plan is not working well for you  Follow up in 2 months or sooner if needed.  Reducing Pollen Exposure The American Academy of All[employer] , Asthma and Immun[employer] suggests the following steps to reduce your exposure to pollen during allergy   seasons. Do not hang sheets or clothing out to dry; pollen may collect on these items. Do not mow lawns or spend time around freshly cut grass; mowing stirs up pollen. Keep windows closed at night.  Keep car windows closed while driving. Minimize morning activities outdoors, a time when pollen counts are usually at their highest. Stay indoors as much as possible when pollen counts or humidity is high and on windy days when pollen tends to remain in the air longer. Use air conditioning when possible.  Many air conditioners have filters that trap the pollen spores. Use a HEPA room air filter to remove pollen form the indoor air you breathe.  Control of Mold Allergen Mold and fungi can grow on a variety of surfaces provided certain temperature and moisture conditions exist.  Outdoor molds grow on plants, decaying vegetation and soil.  The major outdoor mold, Alternaria and Cladosporium, are found in very high numbers during hot and dry conditions.  Generally, a late Summer - Fall peak is seen for common outdoor fungal spores.  Rain will temporarily lower outdoor mold spore count, but counts rise rapidly when the rainy period ends.  The most important indoor molds are Aspergillus and Penicillium.  Dark, humid and poorly ventilated basements are ideal sites for mold growth.  The next most common sites of mold growth are the bathroom and the kitchen.  Outdoor [employer] Use air conditioning and keep windows closed Avoid exposure to decaying vegetation. Avoid leaf raking. Avoid grain handling. Consider wearing a face mask if working in moldy areas.  Indoor Mold Control Maintain humidity below 50%. Clean washable surfaces with 5% bleach solution. Remove sources e.g. Contaminated carpets.   Control of Dust Mite Allergen Dust mites play a major role in allergic asthma and rhinitis. They occur in environments with high humidity wherever human skin is found. Dust mites absorb humidity from the  atmosphere (ie, they do not drink) and feed on organic matter (including shed human and animal skin). Dust mites are a microscopic type of insect that you cannot see with the naked eye. High levels of dust mites have been detected from mattresses, pillows, carpets, upholstered furniture, bed covers, clothes, soft toys and any woven material. The principal allergen of the dust mite is found in its feces. A gram of dust may contain 1,000 mites and 250,000 fecal particles. Mite antigen is easily measured in the air during house cleaning activities. Dust mites do not bite and do not cause harm to humans, other than by triggering allergies/asthma.  Ways to decrease your exposure to dust mites in your home:  1. Encase mattresses, box springs and pillows with a mite-impermeable barrier or cover  2. Wash sheets, blankets and drapes weekly in hot water (130 F) with detergent and dry them in a dryer on the hot setting.  3. Have the room cleaned frequently with a vacuum cleaner and a damp dust-mop. For carpeting or rugs, vacuuming with a vacuum cleaner equipped with a high-efficiency particulate air (HEPA) filter. The dust mite allergic individual should not be in a room which is being cleaned and should wait 1 hour after cleaning before going into the room.  4. Do not sleep on upholstered furniture (eg, couches).  5. If possible removing carpeting, upholstered furniture and drapery from the home is ideal. Horizontal blinds should be eliminated in the rooms where the person spends the most time (bedroom, study, television room). Washable vinyl, roller-type shades are optimal.  6. Remove all non-washable stuffed toys from the bedroom. Wash stuffed toys weekly like sheets and blankets above.  7. Reduce indoor humidity to less than 50%. Inexpensive humidity monitors can be purchased at most hardware stores. Do not use a humidifier as can make the problem worse and are not recommended.  Control of Dog or Cat  Allergen Avoidance is the best way to manage a dog or cat allergy . If you have a dog or cat and are allergic to dog or cats, consider removing the dog or cat from the home. If you have a dog or cat but don't want to find it a new home, or if your family wants a pet even though someone in the household is allergic, here are some strategies that may help keep symptoms at bay:  Keep the pet out of your bedroom and restrict it to only a few rooms. Be advised that keeping the dog or cat in only one room will not limit the allergens to that room. Don't pet, hug or kiss the dog or cat; if you do, wash your hands with soap and water. High-efficiency particulate air (HEPA) cleaners run continuously in a bedroom or living room can reduce allergen levels over time. Regular use of a high-efficiency vacuum cleaner or a central vacuum can reduce allergen levels. Giving your dog or cat a bath at least once a week can reduce airborne allergen.  Control of Cockroach Allergen Cockroach allergen has been identified as an important cause of acute attacks of asthma, especially in urban settings.  There are fifty-five species of cockroach that exist  in the [place] , however only three, the [place], [place] and [place] species produce allergen that can affect patients with Asthma.  Allergens can be obtained from fecal particles, egg casings and secretions from cockroaches.    Remove food sources. Reduce access to water. Seal access and entry points. Spray runways with 0.5-1% Diazinon or Chlorpyrifos Blow boric acid power under stoves and refrigerator. Place bait stations (hydramethylnon) at feeding sites.

## 2024-04-19 NOTE — Progress Notes (Signed)
 [address] [place] [address] Dept: [phone]  FOLLOW UP NOTE  Patient ID: Kendra Haney, female    DOB: 1960/09/20  Age: 62 y.o. MRN: [id number] Date of Office Visit: 04/20/2024  Assessment  Chief Complaint: Follow-up (Allergies/Asthma ), Nasal Congestion (Runny nose sore throat), and Cough  HPI Kendra Haney is a 63 year old female who presents to the clinic for evaluation of allergic rhinitis. She was last seen in this clinic on 10/01/2022 by Dr. Jeneal for evaluation of asthma, allergic rhinitis, and allergic conjunctivitis.  Discussed the use of AI scribe software for clinical note transcription with the patient, who gave verbal consent to proceed.  History of Present Illness Kendra Haney is a 63 year old female with allergies who presents with exacerbated allergic reactions to fragrances and environmental triggers.  Since early September, she has experienced increased sensitivity to fragrances and environmental triggers, particularly at her workplace. Exposure to strong colognes and plug-in air fresheners leads to symptoms such as rhinorrhea, sore throat, nasal congestion, and malaise. These symptoms are more pronounced in the office environment and improve somewhat at home.  She previously used Zepbound  for a year, which provided significant relief from inflammation, allowing her to manage symptoms with Zyrtec  alone. However, she discontinued Zepbound  due to cost. Currently, she takes Zyrtec  twice daily, Nasacort , and decongestants, but finds these insufficient for symptom control. Her last environmental allergy  skin testing on 12/25/2021 was positive to grass pollen, weed pollem, ragweed pollen, tree pollen, indoor mold, outdoor mold, dust mites, cat and cockroach.   Her symptoms began around the time a colleague with a plug-in air freshener was in the office and worsened after a cruise trip where she was exposed to sick individuals. She tested negative for COVID-19 and  flu upon returning. Symptoms include sneezing, sore throat, rhinorrhea, and congestion, particularly in response to cinnamon brooms and other strong scents.  Regarding asthma, she reports no recent issues with dyspnea or wheezing. She uses Qvar  two puffs in the morning and has not needed her albuterol  since January, when she had the flu. She has not been taking montelukast  recently.  She experiences a dry, hacking cough, particularly when consuming cold beverages, and reports a sensitive throat. Her eyes sometimes become red and itchy, with sticky discharge affecting her contact lens use. She uses lubricating eye drops as needed.  She has a history of reflux and takes Prilosec regularly, which controls her heartburn symptoms.  Her current medications are listed in the chart.  Of note, she is requesting a small prednisone  taper to help resolve these acute symptoms.  We had a detailed discussion regarding the long-lasting side effects of prednisone .  She reports she last had a steroid about 3 years ago.  She is currently considering allergen immunotherapy and we discussed traditional and Rush therapy.  Written information was provided at today's visit for allergen immunotherapy.  Drug Allergies:  Allergies  Allergen Reactions   Doxycycline Nausea And Vomiting   Dust Mite Extract     UNKNOWN   Mold Extract [Trichophyton Mentagrophyte]     UNKNOWN   Nsaids Other (See Comments)    GI UPSET    Other     All Antibotics With Cycline   Tetracyclines & Related Nausea And Vomiting    Physical Exam: BP 130/82   Pulse 61   Temp 97.9 F (36.6 C)   Ht 5' 10 (1.778 m)   Wt (!) 314 lb (142.4 kg)   SpO2 98%  BMI 45.05 kg/m    Physical Exam Vitals reviewed.  Constitutional:      Appearance: Normal appearance.  HENT:     Head: Normocephalic and atraumatic.     Right Ear: Tympanic membrane normal.     Left Ear: Tympanic membrane normal.     Nose:     Comments: Bilateral nares slightly  erythematous with thin clear nasal drainage noted.  Pharynx normal.  Ears normal.  Eyes normal.    Mouth/Throat:     Pharynx: Oropharynx is clear.  Eyes:     Conjunctiva/sclera: Conjunctivae normal.  Cardiovascular:     Rate and Rhythm: Normal rate and regular rhythm.     Heart sounds: Normal heart sounds. No murmur heard. Pulmonary:     Effort: Pulmonary effort is normal.     Breath sounds: Normal breath sounds.     Comments: Lungs clear to auscultation Musculoskeletal:        General: Normal range of motion.     Cervical back: Normal range of motion and neck supple.  Skin:    General: Skin is warm and dry.  Neurological:     Mental Status: She is alert and oriented to person, place, and time.  Psychiatric:        Mood and Affect: Mood normal.        Behavior: Behavior normal.        Thought Content: Thought content normal.        Judgment: Judgment normal.     Diagnostics: FVC 2.68 which is 69% of predicted value, FEV1 2.35 which is 78% of predicted value.  Spirometry indicates normal ventilatory function.  Assessment and Plan: 1. Moderate persistent reactive airway disease without complication   2. Seasonal and perennial allergic rhinitis   3. Seasonal allergic conjunctivitis     Meds ordered this encounter  Medications   albuterol  (VENTOLIN  HFA) 108 (90 Base) MCG/ACT inhaler    Sig: Inhale 2 puffs into the lungs every 6 (six) hours as needed for wheezing or shortness of breath.    Dispense:  54 g    Refill:  1   QVAR  REDIHALER 80 MCG/ACT inhaler    Sig: Inhale 2 puffs into the lungs 2 (two) times daily.    Dispense:  31.8 g    Refill:  1   predniSONE  (DELTASONE ) 10 MG tablet    Sig: Prednisone  10 mg tablets. Take 2 tablets once a day for 4 days, then take 1 tablet on the 5th day, then stop.    Dispense:  9 tablet    Refill:  0    Patient Instructions  Asthma Continue montelukast  10 mg once a day to prevent asthma symptoms Continue Qvar  80-2 puffs twice a day  to prevent cough or wheeze Continue albuterol  2 puffs once every 4 hours if needed for cough or wheeze You may use albuterol  2 puffs 5-15 minutes before activity to decrease cough or wheeze   Allergic rhinitis Continue allergen avoidance measures directed toward grass pollen, weed pollem, ragweed pollen, tree pollen, indoor mold, outdoor mold, dust mites, cat and cockroach as listed below Start cetirizine  in th morning and levocetirizine at night. Remember to rotate to a different antihistamine about every 3 months. Some examples of over the counter antihistamines include Zyrtec  (cetirizine ), Xyzal (levocetirizine), Allegra  (fexofenadine ), and Claritin  (loratidine).  Continue Nasacort  2 sprays in each nostril once a day if needed for a stuffy nose Continue azelastine  2 sprays in each nostril up to twice a day if  needed for a runny or itchy nose.  Consider saline nasal rinses as needed for nasal symptoms. Use this before any medicated nasal sprays for best result Consider allergen immunotherapy if your symptoms are not well controlled with the treatment plan as listed above. Written information provided Prednisone  10 mg tablets. Take 2 tablets once a day for 4 days, then take 1 tablet on the 5th day, then stop.    Allergic conjunctivitis Some over the counter eye drops include Pataday  one drop in each eye once a day as needed for red, itchy eyes OR Zaditor one drop in each eye twice a day as needed for red itchy eyes. Avoid eye drops that say red eye relief as they may contain medications that dry out your eyes.   Call the clinic if this treatment plan is not working well for you  Follow up in 2 months or sooner if needed.  Return in about 2 months (around 06/20/2024), or if symptoms worsen or fail to improve.    Thank you for the opportunity to care for this patient.  Please do not hesitate to contact me with questions.  Arlean Mutter, FNP [facility] 

## 2024-04-20 ENCOUNTER — Encounter: Payer: Self-pay | Admitting: Family Medicine

## 2024-04-20 ENCOUNTER — Other Ambulatory Visit: Payer: Self-pay

## 2024-04-20 ENCOUNTER — Ambulatory Visit (INDEPENDENT_AMBULATORY_CARE_PROVIDER_SITE_OTHER): Admitting: Family Medicine

## 2024-04-20 VITALS — BP 130/82 | HR 61 | Temp 97.9°F | Ht 70.0 in | Wt 314.0 lb

## 2024-04-20 DIAGNOSIS — J4540 Moderate persistent asthma, uncomplicated: Secondary | ICD-10-CM

## 2024-04-20 DIAGNOSIS — J302 Other seasonal allergic rhinitis: Secondary | ICD-10-CM

## 2024-04-20 DIAGNOSIS — J45909 Unspecified asthma, uncomplicated: Secondary | ICD-10-CM | POA: Insufficient documentation

## 2024-04-20 DIAGNOSIS — H1013 Acute atopic conjunctivitis, bilateral: Secondary | ICD-10-CM

## 2024-04-20 DIAGNOSIS — J3089 Other allergic rhinitis: Secondary | ICD-10-CM | POA: Diagnosis not present

## 2024-04-20 DIAGNOSIS — H1010 Acute atopic conjunctivitis, unspecified eye: Secondary | ICD-10-CM | POA: Insufficient documentation

## 2024-04-20 MED ORDER — QVAR REDIHALER 80 MCG/ACT IN AERB: 2.0000 | INHALATION_SPRAY | Freq: Two times a day (BID) | RESPIRATORY_TRACT | 1 refills | Status: AC

## 2024-04-20 MED ORDER — PREDNISONE 10 MG PO TABS: ORAL_TABLET | ORAL | 0 refills | Status: AC

## 2024-04-20 MED ORDER — ALBUTEROL SULFATE HFA 108 (90 BASE) MCG/ACT IN AERS: 2.0000 | INHALATION_SPRAY | Freq: Four times a day (QID) | RESPIRATORY_TRACT | 1 refills | Status: AC | PRN

## 2024-04-24 ENCOUNTER — Other Ambulatory Visit (HOSPITAL_BASED_OUTPATIENT_CLINIC_OR_DEPARTMENT_OTHER): Payer: Self-pay

## 2024-04-24 MED ORDER — ZEPBOUND 5 MG/0.5ML ~~LOC~~ SOAJ
5.0000 mg | SUBCUTANEOUS | 1 refills | Status: AC
  Filled 2024-04-24: qty 2, 28d supply, fill #0

## 2024-04-25 ENCOUNTER — Other Ambulatory Visit (HOSPITAL_BASED_OUTPATIENT_CLINIC_OR_DEPARTMENT_OTHER): Payer: Self-pay

## 2024-04-25 MED ORDER — ONDANSETRON HCL 4 MG PO TABS
4.0000 mg | ORAL_TABLET | Freq: Every day | ORAL | 0 refills | Status: AC | PRN
  Filled 2024-04-25: qty 30, 30d supply, fill #0

## 2024-05-24 ENCOUNTER — Other Ambulatory Visit (HOSPITAL_BASED_OUTPATIENT_CLINIC_OR_DEPARTMENT_OTHER): Payer: Self-pay

## 2024-05-24 MED ORDER — ZEPBOUND 5 MG/0.5ML ~~LOC~~ SOAJ
5.0000 mg | SUBCUTANEOUS | 1 refills | Status: AC
  Filled 2024-05-24: qty 2, 28d supply, fill #0
